# Patient Record
Sex: Male | Born: 1955 | Race: White | Hispanic: No | Marital: Married | Smoking: Never smoker
Health system: Southern US, Community
[De-identification: ages and names within clinical notes are randomized; demographics above are authoritative.]

## PROBLEM LIST (undated history)

## (undated) DIAGNOSIS — T8859XA Other complications of anesthesia, initial encounter: Secondary | ICD-10-CM

## (undated) DIAGNOSIS — G4700 Insomnia, unspecified: Secondary | ICD-10-CM

## (undated) DIAGNOSIS — N189 Chronic kidney disease, unspecified: Secondary | ICD-10-CM

## (undated) DIAGNOSIS — T7840XA Allergy, unspecified, initial encounter: Secondary | ICD-10-CM

## (undated) DIAGNOSIS — I340 Nonrheumatic mitral (valve) insufficiency: Secondary | ICD-10-CM

## (undated) DIAGNOSIS — E785 Hyperlipidemia, unspecified: Secondary | ICD-10-CM

## (undated) DIAGNOSIS — R112 Nausea with vomiting, unspecified: Secondary | ICD-10-CM

## (undated) DIAGNOSIS — H269 Unspecified cataract: Secondary | ICD-10-CM

## (undated) DIAGNOSIS — Z9889 Other specified postprocedural states: Secondary | ICD-10-CM

## (undated) HISTORY — DX: Allergy, unspecified, initial encounter: T78.40XA

## (undated) HISTORY — PX: VASECTOMY: SHX75

## (undated) HISTORY — PX: SHOULDER ARTHROSCOPY: SHX128

## (undated) HISTORY — PX: OTHER SURGICAL HISTORY: SHX169

## (undated) HISTORY — DX: Chronic kidney disease, unspecified: N18.9

## (undated) HISTORY — DX: Insomnia, unspecified: G47.00

## (undated) HISTORY — DX: Unspecified cataract: H26.9

## (undated) HISTORY — DX: Hyperlipidemia, unspecified: E78.5

## (undated) HISTORY — PX: TONSILLECTOMY: SUR1361

## (undated) HISTORY — PX: FOOT SURGERY: SHX648

---

## 2008-10-19 ENCOUNTER — Ambulatory Visit: Payer: Self-pay | Admitting: Cardiology

## 2008-10-19 ENCOUNTER — Observation Stay (HOSPITAL_COMMUNITY): Admission: EM | Admit: 2008-10-19 | Discharge: 2008-10-20 | Payer: Self-pay | Admitting: Emergency Medicine

## 2008-10-19 ENCOUNTER — Encounter: Payer: Self-pay | Admitting: Cardiology

## 2008-10-28 ENCOUNTER — Ambulatory Visit: Payer: Self-pay

## 2008-10-28 ENCOUNTER — Encounter: Payer: Self-pay | Admitting: Cardiology

## 2008-11-01 ENCOUNTER — Encounter: Payer: Self-pay | Admitting: Emergency Medicine

## 2008-11-10 DIAGNOSIS — E785 Hyperlipidemia, unspecified: Secondary | ICD-10-CM

## 2008-11-11 ENCOUNTER — Ambulatory Visit: Payer: Self-pay | Admitting: Emergency Medicine

## 2008-11-11 ENCOUNTER — Encounter: Payer: Self-pay | Admitting: Emergency Medicine

## 2008-11-11 DIAGNOSIS — R0602 Shortness of breath: Secondary | ICD-10-CM

## 2008-11-14 ENCOUNTER — Telehealth: Payer: Self-pay | Admitting: Cardiology

## 2008-11-22 DIAGNOSIS — R079 Chest pain, unspecified: Secondary | ICD-10-CM | POA: Insufficient documentation

## 2008-11-23 ENCOUNTER — Ambulatory Visit: Payer: Self-pay | Admitting: Cardiology

## 2009-01-17 ENCOUNTER — Encounter (INDEPENDENT_AMBULATORY_CARE_PROVIDER_SITE_OTHER): Payer: Self-pay | Admitting: *Deleted

## 2009-04-07 ENCOUNTER — Encounter: Payer: Self-pay | Admitting: Emergency Medicine

## 2010-08-14 LAB — CBC
MCHC: 34.3 g/dL (ref 30.0–36.0)
Platelets: 263 10*3/uL (ref 150–400)
RDW: 12.7 % (ref 11.5–15.5)

## 2010-08-14 LAB — BASIC METABOLIC PANEL
BUN: 12 mg/dL (ref 6–23)
Calcium: 8.6 mg/dL (ref 8.4–10.5)
Creatinine, Ser: 0.9 mg/dL (ref 0.4–1.5)
GFR calc non Af Amer: >60 mL/min (ref >60)
Potassium: 4.3 mEq/L (ref 3.5–5.1)

## 2010-08-14 LAB — POCT I-STAT 3, ART BLOOD GAS (G3+)
Acid-base deficit: 1 mmol/L (ref 0.0–2.0)
O2 Saturation: 88 %
TCO2: 25 mmol/L (ref 0–100)
pCO2 arterial: 39.6 mmHg (ref 35.0–45.0)
pO2, Arterial: 56 mmHg — ABNORMAL LOW (ref 80.0–100.0)

## 2010-08-14 LAB — COMPREHENSIVE METABOLIC PANEL
AST: 29 U/L (ref 0–37)
Albumin: 4.2 g/dL (ref 3.5–5.2)
Alkaline Phosphatase: 89 U/L (ref 39–117)
BUN: 11 mg/dL (ref 6–23)
Chloride: 106 mEq/L (ref 96–112)
Creatinine, Ser: 0.82 mg/dL (ref 0.4–1.5)
GFR calc Af Amer: >60 mL/min (ref >60)
Potassium: 4.2 mEq/L (ref 3.5–5.1)
Total Bilirubin: 0.4 mg/dL (ref 0.3–1.2)
Total Protein: 7 g/dL (ref 6.0–8.3)

## 2010-08-14 LAB — POCT CARDIAC MARKERS
CKMB, poc: <1 ng/mL — ABNORMAL LOW (ref 1.0–8.0)
Myoglobin, poc: 43.2 ng/mL (ref 12–200)
Troponin i, poc: <0.05 ng/mL (ref 0.00–0.09)
Troponin i, poc: <0.05 ng/mL (ref 0.00–0.09)

## 2010-08-14 LAB — LIPID PANEL
HDL: 37 mg/dL — ABNORMAL LOW (ref >39)
Total CHOL/HDL Ratio: 5.5 RATIO
Triglycerides: 132 mg/dL (ref <150)
VLDL: 26 mg/dL (ref 0–40)

## 2010-08-14 LAB — CARDIAC PANEL(CRET KIN+CKTOT+MB+TROPI)
CK, MB: 0.8 ng/mL (ref 0.3–4.0)
CK, MB: 1.1 ng/mL (ref 0.3–4.0)
Relative Index: INVALID (ref 0.0–2.5)
Total CK: 80 U/L (ref 7–232)
Troponin I: 0.02 ng/mL (ref 0.00–0.06)
Troponin I: 0.02 ng/mL (ref 0.00–0.06)

## 2010-08-14 LAB — DIFFERENTIAL
Basophils Absolute: 0 10*3/uL (ref 0.0–0.1)
Basophils Relative: 0 % (ref 0–1)
Lymphocytes Relative: 21 % (ref 12–46)
Neutro Abs: 5.1 10*3/uL (ref 1.7–7.7)

## 2010-08-14 LAB — BRAIN NATRIURETIC PEPTIDE: Pro B Natriuretic peptide (BNP): <30 pg/mL (ref 0.0–100.0)

## 2010-08-14 LAB — ETHANOL: Alcohol, Ethyl (B): <5 mg/dL (ref 0–10)

## 2010-08-14 LAB — TROPONIN I: Troponin I: 0.01 ng/mL (ref 0.00–0.06)

## 2010-08-14 LAB — CK TOTAL AND CKMB (NOT AT ARMC)
Relative Index: INVALID (ref 0.0–2.5)
Total CK: 76 U/L (ref 7–232)

## 2010-09-19 NOTE — Discharge Summary (Signed)
NAME:  Oscar [name], Oscar [name]             ACCOUNT NO.:  [id number]   MEDICAL RECORD NO.:  [id number]          PATIENT TYPE:  INP   LOCATION:  6525                         FACILITY:  [facility]   PHYSICIAN:  Arturo Morton. Riley Kill, MD, FACCDATE OF BIRTH:  Dec 24, 1955   DATE OF ADMISSION:  10/19/2008  DATE OF DISCHARGE:  10/20/2008                               DISCHARGE SUMMARY   PROCEDURES:  CT angiogram of the chest.   PRIMARY FINAL DISCHARGE DIAGNOSIS:  Shortness of breath.   SECONDARY DIAGNOSES:  1. Chest pain.  2. Hyperlipidemia with a total cholesterol of 204, triglycerides 132,      HDL 31, LDL 141.   TIME OF DISCHARGE:  32 minutes.   HOSPITAL COURSE:  Mr. Glazier is a 55 year old male with a history of  coronary artery disease.  He had some dyspnea on exertion, which  progressed to dyspnea with minimal exertion.  He also had some sharp  chest pain and came to the hospital where he was admitted for further  evaluation and treatment.  Because of his shortness of breath and a  concern for pulmonary embolism, a CT angiogram of the chest was  performed.  There was no evidence of acute pulmonary embolism or other  acute chest process.  A chest x-ray showed minimally enlarged cardiac  silhouette with no acute disease.  Lipid profile as described above.  His O2 saturation was 95% on room air.  A blood gas showed a pH of  7.386, pCO2 39.6, pO2 56, and bicarb of 23.7.   On October 20, 2008, his TSH was within normal limits.  His BNP was normal  and a D-dimer was less than 0.22.  Dr. Riley Kill was not clear on the  etiology of the dyspnea, but there were no obvious laboratory  abnormalities to explain it.  His hemoglobin and hematocrit were within  normal limits and his blood sugar was only minimally elevated of 102.  Dr. Riley Kill felt that Mr. Velaquez could be safely discharged home with  an outpatient stress echo and then follow up.   DISCHARGE INSTRUCTIONS:  1. His activity level is to be  increased gradually.  2. He is to stick to a low-sodium heart-healthy diet.  3. He is to follow up with Dr. Cleta Alberts as needed and with Dr. Riley Kill on      November 16, 2008 at 4:30.  4. He is to get a stress echo on October 28, 2008 at 11:30 a.m.   DISCHARGE MEDICATIONS:  Aspirin 81 mg daily.      Theodore Demark, PA-C      Arturo Morton. Riley Kill, MD, [facility]  Electronically Signed    RB/MEDQ  D:  10/20/2008  T:  10/20/2008  Job:  [id number]   cc:   Brett Canales A. Cleta Alberts, M.D.

## 2010-09-19 NOTE — H&P (Signed)
NAME:  Oscar [name], Oscar [name]             ACCOUNT NO.:  [id number]   MEDICAL RECORD NO.:  [id number]          PATIENT TYPE:  INP   LOCATION:  6525                         FACILITY:  [facility]   PHYSICIAN:  Arturo Morton. Riley Kill, MD, FACCDATE OF BIRTH:  1956-02-17   DATE OF ADMISSION:  10/19/2008  DATE OF DISCHARGE:                              HISTORY & PHYSICAL   PRIMARY CARDIOLOGIST:  Rocky Crafts. Riley Kill, MD, [facility]   PRIMARY CARE PHYSICIAN:  The patient does not have one.   HISTORY OF PRESENT ILLNESS:  This is a 55 year old Caucasian male with  no prior history of CAD or any other past medical history with  increasing fatigue and shortness of breath with exertion over the last 6  weeks, doing yard work; however, the patient does walk on a treadmill 4  times a week and does not have any trouble with chest discomfort or  fatigue at that time.  Upon going to bed last night, he could not catch  his breath, he took his shower, took some baby aspirin, the shortness of  breath did not ease.  He was unable to sleep secondary to this.  He  would sit up in bed or reposition himself, which did not help.  This  a.m., his breathing issue got a little worse and he came to the  emergency room after being seen at Urgent [facility] where his wife works.  EKG does not show any specific ST-T wave abnormalities.  He did have  some frequent PVCs.  Other than that the patient had some complaints of  jabbing in the center of his chest x1 week ago, was transient, and  currently he states this feels more like pressure each time he takes a  deep breath and cannot get enough air.  He is currently comfortable on 2  L, does have continued short shortness of breath and chest discomfort,  but does not appear to be acutely ill and is breathing and able to talk  without shortness of breath at this time.   REVIEW OF SYSTEMS:  Shortness of breath, dyspnea on exertion, fatigue,  lack of energy, and one episode of transient chest  pain.  All other  systems are reviewed and found to be negative.   PAST MEDICAL HISTORY:  Negative.   SOCIAL HISTORY:  He lives in [place] with his wife.  He works for  [employer] for 30 years.  He is married.  He does not smoke.  Occasional alcohol use.  No drug use.   FAMILY HISTORY:  Mother deceased with cancer.  Father alive with  prostate cancer.  He has a sibling, who smokes heavily, but has no  health issues.   CURRENT MEDICATIONS AT HOME:  None.   ALLERGIES:  None.   CURRENT LABORATORIES:  Sodium 140, potassium 4.2, chloride 106, CO2 of  26, glucose 109, BUN 11, creatinine 0.82, and calcium 9.0.  LFTs are  within normal limits.  Point-of-care troponin less than 0.05.  Chest x-  ray revealing mildly enlarged cardiac silhouette, and no flare up of  pulmonary edema, pneumonia, or pleural effusion.  EKG revealing normal  sinus rhythm with occasional PVCs and bigeminy.  No acute ST-T wave  changes are noted.   PHYSICAL EXAMINATION:  VITAL SIGNS:  Blood pressure 125/90, pulse 77,  respirations 20, he is afebrile, and O2 sat 93% on 2 L.  GENERAL:  He is awake, alert, and oriented.  No acute distress.  HEENT:  Head is normocephalic and atraumatic.  Eyes, PERRLA.  Mucous  membranes of mouth pink and moist.  Tongue is midline.  NECK:  Supple.  No JVD.  No carotid bruits are appreciated at present.  CARDIOVASCULAR:  Regular rate and rhythm without murmurs, rubs, or  gallops.  LUNGS:  Clear to auscultation without wheezes, rales, or rhonchi.  ABDOMEN:  Obese and nontender, 2+ bowel sounds.  EXTREMITIES:  Without clubbing, cyanosis, or edema.  NEUROLOGIC:  Cranial nerves II through XII are grossly intact.  PSYCH:  The patient minimizes pain, but has good affect.   IMPRESSION:  1. Dyspnea.  2. Chest pressure.   PLAN:  This is a 55 year old Caucasian male without prior history of CAD  with 6 weeks of increasing fatigue, dyspnea on exertion, and shortness  of breath  last night, prompting ER admit.  The patient does walk on a  treadmill 3-4 times a week with no associated symptoms.  However, this  afternoon, he has had some chest pressure.  Last evening, he had chest  pressure and not feeling like he can take a deep breath.  EKG shows some  inferior T-wave flattening with lateral flattening, which are  nonspecific.  The patient has been seen and examined by myself and Dr.  Shawnie Pons.  We will admit, risk stratification, and check lipids.  We will start aspirin, check ABG on room air, consider stress Myoview  versus cath versus CT scan.  The patient does not have any  cardiovascular risk factors at present.  We will do further testing and  make further recommendations throughout hospital course to include an  echocardiogram.      Bettey Mare. Lyman Bishop, NP      Arturo Morton. Riley Kill, MD, [facility]  Electronically Signed    KML/MEDQ  D:  10/19/2008  T:  10/20/2008  Job:  [id number]

## 2010-12-04 ENCOUNTER — Encounter: Payer: Self-pay | Admitting: Internal Medicine

## 2010-12-13 ENCOUNTER — Encounter: Payer: Self-pay | Admitting: Internal Medicine

## 2010-12-13 ENCOUNTER — Ambulatory Visit (AMBULATORY_SURGERY_CENTER): Payer: 59 | Admitting: *Deleted

## 2010-12-13 VITALS — Ht 74.0 in | Wt 236.2 lb

## 2010-12-13 DIAGNOSIS — Z1211 Encounter for screening for malignant neoplasm of colon: Secondary | ICD-10-CM

## 2010-12-13 MED ORDER — PEG-KCL-NACL-NASULF-NA ASC-C 100 G PO SOLR: 1.0000 | Freq: Once | ORAL | Status: DC

## 2010-12-18 ENCOUNTER — Telehealth: Payer: Self-pay | Admitting: Internal Medicine

## 2010-12-18 NOTE — Telephone Encounter (Signed)
Pt calls because no prep called yet to [facility]. I have call [facility] and ordered a Movie prep.

## 2010-12-26 ENCOUNTER — Telehealth: Payer: Self-pay | Admitting: Internal Medicine

## 2010-12-26 NOTE — Telephone Encounter (Signed)
No answer.  Message left on wife's voice mail. Oscar [name]

## 2010-12-26 NOTE — Telephone Encounter (Signed)
Pt's questions were answered about taking prep am of procedure. Oscar [name]

## 2010-12-27 ENCOUNTER — Ambulatory Visit (AMBULATORY_SURGERY_CENTER): Payer: 59 | Admitting: Internal Medicine

## 2010-12-27 ENCOUNTER — Encounter: Payer: Self-pay | Admitting: Internal Medicine

## 2010-12-27 DIAGNOSIS — D126 Benign neoplasm of colon, unspecified: Secondary | ICD-10-CM

## 2010-12-27 DIAGNOSIS — Z1211 Encounter for screening for malignant neoplasm of colon: Secondary | ICD-10-CM

## 2010-12-27 MED ORDER — SODIUM CHLORIDE 0.9 % IV SOLN: 500.0000 mL | INTRAVENOUS | Status: DC

## 2010-12-27 NOTE — Patient Instructions (Signed)
Discharge instructions reviewed with patient and care partner.  Impressions/Recommedations:  Polyps (information sheet given)  Please continue medications as you were taking them prior to your procedure.

## 2010-12-28 ENCOUNTER — Telehealth: Payer: Self-pay | Admitting: *Deleted

## 2010-12-28 NOTE — Telephone Encounter (Signed)

## 2011-01-01 ENCOUNTER — Encounter: Payer: Self-pay | Admitting: Internal Medicine

## 2011-06-18 ENCOUNTER — Ambulatory Visit (INDEPENDENT_AMBULATORY_CARE_PROVIDER_SITE_OTHER): Payer: 59 | Admitting: Physician Assistant

## 2011-06-18 VITALS — BP 124/79 | HR 86 | Temp 98.6°F | Resp 16 | Ht 73.0 in | Wt 224.0 lb

## 2011-06-18 DIAGNOSIS — G4700 Insomnia, unspecified: Secondary | ICD-10-CM

## 2011-06-18 DIAGNOSIS — M7710 Lateral epicondylitis, unspecified elbow: Secondary | ICD-10-CM

## 2011-06-18 MED ORDER — METHYLPREDNISOLONE ACETATE 80 MG/ML IJ SUSP
80.0000 mg | Freq: Once | INTRAMUSCULAR | Status: AC
  Administered 2011-06-18: 80 mg via INTRAMUSCULAR

## 2011-06-18 MED ORDER — PREDNISONE 20 MG PO TABS: ORAL_TABLET | ORAL | Status: AC

## 2011-06-18 MED ORDER — CLONAZEPAM 0.5 MG PO TABS: ORAL_TABLET | ORAL | Status: DC

## 2011-06-18 NOTE — Patient Instructions (Signed)
Lateral Epicondylitis (Tennis Elbow) with Rehab Lateral epicondylitis involves inflammation and pain around the outer portion of the elbow. The pain is caused by inflammation of the tendons in the forearm that bring back (extend) the wrist. Lateral epicondylittis is also called tennis elbow, because it is very common in tennis players. However, it may occur in any individual who extends the wrist repetitively. If lateral epicondylitis is left untreated, it may become a chronic problem. SYMPTOMS   Pain, tenderness, and inflammation on the outer (lateral) side of the elbow.   Pain or weakness with gripping activities.   Pain that increases with wrist twisting motions (playing tennis, using a screwdriver, opening a door or a jar).   Pain with lifting objects, including a coffee cup.  CAUSES  Lateral epicondylitis is caused by inflammation of the tendons that extend the wrist. Causes of injury may include:  Repetitive stress and strain on the muscles and tendons that extend the wrist.   Sudden change in activity level or intensity.   Incorrect grip in racquet sports.   Incorrect grip size of racquet (often too large).   Incorrect hitting position or technique (usually backhand, leading with the elbow).   Using a racket that is too heavy.  RISK INCREASES WITH:  Sports or occupations that require repetitive and/or strenuous forearm and wrist movements (tennis, squash, racquetball, carpentry).   Poor wrist and forearm strength and flexibility.   Failure to warm up properly before activity.   Resuming activity before healing, rehabilitation, and conditioning are complete.  PREVENTION   Warm up and stretch properly before activity.   Maintain physical fitness:   Strength, flexibility, and endurance.   Cardiovascular fitness.   Wear and use properly fitted equipment.   Learn and use proper technique and have a coach correct improper technique.   Wear a tennis elbow  (counterforce) brace.  PROGNOSIS  The course of this condition depends on the degree of the injury. If treated properly, acute cases (symptoms lasting less than 4 weeks) are often resolved in 2 to 6 weeks. Chronic (longer lasting cases) often resolve in 3 to 6 months, but may require physical therapy. RELATED COMPLICATIONS   Frequently recurring symptoms, resulting in a chronic problem. Properly treating the problem the first time decreases frequency of recurrence.   Chronic inflammation, scarring tendon degeneration, and partial tendon tear, requiring surgery.   Delayed healing or resolution of symptoms.  TREATMENT  Treatment first involves the use of ice and medicine, to reduce pain and inflammation. Strengthening and stretching exercises may help reduce discomfort, if performed regularly. These exercises may be performed at home, if the condition is an acute injury. Chronic cases may require a referral to a physical therapist for evaluation and treatment. Your caregiver may advise a corticosteroid injection, to help reduce inflammation. Rarely, surgery is needed. MEDICATION  If pain medicine is needed, nonsteroidal anti-inflammatory medicines (aspirin and ibuprofen), or other minor pain relievers (acetaminophen), are often advised.   Do not take pain medicine for 7 days before surgery.   Prescription pain relievers may be given, if your caregiver thinks they are needed. Use only as directed and only as much as you need.   Corticosteroid injections may be recommended. These injections should be reserved only for the most severe cases, because they can only be given a certain number of times.  HEAT AND COLD  Cold treatment (icing) should be applied for 10 to 15 minutes every 2 to 3 hours for inflammation and pain, and immediately   after activity that aggravates your symptoms. Use ice packs or an ice massage.   Heat treatment may be used before performing stretching and strengthening  activities prescribed by your caregiver, physical therapist, or athletic trainer. Use a heat pack or a warm water soak.  SEEK MEDICAL CARE IF: Symptoms get worse or do not improve in 2 weeks, despite treatment. EXERCISES  RANGE OF MOTION (ROM) AND STRETCHING EXERCISES - Epicondylitis, Lateral (Tennis Elbow) These exercises may help you when beginning to rehabilitate your injury. Your symptoms may go away with or without further involvement from your physician, physical therapist or athletic trainer. While completing these exercises, remember:   Restoring tissue flexibility helps normal motion to return to the joints. This allows healthier, less painful movement and activity.   An effective stretch should be held for at least 30 seconds.   A stretch should never be painful. You should only feel a gentle lengthening or release in the stretched tissue.  RANGE OF MOTION - Wrist Flexion, Active-Assisted  Extend your right / left elbow with your fingers pointing down.*   Gently pull the back of your hand towards you, until you feel a gentle stretch on the top of your forearm.   Hold this position for __________ seconds.  Repeat __________ times. Complete this exercise __________ times per day.  *If directed by your physician, physical therapist or athletic trainer, complete this stretch with your elbow bent, rather than extended. RANGE OF MOTION - Wrist Extension, Active-Assisted  Extend your right / left elbow and turn your palm upwards.*   Gently pull your palm and fingertips back, so your wrist extends and your fingers point more toward the ground.   You should feel a gentle stretch on the inside of your forearm.   Hold this position for __________ seconds.  Repeat __________ times. Complete this exercise __________ times per day. *If directed by your physician, physical therapist or athletic trainer, complete this stretch with your elbow bent, rather than extended. STRETCH - Wrist  Flexion  Place the back of your right / left hand on a tabletop, leaving your elbow slightly bent. Your fingers should point away from your body.   Gently press the back of your hand down onto the table by straightening your elbow. You should feel a stretch on the top of your forearm.   Hold this position for __________ seconds.  Repeat __________ times. Complete this stretch __________ times per day.  STRETCH - Wrist Extension   Place your right / left fingertips on a tabletop, leaving your elbow slightly bent. Your fingers should point backwards.   Gently press your fingers and palm down onto the table by straightening your elbow. You should feel a stretch on the inside of your forearm.   Hold this position for __________ seconds.  Repeat __________ times. Complete this stretch __________ times per day.  STRENGTHENING EXERCISES - Epicondylitis, Lateral (Tennis Elbow) These exercises may help you when beginning to rehabilitate your injury. They may resolve your symptoms with or without further involvement from your physician, physical therapist or athletic trainer. While completing these exercises, remember:   Muscles can gain both the endurance and the strength needed for everyday activities through controlled exercises.   Complete these exercises as instructed by your physician, physical therapist or athletic trainer. Increase the resistance and repetitions only as guided.   You may experience muscle soreness or fatigue, but the pain or discomfort you are trying to eliminate should never worsen during these exercises. If   this pain does get worse, stop and make sure you are following the directions exactly. If the pain is still present after adjustments, discontinue the exercise until you can discuss the trouble with your caregiver.  STRENGTH - Wrist Flexors  Sit with your right / left forearm palm-up and fully supported on a table or countertop. Your elbow should be resting below the  height of your shoulder. Allow your wrist to extend over the edge of the surface.   Loosely holding a __________ weight, or a piece of rubber exercise band or tubing, slowly curl your hand up toward your forearm.   Hold this position for __________ seconds. Slowly lower the wrist back to the starting position in a controlled manner.  Repeat __________ times. Complete this exercise __________ times per day.  STRENGTH - Wrist Extensors  Sit with your right / left forearm palm-down and fully supported on a table or countertop. Your elbow should be resting below the height of your shoulder. Allow your wrist to extend over the edge of the surface.   Loosely holding a __________ weight, or a piece of rubber exercise band or tubing, slowly curl your hand up toward your forearm.   Hold this position for __________ seconds. Slowly lower the wrist back to the starting position in a controlled manner.  Repeat __________ times. Complete this exercise __________ times per day.  STRENGTH - Ulnar Deviators  Stand with a ____________________ weight in your right / left hand, or sit while holding a rubber exercise band or tubing, with your healthy arm supported on a table or countertop.   Move your wrist, so that your pinkie travels toward your forearm and your thumb moves away from your forearm.   Hold this position for __________ seconds and then slowly lower the wrist back to the starting position.  Repeat __________ times. Complete this exercise __________ times per day STRENGTH - Radial Deviators  Stand with a ____________________ weight in your right / left hand, or sit while holding a rubber exercise band or tubing, with your injured arm supported on a table or countertop.   Raise your hand upward in front of you or pull up on the rubber tubing.   Hold this position for __________ seconds and then slowly lower the wrist back to the starting position.  Repeat __________ times. Complete this  exercise __________ times per day. STRENGTH - Forearm Supinators   Sit with your right / left forearm supported on a table, keeping your elbow below shoulder height. Rest your hand over the edge, palm down.   Gently grip a hammer or a soup ladle.   Without moving your elbow, slowly turn your palm and hand upward to a "thumbs-up" position.   Hold this position for __________ seconds. Slowly return to the starting position.  Repeat __________ times. Complete this exercise __________ times per day.  STRENGTH - Forearm Pronators   Sit with your right / left forearm supported on a table, keeping your elbow below shoulder height. Rest your hand over the edge, palm up.   Gently grip a hammer or a soup ladle.   Without moving your elbow, slowly turn your palm and hand upward to a "thumbs-up" position.   Hold this position for __________ seconds. Slowly return to the starting position.  Repeat __________ times. Complete this exercise __________ times per day.  STRENGTH - Grip  Grasp a tennis ball, a dense sponge, or a large, rolled sock in your hand.   Squeeze as hard   as you can, without increasing any pain.   Hold this position for __________ seconds. Release your grip slowly.  Repeat __________ times. Complete this exercise __________ times per day.  STRENGTH - Elbow Extensors, Isometric  Stand or sit upright, on a firm surface. Place your right / left arm so that your palm faces your stomach, and it is at the height of your waist.   Place your opposite hand on the underside of your forearm. Gently push up as your right / left arm resists. Push as hard as you can with both arms, without causing any pain or movement at your right / left elbow. Hold this stationary position for __________ seconds.  Gradually release the tension in both arms. Allow your muscles to relax completely before repeating. Document Released: 04/23/2005 Document Revised: 01/03/2011 Document Reviewed:  08/05/2008 [employer] Patient Information 2012 [employer]. 

## 2011-06-18 NOTE — Progress Notes (Signed)
Patient ID: Oscar [name] MRN: [id number], DOB: Aug 07, 1955, 56 y.o. Date of Encounter: 06/18/2011, 8:32 AM  Primary Physician: No primary provider on file.  Chief Complaint: Insomnia, long standing and left elbow pain for 9 months  HPI: 56 year old Caucasian male with history below presents with 2 complaints. 1. Long standing history of insomnia. Has been worsening over the past month. Getting 2-3 hours of sleep per night. Sometimes difficultly falling asleep and other times difficulty staying asleep. Has tried Ambien 5 mg, 10 mg, 12.5 mg, Lunesta, and OTC products without success. The OTC products leave him feeling groggy in the morning. His insomnia is causing his spouse to lose sleep at this point as well. Requests different treatment.  2. Patient also mentions 9 month history of off and on right lateral elbow discomfort. He attributes this discomfort to moving numerous 10-15 pound rocks with his father 9 months prior. Since that time if he utilizes the elbow a lot during the day it becomes sore. If he does not use it much there is not pain associated with the elbow. Has tried Motrin and a sock used as a tennis elbow strap with good results, but has not stuck with the program. Does not have any bone pain. Has full range of motion. No wound.    Past Medical History  Diagnosis Date  . Insomnia   . Hyperlipidemia      Home Meds: Prior to Admission medications   Medication Sig Start Date End Date Taking? Authorizing Provider  clonazePAM (KLONOPIN) 0.5 MG tablet 1/2 to 2 po qhs prn insomnia 06/18/11   [name], PA-C  Multiple Vitamin (MULTIVITAMIN) tablet Take 1 tablet by mouth daily.   Yes Historical Provider, MD  Omega-3 Fatty Acids (FISH OIL) 500 MG CAPS Take by mouth daily.   Yes Historical Provider, MD  predniSONE (DELTASONE) 20 MG tablet 3 PO FOR 3 DAYS, 2 PO FOR 3 DAYS, 1 PO FOR 3 DAYS 06/18/11 06/28/11  [name], PA-C    Allergies: No Known Allergies  History   Social History    . Marital Status: Married    Spouse Name: N/A    Number of Children: N/A  . Years of Education: N/A   Occupational History  . Not on file.   Social History Main Topics  . Smoking status: Never Smoker   . Smokeless tobacco: Not on file  . Alcohol Use: Not on file  . Drug Use: Not on file  . Sexually Active: Not on file   Other Topics Concern  . Not on file   Social History Narrative  . No narrative on file     Review of Systems: Constitutional: negative for chills, fever, night sweats, weight changes, or fatigue  HEENT: negative for vision changes, hearing loss, congestion, rhinorrhea, ST, epistaxis, or sinus pressure Cardiovascular: negative for chest pain or palpitations Respiratory: negative for hemoptysis, wheezing, shortness of breath, or cough Abdominal: negative for abdominal pain, nausea, vomiting, diarrhea, or constipation Dermatological: negative for rash Neurologic: negative for headache, dizziness, or syncope All other systems reviewed and are otherwise negative with the exception to those above and in the HPI.   Physical Exam: Blood pressure 124/79, pulse 86, temperature 98.6 F (37 C), temperature source Oral, resp. rate 16, height 6\' 1"  (1.854 m), weight 224 lb (101.606 kg). Body mass index is 29.55 kg/(m^2). General: Well developed, well nourished, in no acute distress. Head: Normocephalic, atraumatic, eyes without discharge, sclera non-icteric, nares are without discharge. Bilateral auditory  canals clear, TM's are without perforation, pearly grey and translucent with reflective cone of light bilaterally. Oral cavity moist, posterior pharynx without exudate, erythema, peritonsillar abscess, or post nasal drip.  Neck: Supple. No thyromegaly. Full ROM. No lymphadenopathy. Lungs: Breathing is unlabored. Heart: Normal rate Msk:  Strength and tone normal for age. Right elbow: No wound, FROM, 5/5 strength, no tenderness to palpation. Patient states his discomfort  is located along right anterior portion of the lateral epicondyle.  Extremities/Skin: Warm and dry. No clubbing or cyanosis. No edema. No rashes or suspicious lesions. Normal sensation. Neuro: Alert and oriented X 3. Moves all extremities spontaneously. Gait is normal. CNII-XII grossly in tact. Psych:  Responds to questions appropriately with a normal affect.   X-ray: Declined  ASSESSMENT AND PLAN:  56 y.o. Caucasian male with insomnia and lateral epicondylitis. 1. Insomnia -Klonopin 0.5 mg #60 1/2 to 1 po qhs prn insomnia no rf SED -Good sleep hygiene -Call with update  2. Lateral epicondylitis -DepoMedrol 80 mg IM -Prednisone 20 mg #18 3x3, 2x3, 1x3 RF#1 SED -Tennis elbow strap -ROM exercises -If persists recommend PT and plain films   Signed,  [name], PA-C 06/18/2011 8:32 AM

## 2011-08-01 ENCOUNTER — Other Ambulatory Visit: Payer: Self-pay | Admitting: Physician Assistant

## 2011-08-01 MED ORDER — CLONAZEPAM 0.5 MG PO TABS: ORAL_TABLET | ORAL | Status: DC

## 2011-08-01 MED ORDER — AMITRIPTYLINE HCL 25 MG PO TABS: 25.0000 mg | ORAL_TABLET | Freq: Every day | ORAL | Status: DC

## 2011-08-01 NOTE — Telephone Encounter (Signed)
See scanned in refill request. Patient requests refill of Klonopin and added medication to help him stay asleep. Will have a trial of continued Klonopin and add Elavil.   [name], PA-C 08/01/2011 2:28 PM

## 2011-08-15 ENCOUNTER — Encounter: Payer: Self-pay | Admitting: Internal Medicine

## 2011-09-04 ENCOUNTER — Other Ambulatory Visit: Payer: Self-pay | Admitting: Physician Assistant

## 2011-09-04 MED ORDER — ZOLPIDEM TARTRATE ER 12.5 MG PO TBCR: 12.5000 mg | EXTENDED_RELEASE_TABLET | Freq: Every evening | ORAL | Status: DC | PRN

## 2011-09-04 MED ORDER — CLONAZEPAM 0.5 MG PO TABS: ORAL_TABLET | ORAL | Status: DC

## 2011-09-04 NOTE — Progress Notes (Signed)
Patient's wife states he would like a refill of both his Klonopin 0.5 mg and Ambien 10 mg. He will take the Klonopin 0.5 mg 2 po qhs for about 4 days, then change over to the Ambien 10 mg for the next couple of days. He is not taking them together. This combination is working well for him. The Elavil made him to tired in the morning. He is planning to come in for a physical in July 2013.  Please call in both medications to the [facility]  [name], PA-C 09/04/2011 9:20 AM

## 2011-11-07 ENCOUNTER — Other Ambulatory Visit: Payer: Self-pay

## 2011-11-07 MED ORDER — CLONAZEPAM 0.5 MG PO TABS: ORAL_TABLET | ORAL | Status: DC

## 2011-11-19 ENCOUNTER — Ambulatory Visit: Payer: 59 | Admitting: Physician Assistant

## 2011-11-19 ENCOUNTER — Encounter: Payer: Self-pay | Admitting: Physician Assistant

## 2011-11-19 VITALS — BP 127/77 | HR 90 | Temp 97.8°F | Resp 16 | Ht 73.0 in | Wt 232.0 lb

## 2011-11-19 DIAGNOSIS — Z0000 Encounter for general adult medical examination without abnormal findings: Secondary | ICD-10-CM

## 2011-11-19 LAB — LIPID PANEL
HDL: 56 mg/dL (ref >39)
Total CHOL/HDL Ratio: 3.8 Ratio
VLDL: 19 mg/dL (ref 0–40)

## 2011-11-19 LAB — CBC
HCT: 46.7 % (ref 39.0–52.0)
MCH: 33.9 pg (ref 26.0–34.0)
MCHC: 35.8 g/dL (ref 30.0–36.0)
MCV: 94.9 fL (ref 78.0–100.0)
Platelets: 248 10*3/uL (ref 150–400)
RDW: 13 % (ref 11.5–15.5)
WBC: 5.9 10*3/uL (ref 4.0–10.5)

## 2011-11-19 LAB — COMPREHENSIVE METABOLIC PANEL
ALT: 60 U/L — ABNORMAL HIGH (ref 0–53)
AST: 34 U/L (ref 0–37)
Alkaline Phosphatase: 78 U/L (ref 39–117)
BUN: 15 mg/dL (ref 6–23)
Creat: 0.86 mg/dL (ref 0.50–1.35)
Total Bilirubin: 0.6 mg/dL (ref 0.3–1.2)

## 2011-11-19 LAB — POCT UA - MICROSCOPIC ONLY
Casts, Ur, LPF, POC: NEGATIVE
Crystals, Ur, HPF, POC: NEGATIVE
Epithelial cells, urine per micros: NEGATIVE

## 2011-11-19 LAB — POCT URINALYSIS DIPSTICK
Blood, UA: NEGATIVE
Protein, UA: NEGATIVE
Spec Grav, UA: 1.02
Urobilinogen, UA: 0.2
pH, UA: 5.5

## 2011-11-19 NOTE — Progress Notes (Signed)
Patient ID: Oscar [name] MRN: [id number], DOB: 1955-09-19 56 y.o. Date of Encounter: 11/19/2011, 2:13 PM  Primary Physician: No primary provider on file.  Chief Complaint: Physical (CPE)  HPI: 56 y.o. y/o male with history noted below here for CPE. Doing well. No issues/complaints. Insomnia is improving. Taking Klonopin almost nightly, but alternates every two or three nights with Ambien. When he takes Ambien he does not take the Klonopin. Also taking OTC vitamin B6, B12, and Zinc to help with sleeping. He states this combination of vitamins helps quite a lot with his sleep hygiene. He does not currently need a refill of medication.   Had colonoscopy in 2012, normal.   See scanned in blue sheet.  Review of Systems: Consitutional: No fever, chills, fatigue, night sweats, lymphadenopathy, or weight changes. Eyes: No visual changes, eye redness, or discharge. ENT/Mouth: Ears: No otalgia, tinnitus, hearing loss, discharge. Nose: No congestion, rhinorrhea, sinus pain, or epistaxis. Throat: No sore throat, post nasal drip, or teeth pain. Cardiovascular: No CP, palpitations, diaphoresis, DOE, edema, orthopnea, PND. Respiratory: No cough, hemoptysis, SOB, or wheezing. Gastrointestinal: No anorexia, dysphagia, reflux, pain, nausea, vomiting, hematemesis, diarrhea, constipation, BRBPR, or melena. Genitourinary: No dysuria, frequency, urgency, hematuria, incontinence, nocturia, decreased urinary stream, discharge, impotence, or testicular pain/masses. Musculoskeletal: No decreased ROM, myalgias, stiffness, joint swelling, or weakness. Skin: No rash, erythema, lesion changes, pain, warmth, jaundice, or pruritis. Neurological: No headache, dizziness, syncope, seizures, tremors, memory loss, coordination problems, or paresthesias. Psychological: No anxiety, depression, hallucinations, SI/HI. Endocrine: No fatigue, polydipsia, polyphagia, polyuria, or known diabetes. All other systems were reviewed  and are otherwise negative.  Past Medical History  Diagnosis Date  . Allergy   . Chronic kidney disease     [id number]  . Insomnia   . Hyperlipidemia      Past Surgical History  Procedure Date  . Thumb surgery     right  . Foot surgery     right  . Shoulder arthroscopy     Home Meds:  Prior to Admission medications   Medication Sig Start Date End Date Taking? Authorizing Provider  Ascorbic Acid (VITAMIN C) 1000 MG tablet Take 1,000 mg by mouth daily.     Yes Historical Provider, MD  clonazePAM (KLONOPIN) 0.5 MG tablet 1-2 po qhs prn insomnia. 11/07/11  Yes Quintavius Niebuhr M Kunio Cummiskey, PA-C  GLUCOSAMINE-CHONDROITIN PO Take 1 tablet by mouth daily.     Yes Historical Provider, MD  Misc Natural Products (GINSENG COMPLEX PO) Take 1 tablet by mouth daily.     Yes Historical Provider, MD  Multiple Vitamin (MULTIVITAMIN PO) Take 1 tablet by mouth daily.     Yes Historical Provider, MD  Multiple Vitamin (MULTIVITAMIN) tablet Take 1 tablet by mouth daily.   Yes Historical Provider, MD  Omega-3 Fatty Acids (FISH OIL PO) Take 1 tablet by mouth daily.     Yes Historical Provider, MD  Omega-3 Fatty Acids (FISH OIL) 500 MG CAPS Take by mouth daily.   Yes Historical Provider, MD  Saw Palmetto, Serenoa repens, (SAW PALMETTO PO) Take 1 tablet by mouth daily.     Yes Historical Provider, MD  VITAMIN E PO Take 1 tablet by mouth daily.     Yes Historical Provider, MD  FLUTICASONE PROPIONATE, INHAL, IN Inhale 1 spray into the lungs as needed.      Historical Provider, MD  peg 3350 powder (MOVIPREP) 100 G SOLR Take 1 kit (100 g total) by mouth once. 12/13/10   Beverley Fiedler, MD  zolpidem (AMBIEN CR) 12.5 MG CR tablet Take 1 tablet (12.5 mg total) by mouth at bedtime as needed. 09/04/11 10/04/11  Sondra Barges, PA-C    Allergies:  Allergies  Allergen Reactions  . Poison Ivy Treatments (Poison Ivy Treatments)     Allergic to poison ivy  . Pollen Extract     History   Social History  . Marital Status: Married     Spouse Name: N/A    Number of Children: N/A  . Years of Education: N/A   Occupational History  . Not on file.   Social History Main Topics  . Smoking status: Never Smoker   . Smokeless tobacco: Not on file  . Alcohol Use: 8.4 oz/week    14 Cans of beer per week  . Drug Use: No  . Sexually Active:    Other Topics Concern  . Not on file   Social History Narrative   ** Merged History Encounter **     Family History  Problem Relation Age of Onset  . Colon cancer Neg Hx   . Esophageal cancer Neg Hx   . Stomach cancer Neg Hx     Physical Exam: Blood pressure 127/77, pulse 90, temperature 97.8 F (36.6 C), temperature source Oral, resp. rate 16, height 6\' 1"  (1.854 m), weight 232 lb (105.235 kg).  General: Well developed, well nourished, in no acute distress. HEENT: Normocephalic, atraumatic. Conjunctiva pink, sclera non-icteric. Pupils 2 mm constricting to 1 mm, round, regular, and equally reactive to light and accomodation. EOMI. Internal auditory canal clear. TMs with good cone of light and without pathology. Nasal mucosa pink. Nares are without discharge. No sinus tenderness. Oral mucosa pink. Dentition normal. Pharynx without exudate.   Neck: Supple. Trachea midline. No thyromegaly. Full ROM. No lymphadenopathy. Lungs: Clear to auscultation bilaterally without wheezes, rales, or rhonchi. Breathing is of normal effort and unlabored. Cardiovascular: RRR with S1 S2. No murmurs, rubs, or gallops appreciated. Distal pulses 2+ symmetrically. No carotid or abdominal bruits. Abdomen: Soft, non-tender, non-distended with normoactive bowel sounds. No hepatosplenomegaly or masses. No rebound/guarding. No CVA tenderness. Without hernias.  Rectal: No external hemorrhoids or fissures. Rectal vault without masses. Prostate smooth, symmetrical, and without enlargement.  Genitourinary: Circumcised male. No penile lesions. Testes descended bilaterally, and smooth without tenderness or masses.    Musculoskeletal: Full range of motion and 5/5 strength throughout. Without swelling, atrophy, tenderness, crepitus, or warmth. Extremities without clubbing, cyanosis, or edema. Calves supple. Skin: Warm and moist without erythema, ecchymosis, wounds, or rash. Neuro: A+Ox3. CN II-XII grossly intact. Moves all extremities spontaneously. Full sensation throughout. Normal gait. DTR 2+ throughout upper and lower extremities. Finger to nose intact. Psych:  Responds to questions appropriately with a normal affect.   Studies:  Results for orders placed in visit on 11/19/11  IFOBT (OCCULT BLOOD)      Component Value Range   IFOBT Negative    POCT UA - MICROSCOPIC ONLY      Component Value Range   WBC, Ur, HPF, POC 1-4     RBC, urine, microscopic 0-1     Bacteria, U Microscopic trace     Mucus, UA pos     Epithelial cells, urine per micros neg     Crystals, Ur, HPF, POC neg     Casts, Ur, LPF, POC neg     Yeast, UA neg    POCT URINALYSIS DIPSTICK      Component Value Range   Color, UA yellow  Clarity, UA clear     Glucose, UA neg     Bilirubin, UA neg     Ketones, UA neg     Spec Grav, UA 1.020     Blood, UA neg     pH, UA 5.5     Protein, UA neg     Urobilinogen, UA 0.2     Nitrite, UA neg     Leukocytes, UA Negative     CBC, CMP, Lipid, PSA, and TSH all pending. Patient is fasting.  Assessment/Plan:  56 y.o. y/o Caucasian male here for CPE with improving insomnia. 1. Insomnia -Improving -Continue current treatment -Call when needs refill, ok to fill for one year -If insomnia worsens consider CBT vs other medication 2. CPE -Vaccines up to date -Colonoscopy, 2012 normal -Healthy diet and exercise -Anticipatory guidance  Signed, [name], PA-C 11/19/2011 2:13 PM

## 2011-11-20 ENCOUNTER — Encounter: Payer: Self-pay | Admitting: Physician Assistant

## 2011-11-20 ENCOUNTER — Encounter: Payer: Self-pay | Admitting: Radiology

## 2011-11-20 LAB — TSH: TSH: 1.131 u[IU]/mL (ref 0.350–4.500)

## 2011-11-20 LAB — PSA: PSA: 2.07 ng/mL (ref <=4.00)

## 2011-12-30 ENCOUNTER — Telehealth: Payer: Self-pay | Admitting: Physician Assistant

## 2011-12-30 MED ORDER — CLONAZEPAM 0.5 MG PO TABS: ORAL_TABLET | ORAL | Status: DC

## 2011-12-30 NOTE — Telephone Encounter (Signed)
Done

## 2011-12-30 NOTE — Telephone Encounter (Signed)
rx printed. Call/fax to [facility]

## 2012-02-23 ENCOUNTER — Other Ambulatory Visit: Payer: Self-pay | Admitting: *Deleted

## 2012-02-23 ENCOUNTER — Other Ambulatory Visit: Payer: Self-pay | Admitting: Physician Assistant

## 2012-02-26 ENCOUNTER — Ambulatory Visit (INDEPENDENT_AMBULATORY_CARE_PROVIDER_SITE_OTHER): Payer: 59 | Admitting: Internal Medicine

## 2012-02-26 VITALS — BP 118/82 | HR 89 | Temp 98.3°F | Resp 16 | Ht 73.0 in | Wt 235.4 lb

## 2012-02-26 DIAGNOSIS — R059 Cough, unspecified: Secondary | ICD-10-CM

## 2012-02-26 DIAGNOSIS — J019 Acute sinusitis, unspecified: Secondary | ICD-10-CM

## 2012-02-26 DIAGNOSIS — J329 Chronic sinusitis, unspecified: Secondary | ICD-10-CM

## 2012-02-26 DIAGNOSIS — R05 Cough: Secondary | ICD-10-CM

## 2012-02-26 MED ORDER — FLUTICASONE PROPIONATE 50 MCG/ACT NA SUSP: 2.0000 | Freq: Two times a day (BID) | NASAL | Status: DC

## 2012-02-26 MED ORDER — AMOXICILLIN 500 MG PO CAPS: 1000.0000 mg | ORAL_CAPSULE | Freq: Two times a day (BID) | ORAL | Status: AC

## 2012-02-26 MED ORDER — AMOXICILLIN 500 MG PO CAPS: 1000.0000 mg | ORAL_CAPSULE | Freq: Two times a day (BID) | ORAL | Status: DC

## 2012-02-26 NOTE — Progress Notes (Signed)
  Subjective:    Patient ID: Oscar [name], male    DOB: [birth year], 56 y.o.   MRN: [id number]  HPINonproductive cough, intermittent sore throat, marked nasal congestion, fatigue, intermittent low-grade fever, all progressing over 3 weeks Multiple over-the-counter medication shot with no success Trip to [place] in 4 days   Review of Systems     Objective:   Physical Exam TMs clear Conjunctiva clear Nares boggy with purulent Throat clear/no nodes Chest clear to auscultation       Assessment & Plan:  Problem #1 sinusitis Problem #2 cough  Meds ordered this encounter  Medications  . fluticasone (FLONASE) 50 MCG/ACT nasal spray    Sig: Place 2 sprays into the nose 2 (two) times daily.    Dispense:  16 g    Refill:  0  . amoxicillin (AMOXIL) 500 MG capsule    Sig: Take 2 capsules (1,000 mg total) by mouth 2 (two) times daily.    Dispense:  40 capsule    Refill:  0

## 2012-03-10 ENCOUNTER — Telehealth: Payer: Self-pay

## 2012-03-10 MED ORDER — AMOXICILLIN-POT CLAVULANATE 875-125 MG PO TABS: 1.0000 | ORAL_TABLET | Freq: Two times a day (BID) | ORAL | Status: DC

## 2012-03-10 NOTE — Telephone Encounter (Signed)
Augmentin 10 d per Dr Merla Riches, was sent in to his pharmacy, called patient to advise.

## 2012-03-10 NOTE — Telephone Encounter (Signed)
Patient was prescribed amoxycillin and symptoms improved while taking it. After last dosage symptoms returned (fever/cold sweats, congestion, lethargy). Patient is wondering whether he should take another round of antibiotics or whether something different should be prescribed.  Best [phone] or [phone] Or talk to [employer]

## 2012-03-10 NOTE — Telephone Encounter (Signed)
Patient was prescribed amoxycilin and symptoms improved while taking it. After last dosage symptoms returned: fever/cold sweats, congestion, lethargy. Patient is wondering whether he should take another round of antibiotics.  Best [phone] or [phone] Or you can talk to [employer]

## 2012-05-12 ENCOUNTER — Encounter: Payer: Self-pay | Admitting: Physician Assistant

## 2012-05-12 ENCOUNTER — Ambulatory Visit: Payer: 59 | Admitting: Physician Assistant

## 2012-05-12 ENCOUNTER — Ambulatory Visit (INDEPENDENT_AMBULATORY_CARE_PROVIDER_SITE_OTHER): Payer: 59 | Admitting: Emergency Medicine

## 2012-05-12 VITALS — BP 122/80 | HR 85 | Temp 98.7°F | Resp 18 | Ht 73.0 in | Wt 237.8 lb

## 2012-05-12 DIAGNOSIS — G4700 Insomnia, unspecified: Secondary | ICD-10-CM

## 2012-05-12 DIAGNOSIS — F419 Anxiety disorder, unspecified: Secondary | ICD-10-CM

## 2012-05-12 DIAGNOSIS — R972 Elevated prostate specific antigen [PSA]: Secondary | ICD-10-CM

## 2012-05-12 LAB — PSA: PSA: 2.22 ng/mL (ref <=4.00)

## 2012-05-12 MED ORDER — ZOLPIDEM TARTRATE ER 12.5 MG PO TBCR: 12.5000 mg | EXTENDED_RELEASE_TABLET | Freq: Every evening | ORAL | Status: DC | PRN

## 2012-05-12 MED ORDER — CLONAZEPAM 0.5 MG PO TABS: 0.5000 mg | ORAL_TABLET | Freq: Two times a day (BID) | ORAL | Status: DC | PRN

## 2012-05-13 DIAGNOSIS — R972 Elevated prostate specific antigen [PSA]: Secondary | ICD-10-CM | POA: Insufficient documentation

## 2012-05-13 NOTE — Progress Notes (Signed)
  Subjective:    Patient ID: Oscar [name], male    DOB: [birth year], 57 y.o.   MRN: [id number]  HPI patient enters for followup on [name]. He also needs refills on his Klonopin and [place]. He is under a great deal of stress related to his divorce and alimony payments and  issues related to this.    Review of Systems     Objective:   Physical Exam patient is alert and cooperative. Blood pressure cervical chest clear to auscultation and percussion cardiac regular rate and rhythm        Assessment & Plan:  PSA was checked today. I refill his Ambien and Klonopin for 6 months the

## 2012-05-15 ENCOUNTER — Other Ambulatory Visit: Payer: Self-pay | Admitting: Emergency Medicine

## 2012-05-15 DIAGNOSIS — G4700 Insomnia, unspecified: Secondary | ICD-10-CM

## 2012-05-15 MED ORDER — ZOLPIDEM TARTRATE 10 MG PO TABS: 10.0000 mg | ORAL_TABLET | Freq: Every evening | ORAL | Status: DC | PRN

## 2012-11-21 ENCOUNTER — Other Ambulatory Visit: Payer: Self-pay | Admitting: Emergency Medicine

## 2012-12-16 ENCOUNTER — Encounter: Payer: Self-pay | Admitting: Emergency Medicine

## 2012-12-16 ENCOUNTER — Ambulatory Visit (INDEPENDENT_AMBULATORY_CARE_PROVIDER_SITE_OTHER): Payer: 59 | Admitting: Emergency Medicine

## 2012-12-16 VITALS — BP 130/78 | HR 84 | Temp 97.5°F | Resp 16 | Ht 74.0 in | Wt 238.0 lb

## 2012-12-16 DIAGNOSIS — R972 Elevated prostate specific antigen [PSA]: Secondary | ICD-10-CM

## 2012-12-16 DIAGNOSIS — G4700 Insomnia, unspecified: Secondary | ICD-10-CM

## 2012-12-16 MED ORDER — CLONAZEPAM 1 MG PO TABS: 1.0000 mg | ORAL_TABLET | Freq: Every evening | ORAL | Status: DC | PRN

## 2012-12-16 MED ORDER — TRAZODONE HCL 50 MG PO TABS: 50.0000 mg | ORAL_TABLET | Freq: Every day | ORAL | Status: DC

## 2012-12-16 NOTE — Progress Notes (Signed)
  Subjective:    Patient ID: Oscar [name], male    DOB: May 22, 1955, 57 y.o.   MRN: [id number]  HPI patient enters primarily because he is unable to sleep. He has tried Klonopin 2 tablets at night of the 0.5 dose he has tried Ambien 10 mg and has taken up to 1-1/2 tablets at night. He continues to have issues and difficulty sleeping. I told him he could not take Klonopin and Ambien together. He has tried melatonin in the past. His wife is interested in him possibly trying trazodone. He also has had a slowly rising PSA and is in today primarily to have this rechecked.    Review of Systems     Objective:   Physical Exam patient is alert and cooperative he is not in any distress. His neck is supple. His chest is clear. Heart regular rate no murmurs. Abdomen is soft nontender.        Assessment & Plan:  We will start with trazodone 50 mg one at bedtime. After 3-4 nights if he is unable to sleep he can add 1 mg of Klonopin to take with the trazodone for sleep. If this is unsuccessful he can stop that current regimen and try Ambien 12.5 one a day and #30 of these can be called in .

## 2012-12-23 ENCOUNTER — Telehealth: Payer: Self-pay

## 2012-12-23 NOTE — Telephone Encounter (Signed)
PT WOULD LIKE TO HAVE SOME AMBIEN CALLED IN. PLEASE CALL [phone]    [facility]

## 2012-12-24 MED ORDER — ZOLPIDEM TARTRATE ER 12.5 MG PO TBCR: 12.5000 mg | EXTENDED_RELEASE_TABLET | Freq: Every evening | ORAL | Status: DC | PRN

## 2012-12-24 NOTE — Telephone Encounter (Signed)
Rx printed.  Meds ordered this encounter  Medications  . zolpidem (AMBIEN CR) 12.5 MG CR tablet    Sig: Take 1 tablet (12.5 mg total) by mouth at bedtime as needed for sleep.    Dispense:  30 tablet    Refill:  0    Order Specific Question:  Supervising Provider    Answer:  DOOLITTLE, ROBERT P [[id number]]

## 2012-12-24 NOTE — Telephone Encounter (Signed)
Called pt, mailbox full. Faxed Rx to pharmacy

## 2013-01-01 ENCOUNTER — Telehealth: Payer: Self-pay | Admitting: Radiology

## 2013-01-01 NOTE — Telephone Encounter (Signed)
Patients wife advised of PSA results.

## 2013-02-17 ENCOUNTER — Other Ambulatory Visit: Payer: Self-pay | Admitting: Emergency Medicine

## 2013-02-17 DIAGNOSIS — G4700 Insomnia, unspecified: Secondary | ICD-10-CM

## 2013-02-17 MED ORDER — CLONAZEPAM 1 MG PO TABS: 1.0000 mg | ORAL_TABLET | Freq: Every evening | ORAL | Status: DC | PRN

## 2013-02-17 MED ORDER — TRAZODONE HCL 50 MG PO TABS: 50.0000 mg | ORAL_TABLET | Freq: Every day | ORAL | Status: DC

## 2013-07-14 ENCOUNTER — Other Ambulatory Visit: Payer: Self-pay | Admitting: Emergency Medicine

## 2013-07-14 NOTE — Telephone Encounter (Signed)
Pt hasn't been seen since 12/2012. Does he need to RTC?

## 2013-07-15 NOTE — Telephone Encounter (Signed)
Called in.

## 2013-12-31 ENCOUNTER — Other Ambulatory Visit: Payer: Self-pay | Admitting: Emergency Medicine

## 2013-12-31 NOTE — Telephone Encounter (Signed)
Pt is overdue for f/up. Gave him 1 mos RF of trazodone w/note to RTC for more. Pended clonazepam for review.

## 2014-01-01 NOTE — Telephone Encounter (Signed)
Called in clonazepam and asked pharm to put note needs OV on Rx and let pt know if possible when he p/up.

## 2014-01-07 ENCOUNTER — Other Ambulatory Visit: Payer: Self-pay | Admitting: Emergency Medicine

## 2014-01-08 ENCOUNTER — Other Ambulatory Visit: Payer: Self-pay | Admitting: Radiology

## 2014-01-08 NOTE — Telephone Encounter (Signed)
Called in Klonopin

## 2014-01-25 ENCOUNTER — Telehealth: Payer: Self-pay | Admitting: *Deleted

## 2014-01-25 ENCOUNTER — Telehealth: Payer: Self-pay

## 2014-01-25 ENCOUNTER — Other Ambulatory Visit: Payer: Self-pay | Admitting: Emergency Medicine

## 2014-01-25 NOTE — Telephone Encounter (Signed)
Faxed Clonazepam to [facility] on [address]

## 2014-01-25 NOTE — Telephone Encounter (Signed)
Medication was called into [facility], should have been sent to [place].

## 2014-01-25 NOTE — Telephone Encounter (Signed)
SPOKE TO MR Eves ABOUT MAKING A FOLLOW UP APPT. FOR FURTHER MEDICATION REFILL

## 2014-02-21 ENCOUNTER — Other Ambulatory Visit: Payer: Self-pay | Admitting: Emergency Medicine

## 2014-02-24 ENCOUNTER — Other Ambulatory Visit: Payer: Self-pay

## 2014-02-24 MED ORDER — TRAZODONE HCL 50 MG PO TABS: ORAL_TABLET | ORAL | Status: DC

## 2014-02-24 NOTE — Telephone Encounter (Signed)
Dr Everlene Farrier, pt was given a limited supply of trazadone to hold him until appt 03/02/14. He hasn't been seen since 12/2012. Fax from pharm advises that pt's ins only covers 90 day supplies of trazadone. Do you want to OK a 90 day supply?

## 2014-03-02 ENCOUNTER — Ambulatory Visit (INDEPENDENT_AMBULATORY_CARE_PROVIDER_SITE_OTHER): Payer: 59 | Admitting: Emergency Medicine

## 2014-03-02 ENCOUNTER — Encounter: Payer: Self-pay | Admitting: Emergency Medicine

## 2014-03-02 VITALS — BP 118/70 | HR 84 | Temp 98.4°F | Resp 16 | Ht 73.5 in | Wt 241.6 lb

## 2014-03-02 DIAGNOSIS — Z23 Encounter for immunization: Secondary | ICD-10-CM

## 2014-03-02 DIAGNOSIS — R972 Elevated prostate specific antigen [PSA]: Secondary | ICD-10-CM

## 2014-03-02 DIAGNOSIS — G4700 Insomnia, unspecified: Secondary | ICD-10-CM

## 2014-03-02 LAB — COMPLETE METABOLIC PANEL WITH GFR
ALBUMIN: 4.4 g/dL (ref 3.5–5.2)
ALK PHOS: 80 U/L (ref 39–117)
ALT: 32 U/L (ref 0–53)
AST: 22 U/L (ref 0–37)
BUN: 16 mg/dL (ref 6–23)
CO2: 22 mEq/L (ref 19–32)
Calcium: 9.3 mg/dL (ref 8.4–10.5)
Chloride: 103 mEq/L (ref 96–112)
Creat: 0.95 mg/dL (ref 0.50–1.35)
GFR, Est African American: >89 mL/min
GFR, Est Non African American: 87 mL/min
Glucose, Bld: 91 mg/dL (ref 70–99)
POTASSIUM: 4.4 meq/L (ref 3.5–5.3)
SODIUM: 138 meq/L (ref 135–145)
TOTAL PROTEIN: 7.3 g/dL (ref 6.0–8.3)
Total Bilirubin: 0.4 mg/dL (ref 0.2–1.2)

## 2014-03-02 MED ORDER — TRAZODONE HCL 100 MG PO TABS: 100.0000 mg | ORAL_TABLET | Freq: Every day | ORAL | Status: DC

## 2014-03-02 MED ORDER — CLONAZEPAM 1 MG PO TABS: ORAL_TABLET | ORAL | Status: DC

## 2014-03-02 NOTE — Progress Notes (Signed)
Dear Dr. Everlene Farrier,  I will be happy to see your patient, Mr. Arcia. Sincerely    Traeson Dusza, MD

## 2014-03-02 NOTE — Patient Instructions (Signed)
Insomnia Insomnia is frequent trouble falling and/or staying asleep. Insomnia can be a long term problem or a short term problem. Both are common. Insomnia can be a short term problem when the wakefulness is related to a certain stress or worry. Long term insomnia is often related to ongoing stress during waking hours and/or poor sleeping habits. Overtime, sleep deprivation itself can make the problem worse. Every little thing feels more severe because you are overtired and your ability to cope is decreased. CAUSES   Stress, anxiety, and depression.  Poor sleeping habits.  Distractions such as TV in the bedroom.  Naps close to bedtime.  Engaging in emotionally charged conversations before bed.  Technical reading before sleep.  Alcohol and other sedatives. They may make the problem worse. They can hurt normal sleep patterns and normal dream activity.  Stimulants such as caffeine for several hours prior to bedtime.  Pain syndromes and shortness of breath can cause insomnia.  Exercise late at night.  Changing time zones may cause sleeping problems (jet lag). It is sometimes helpful to have someone observe your sleeping patterns. They should look for periods of not breathing during the night (sleep apnea). They should also look to see how long those periods last. If you live alone or observers are uncertain, you can also be observed at a sleep clinic where your sleep patterns will be professionally monitored. Sleep apnea requires a checkup and treatment. Give your caregivers your medical history. Give your caregivers observations your family has made about your sleep.  SYMPTOMS   Not feeling rested in the morning.  Anxiety and restlessness at bedtime.  Difficulty falling and staying asleep. TREATMENT   Your caregiver may prescribe treatment for an underlying medical disorders. Your caregiver can give advice or help if you are using alcohol or other drugs for self-medication. Treatment  of underlying problems will usually eliminate insomnia problems.  Medications can be prescribed for short time use. They are generally not recommended for lengthy use.  Over-the-counter sleep medicines are not recommended for lengthy use. They can be habit forming.  You can promote easier sleeping by making lifestyle changes such as:  Using relaxation techniques that help with breathing and reduce muscle tension.  Exercising earlier in the day.  Changing your diet and the time of your last meal. No night time snacks.  Establish a regular time to go to bed.  Counseling can help with stressful problems and worry.  Soothing music and white noise may be helpful if there are background noises you cannot remove.  Stop tedious detailed work at least one hour before bedtime. HOME CARE INSTRUCTIONS   Keep a diary. Inform your caregiver about your progress. This includes any medication side effects. See your caregiver regularly. Take note of:  Times when you are asleep.  Times when you are awake during the night.  The quality of your sleep.  How you feel the next day. This information will help your caregiver care for you.  Get out of bed if you are still awake after 15 minutes. Read or do some quiet activity. Keep the lights down. Wait until you feel sleepy and go back to bed.  Keep regular sleeping and waking hours. Avoid naps.  Exercise regularly.  Avoid distractions at bedtime. Distractions include watching television or engaging in any intense or detailed activity like attempting to balance the household checkbook.  Develop a bedtime ritual. Keep a familiar routine of bathing, brushing your teeth, climbing into bed at the same   time each night, listening to soothing music. Routines increase the success of falling to sleep faster.  Use relaxation techniques. This can be using breathing and muscle tension release routines. It can also include visualizing peaceful scenes. You can  also help control troubling or intruding thoughts by keeping your mind occupied with boring or repetitive thoughts like the old concept of counting sheep. You can make it more creative like imagining planting one beautiful flower after another in your backyard garden.  During your day, work to eliminate stress. When this is not possible use some of the previous suggestions to help reduce the anxiety that accompanies stressful situations. MAKE SURE YOU:   Understand these instructions.  Will watch your condition.  Will get help right away if you are not doing well or get worse. Document Released: 04/20/2000 Document Revised: 07/16/2011 Document Reviewed: 05/21/2007 Exit[employer] Patient Information 2015 [employer]. This information is not intended to replace advice given to you by your health care provider. Make sure you discuss any questions you have with your health care provider.  

## 2014-03-02 NOTE — Progress Notes (Addendum)
Subjective:  This chart was scribed for Oscar Queen, MD by Oscar [name], Medical Scribe. This patient was seen in Room 21 and the patient's care was started at 1:13 PM.   Patient ID: Oscar [name], male    DOB: Feb 05, 1956, 58 y.o.   MRN: [id number]  HPI HPI Comments: Oscar [name] is a 58 y.o. male who presents to the [facility] for a medication refill.  He was taking Ambien and Melatonin but is now taking 50mg  of Trazodone and 1mg  Klonopin.  He states that the medication is become increasingly less effective in treating his insomnia.  He is unable to sleep because he is unable to "turn his brain off" and compartmentalize.  He has never taken an SSRI and has never felt clinically depressed.  He went to counseling for 2 years in 2001 and found it to be very helpful.  He states that his counselor was trying to prescribe him antidepressants but he did not take them.  He has not been to counseling since 2002.  He states that he drinks Coca-Cola regularly and his last caffeine intake is late in the afternoon.  His wife states that he does not snore or exhibit symptoms of sleep apnea.  He states that he normally breathes with his mouth open due to his sinusitis.  He states that he has tried Flonase with no relief to his symptoms but will experience temporary relief when he uses a Vick's inhaler.  He denies chest pain and SOB as associated symptoms.  He works in Data processing manager and states that his job is very stressful but it is not his major stressor.    Patient Active Problem List   Diagnosis Date Noted  . Abnormal PSA 05/13/2012  . Insomnia   . CHEST PAIN 11/22/2008  . DYSPNEA 11/11/2008  . HYPERLIPIDEMIA 11/10/2008   Past Medical History  Diagnosis Date  . Allergy   . Chronic kidney disease     [id number]  . Insomnia   . Hyperlipidemia    Past Surgical History  Procedure Laterality Date  . Thumb surgery      right  . Foot surgery      right  . Shoulder  arthroscopy    . Vasectomy     Allergies  Allergen Reactions  . Poison Ivy Treatments [Poison Ivy Treatments]     Allergic to poison ivy  . Pollen Extract    Prior to Admission medications   Medication Sig Start Date End Date Taking? Authorizing Provider  amoxicillin-clavulanate (AUGMENTIN) 875-125 MG per tablet Take 1 tablet by mouth 2 (two) times daily. 03/10/12   Leandrew Koyanagi, MD  Ascorbic Acid (VITAMIN C) 1000 MG tablet Take 1,000 mg by mouth daily.      Historical Provider, MD  clonazePAM (KLONOPIN) 1 MG tablet TAKE 1 TABLET BY MOUTH AT BEDTIME AS NEEDED. (**DUE 3/21**) 01/25/14   Darlyne Russian, MD  fluticasone (FLONASE) 50 MCG/ACT nasal spray Place 2 sprays into the nose 2 (two) times daily. 02/26/12   Leandrew Koyanagi, MD  FLUTICASONE PROPIONATE, INHAL, IN Inhale 1 spray into the lungs as needed.      Historical Provider, MD  GLUCOSAMINE-CHONDROITIN PO Take 1 tablet by mouth daily.      Historical Provider, MD  MELATONIN PO Take by mouth. Pt taking 1-2 tablets qhs prn    Historical Provider, MD  Misc Natural Products (GINSENG COMPLEX PO) Take 1 tablet by mouth daily.  Historical Provider, MD  Multiple Vitamin (MULTIVITAMIN) tablet Take 1 tablet by mouth daily.    Historical Provider, MD  Omega-3 Fatty Acids (FISH OIL PO) Take 1 tablet by mouth as needed.     Historical Provider, MD  Saw Palmetto, Serenoa repens, (SAW PALMETTO PO) Take 1 tablet by mouth daily.      Historical Provider, MD  traZODone (DESYREL) 50 MG tablet TAKE 1 TABLET BY MOUTH AT BEDTIME. **PATIENT NEEDS OFFICE VISIT FOR ADDITIONAL REFILLS 02/24/14   Darlyne Russian, MD  VITAMIN E PO Take 1 tablet by mouth daily.      Historical Provider, MD  zolpidem (AMBIEN CR) 12.5 MG CR tablet Take 1 tablet (12.5 mg total) by mouth at bedtime as needed for sleep. 12/24/12   Fara Chute, PA-C   History   Social History  . Marital Status: Married    Spouse Name: N/A    Number of Children: N/A  . Years of  Education: N/A   Occupational History  . Not on file.   Social History Main Topics  . Smoking status: Never Smoker   . Smokeless tobacco: Not on file  . Alcohol Use: 1.8 oz/week    3 Cans of beer per week  . Drug Use: No  . Sexual Activity: Yes    Birth Control/ Protection: Other-see comments     Comment: vasectomy   Other Topics Concern  . Not on file   Social History Narrative   ** Merged History Encounter **         Review of Systems  HENT: Positive for sinus pressure.   Psychiatric/Behavioral: Positive for sleep disturbance.     Objective:  Physical Exam  Nursing note and vitals reviewed. Constitutional: He is oriented to person, place, and time. He appears well-developed and well-nourished.  HENT:  Head: Normocephalic and atraumatic.  Eyes: Conjunctivae and EOM are normal. Pupils are equal, round, and reactive to light. No scleral icterus.  Neck: Normal range of motion. Neck supple. No thyromegaly present.  Cardiovascular: Normal rate, regular rhythm and normal heart sounds.   No murmur heard. Pulmonary/Chest: Effort normal and breath sounds normal. No respiratory distress. He has no wheezes. He has no rales.  Abdominal: Soft. There is no tenderness. A hernia is present.  Small 1cm umbilical hernia.  Musculoskeletal: Normal range of motion.  Lymphadenopathy:    He has no cervical adenopathy.  Neurological: He is alert and oriented to person, place, and time.  Skin: Skin is warm and dry.  Psychiatric: He has a normal mood and affect. His behavior is normal.   Meds ordered this encounter  Medications  . clonazePAM (KLONOPIN) 1 MG tablet    Sig: Take 1-1-1/2 tablets at night as needed for sleep    Dispense:  120 tablet    Refill:  1    This request is for a new prescription for a controlled substance as required by Federal/State law.  . traZODone (DESYREL) 100 MG tablet    Sig: Take 1 tablet (100 mg total) by mouth at bedtime. TAKE 1 TABLET BY MOUTH AT  BEDTIME. **PATIENT NEEDS OFFICE VISIT FOR ADDITIONAL REFILLS    Dispense:  90 tablet    Refill:  3    BP 118/70  Pulse 84  Temp(Src) 98.4 F (36.9 C) (Oral)  Resp 16  Ht 6' 1.5" (1.867 m)  Wt 241 lb 9.6 oz (109.589 kg)  BMI 31.44 kg/m2  SpO2 96% Assessment & Plan:  Patient is agreeable to  see Dr. Brett Fairy for sleep evaluation.  Will increase Desyrel to 100mg  at night and he is given an extra 30 tablet of Klonopin to keep on hand in case he needs a smaller addition of Klonopin.    I personally performed the services described in this documentation, which was scribed in my presence. The recorded information has been reviewed and is accurate.

## 2014-03-03 LAB — CBC WITH DIFFERENTIAL/PLATELET
BASOS ABS: 0.1 10*3/uL (ref 0.0–0.1)
Basophils Relative: 1 % (ref 0–1)
Eosinophils Absolute: 0.2 10*3/uL (ref 0.0–0.7)
Eosinophils Relative: 3 % (ref 0–5)
HEMATOCRIT: 47.1 % (ref 39.0–52.0)
Hemoglobin: 17.1 g/dL — ABNORMAL HIGH (ref 13.0–17.0)
Lymphocytes Relative: 28 % (ref 12–46)
Lymphs Abs: 2.3 10*3/uL (ref 0.7–4.0)
MCH: 34.1 pg — ABNORMAL HIGH (ref 26.0–34.0)
MCHC: 36.3 g/dL — AB (ref 30.0–36.0)
MCV: 94 fL (ref 78.0–100.0)
Monocytes Absolute: 0.9 10*3/uL (ref 0.1–1.0)
Monocytes Relative: 11 % (ref 3–12)
NEUTROS ABS: 4.6 10*3/uL (ref 1.7–7.7)
NEUTROS PCT: 57 % (ref 43–77)
Platelets: 271 10*3/uL (ref 150–400)
RBC: 5.01 MIL/uL (ref 4.22–5.81)
RDW: 13.3 % (ref 11.5–15.5)
WBC: 8.1 10*3/uL (ref 4.0–10.5)

## 2014-03-03 LAB — PSA: PSA: 1.78 ng/mL (ref <=4.00)

## 2014-05-20 ENCOUNTER — Encounter: Payer: 59 | Admitting: Emergency Medicine

## 2014-10-05 ENCOUNTER — Telehealth: Payer: Self-pay

## 2014-10-05 ENCOUNTER — Other Ambulatory Visit: Payer: Self-pay | Admitting: Emergency Medicine

## 2014-10-05 DIAGNOSIS — G4700 Insomnia, unspecified: Secondary | ICD-10-CM

## 2014-10-05 MED ORDER — CLONAZEPAM 1 MG PO TABS: ORAL_TABLET | ORAL | Status: DC

## 2014-10-05 NOTE — Telephone Encounter (Signed)
Pt's wife requesting refill on Klonopin. Please advise on refill.

## 2015-01-18 ENCOUNTER — Encounter: Payer: Self-pay | Admitting: Emergency Medicine

## 2015-02-27 ENCOUNTER — Other Ambulatory Visit: Payer: Self-pay | Admitting: Emergency Medicine

## 2015-03-24 ENCOUNTER — Ambulatory Visit (INDEPENDENT_AMBULATORY_CARE_PROVIDER_SITE_OTHER): Payer: 59 | Admitting: Emergency Medicine

## 2015-03-24 ENCOUNTER — Encounter: Payer: Self-pay | Admitting: Emergency Medicine

## 2015-03-24 VITALS — BP 129/80 | HR 85 | Temp 98.1°F | Resp 20 | Ht 74.0 in | Wt 237.4 lb

## 2015-03-24 DIAGNOSIS — I781 Nevus, non-neoplastic: Secondary | ICD-10-CM | POA: Diagnosis not present

## 2015-03-24 DIAGNOSIS — F439 Reaction to severe stress, unspecified: Secondary | ICD-10-CM

## 2015-03-24 DIAGNOSIS — R972 Elevated prostate specific antigen [PSA]: Secondary | ICD-10-CM

## 2015-03-24 DIAGNOSIS — G4700 Insomnia, unspecified: Secondary | ICD-10-CM

## 2015-03-24 DIAGNOSIS — R0602 Shortness of breath: Secondary | ICD-10-CM

## 2015-03-24 DIAGNOSIS — Z23 Encounter for immunization: Secondary | ICD-10-CM

## 2015-03-24 DIAGNOSIS — Z1159 Encounter for screening for other viral diseases: Secondary | ICD-10-CM

## 2015-03-24 DIAGNOSIS — Z0000 Encounter for general adult medical examination without abnormal findings: Secondary | ICD-10-CM | POA: Diagnosis not present

## 2015-03-24 DIAGNOSIS — E785 Hyperlipidemia, unspecified: Secondary | ICD-10-CM

## 2015-03-24 DIAGNOSIS — Z658 Other specified problems related to psychosocial circumstances: Secondary | ICD-10-CM | POA: Diagnosis not present

## 2015-03-24 DIAGNOSIS — Z114 Encounter for screening for human immunodeficiency virus [HIV]: Secondary | ICD-10-CM | POA: Diagnosis not present

## 2015-03-24 DIAGNOSIS — H01006 Unspecified blepharitis left eye, unspecified eyelid: Secondary | ICD-10-CM | POA: Diagnosis not present

## 2015-03-24 DIAGNOSIS — R9431 Abnormal electrocardiogram [ECG] [EKG]: Secondary | ICD-10-CM

## 2015-03-24 LAB — LIPID PANEL
Cholesterol: 215 mg/dL — ABNORMAL HIGH (ref 125–200)
HDL: 55 mg/dL (ref >=40)
LDL CALC: 136 mg/dL — AB (ref <130)
Total CHOL/HDL Ratio: 3.9 Ratio (ref <=5.0)
Triglycerides: 122 mg/dL (ref <150)
VLDL: 24 mg/dL (ref <30)

## 2015-03-24 LAB — POCT URINALYSIS DIP (MANUAL ENTRY)
Bilirubin, UA: NEGATIVE
Glucose, UA: NEGATIVE
Ketones, POC UA: NEGATIVE
LEUKOCYTES UA: NEGATIVE
NITRITE UA: NEGATIVE
PH UA: 5.5
PROTEIN UA: NEGATIVE
RBC UA: NEGATIVE
Spec Grav, UA: 1.025
UROBILINOGEN UA: 0.2

## 2015-03-24 LAB — COMPLETE METABOLIC PANEL WITH GFR
ALK PHOS: 69 U/L (ref 40–115)
ALT: 36 U/L (ref 9–46)
AST: 21 U/L (ref 10–35)
Albumin: 4.3 g/dL (ref 3.6–5.1)
BUN: 16 mg/dL (ref 7–25)
CALCIUM: 9.4 mg/dL (ref 8.6–10.3)
CHLORIDE: 105 mmol/L (ref 98–110)
CO2: 23 mmol/L (ref 20–31)
CREATININE: 0.8 mg/dL (ref 0.70–1.25)
GFR, Est Non African American: >89 mL/min (ref >=60)
Glucose, Bld: 117 mg/dL — ABNORMAL HIGH (ref 65–99)
Potassium: 4.2 mmol/L (ref 3.5–5.3)
Sodium: 139 mmol/L (ref 135–146)
Total Bilirubin: 0.7 mg/dL (ref 0.2–1.2)
Total Protein: 7.1 g/dL (ref 6.1–8.1)

## 2015-03-24 LAB — CBC WITH DIFFERENTIAL/PLATELET
Basophils Absolute: 0.1 10*3/uL (ref 0.0–0.1)
Basophils Relative: 1 % (ref 0–1)
Eosinophils Absolute: 0.1 10*3/uL (ref 0.0–0.7)
Eosinophils Relative: 2 % (ref 0–5)
HEMATOCRIT: 47.9 % (ref 39.0–52.0)
Hemoglobin: 16.8 g/dL (ref 13.0–17.0)
Lymphocytes Relative: 28 % (ref 12–46)
Lymphs Abs: 1.7 10*3/uL (ref 0.7–4.0)
MCH: 34.2 pg — AB (ref 26.0–34.0)
MCHC: 35.1 g/dL (ref 30.0–36.0)
MCV: 97.6 fL (ref 78.0–100.0)
MPV: 11.6 fL (ref 8.6–12.4)
Monocytes Absolute: 0.7 10*3/uL (ref 0.1–1.0)
Monocytes Relative: 11 % (ref 3–12)
NEUTROS ABS: 3.6 10*3/uL (ref 1.7–7.7)
NEUTROS PCT: 58 % (ref 43–77)
PLATELETS: 249 10*3/uL (ref 150–400)
RBC: 4.91 MIL/uL (ref 4.22–5.81)
RDW: 13 % (ref 11.5–15.5)
WBC: 6.2 10*3/uL (ref 4.0–10.5)

## 2015-03-24 LAB — POC MICROSCOPIC URINALYSIS (UMFC): Mucus: ABSENT

## 2015-03-24 LAB — HEPATITIS C ANTIBODY: HCV Ab: NEGATIVE

## 2015-03-24 LAB — HIV ANTIBODY (ROUTINE TESTING W REFLEX): HIV: NONREACTIVE

## 2015-03-24 LAB — TSH: TSH: 1.63 u[IU]/mL (ref 0.350–4.500)

## 2015-03-24 MED ORDER — ERYTHROMYCIN 5 MG/GM OP OINT: 1.0000 "application " | TOPICAL_OINTMENT | Freq: Three times a day (TID) | OPHTHALMIC | Status: DC

## 2015-03-24 MED ORDER — CLONAZEPAM 1 MG PO TABS: ORAL_TABLET | ORAL | Status: DC

## 2015-03-24 MED ORDER — ZOSTER VACCINE LIVE 19400 UNT/0.65ML ~~LOC~~ SOLR: 0.6500 mL | Freq: Once | SUBCUTANEOUS | Status: DC

## 2015-03-24 MED ORDER — TRAZODONE HCL 100 MG PO TABS: ORAL_TABLET | ORAL | Status: DC

## 2015-03-24 NOTE — Progress Notes (Addendum)
Patient ID: Oscar [name], male   DOB: Jun 29, 1955, 59 y.o.   MRN: [id number]     This chart was scribed for Arlyss Queen, MD by Zola Button, Medical Scribe. This patient was seen in room 22 and the patient's care was started at 8:35 AM.   Chief Complaint:  Chief Complaint  Patient presents with  . Annual Exam    HPI: Oscar [name] is a 59 y.o. male who reports to [facility] today for an annual physical exam. Patient has been doing well overall.  Insomnia: Patient notes that his trazodone is not as effective anymore as stress from work has started to affect his sleep again. He currently takes 0.5 tablet of trazodone and 1 tablet of Klonopin for sleep. He likes to hunt occasionally and exercise to relieve stress. He has never seen a sleep specialist due to cost.  Left eye pain: Patient has been having left eye pain with redness watery discharge for the past few days, which he believes may be due to poison ivy in his eyelid or corneal abrasion. The pain is worse when taking the contacts out. He does wear contacts and did abstain from wearing them for 5 days, but he has continued to wear contacts again.  Dyspnea: Patient had been evaluated in the past by Dr. Lamonte Sakai for chest pain and SOB. He denies chest pain and SOB currently.  Vitamins/supplements: He currently takes saw palmetto, vitamin C, and occasional OTC testosterone.  Family history: includes ovarian cancer (mother), heart disease (father, grandfather), giant cell lymphoma (father), and melanoma (brother). He has never been to the dermatologist. Patient denies history of smoking.  He has been divorced since 2000.  Past Medical History  Diagnosis Date  . Allergy   . Chronic kidney disease     [id number]  . Insomnia   . Hyperlipidemia   . Cataract    Past Surgical History  Procedure Laterality Date  . Thumb surgery      right  . Foot surgery      right  . Shoulder arthroscopy    . Vasectomy     Social History   Social  History  . Marital Status: Married    Spouse Name: N/A  . Number of Children: N/A  . Years of Education: N/A   Social History Main Topics  . Smoking status: Never Smoker   . Smokeless tobacco: None  . Alcohol Use: 1.8 oz/week    3 Cans of beer per week  . Drug Use: No  . Sexual Activity: Yes    Birth Control/ Protection: Other-see comments     Comment: vasectomy   Other Topics Concern  . None   Social History Narrative   ** Merged History Encounter **       Family History  Problem Relation Age of Onset  . Colon cancer Neg Hx   . Esophageal cancer Neg Hx   . Stomach cancer Neg Hx   . Cancer Mother   . Cancer Brother   . Cancer Father   . Heart disease Father    Allergies  Allergen Reactions  . Poison Ivy Treatments [Poison Ivy Treatments]     Allergic to poison ivy  . Pollen Extract    Prior to Admission medications   Medication Sig Start Date End Date Taking? Authorizing Provider  Ascorbic Acid (VITAMIN C) 1000 MG tablet Take 1,000 mg by mouth daily.     Yes Historical Provider, MD  clonazePAM (KLONOPIN) 1 MG tablet Take 1-1-1/2  tablets at night as needed for sleep 10/05/14  Yes Darlyne Russian, MD  fluticasone (FLONASE) 50 MCG/ACT nasal spray Place 2 sprays into the nose 2 (two) times daily. 02/26/12  Yes Leandrew Koyanagi, MD  FLUTICASONE PROPIONATE, INHAL, IN Inhale 1 spray into the lungs as needed.     Yes Historical Provider, MD  GLUCOSAMINE-CHONDROITIN PO Take 1 tablet by mouth daily.     Yes Historical Provider, MD  MELATONIN PO Take by mouth. Pt taking 1-2 tablets qhs prn   Yes Historical Provider, MD  Misc Natural Products (GINSENG COMPLEX PO) Take 1 tablet by mouth daily.     Yes Historical Provider, MD  Multiple Vitamin (MULTIVITAMIN) tablet Take 1 tablet by mouth daily.   Yes Historical Provider, MD  Omega-3 Fatty Acids (FISH OIL PO) Take 1 tablet by mouth as needed.    Yes Historical Provider, MD  Saw Palmetto, Serenoa repens, (SAW PALMETTO PO) Take 1  tablet by mouth daily.     Yes Historical Provider, MD  traZODone (DESYREL) 100 MG tablet TAKE 1 TABLET AT BEDTIME (DUE 05/24/2014) 02/27/15  Yes Darlyne Russian, MD  VITAMIN E PO Take 1 tablet by mouth daily.     Yes Historical Provider, MD     ROS: The patient denies fevers, chills, night sweats, unintentional weight loss, chest pain, palpitations, wheezing, dyspnea on exertion, nausea, vomiting, abdominal pain, dysuria, hematuria, melena, numbness, weakness, or tingling.   All other systems have been reviewed and were otherwise negative with the exception of those mentioned in the HPI and as above.    PHYSICAL EXAM: Filed Vitals:   03/24/15 0816  BP: 129/80  Pulse: 85  Temp: 98.1 F (36.7 C)  Resp: 20   Body mass index is 30.46 kg/(m^2).   General: Alert, no acute distress HEENT:  Normocephalic, atraumatic, oropharynx patent. Eye: Juliette Mangle [facility] Cardiovascular:  Regular rate and rhythm, no rubs murmurs or gallops.  No Carotid bruits, radial pulse intact. No pedal edema.  Respiratory: Clear to auscultation bilaterally.  No wheezes, rales, or rhonchi.  No cyanosis, no use of accessory musculature Abdominal: No organomegaly, abdomen is soft and non-tender, positive bowel sounds.  No masses. Musculoskeletal: Gait intact. No edema, tenderness Skin: No rashes. Neurologic: Facial musculature symmetric. Psychiatric: Patient acts appropriately throughout our interaction. Lymphatic: No cervical or submandibular lymphadenopathy    LABS:    EKG/XRAY:   Primary read interpreted by Dr. [facility]lene [facility] at [facility].   ASSESSMENT/PLAN: 1. Annual physical exam Physical examination is normal - POC Hemoccult Bld/Stl (3-Cd Home Screen); Future - CBC with Differential/Platelet - COMPLETE METABOLIC PANEL WITH GFR - POCT urinalysis dipstick - POCT Microscopic Urinalysis ([facility])  2. Rising PSA level  - PSA  3. Flu vaccine need Flu shot was given.  4. Insomnia This is tied in with the stress  related to his ex-wife. 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 We'll increase his Desyrel to 75 mg at bedtime continue Klonopin. He declined a referral to sleep specialist at this time. - TSH - traZODone (DESYREL) 100 MG tablet; TAKE 1 TABLET AT BEDTIME (DUE 05/24/2014)  Dispense: 90 tablet; Refill: 3 - clonazePAM (KLONOPIN) 1 MG tablet; Take 1-1-1/2 tablets at night as needed for sleep  Dispense: 120 tablet; Refill: 1  5. SOB (shortness of breath) His EKG is unchanged. His shortness of breath is not an issue. He is very active physically by gear hunting and walking. - EKG 12-Lead  6. Stress This continues to be  an issue primarily related to his divorce.  7. Need for hepatitis C screening test  - Hepatitis C antibody  8. Screening for HIV (human immunodeficiency virus)  - HIV antibody  9. Hyperlipidemia  - Lipid panel  10. Need for immunization against influenza  - Flu Vaccine QUAD 36+ mos IM (Fluarix)  11. Blepharitis of left eye  - erythromycin ophthalmic ointment; Place 1 application into the left eye 3 (three) times daily.  Dispense: 3.5 g; Refill: 0  12. Nevus, non-neoplastic Patient referred to dermatology for a good skin check. He does have a family history of melanoma. - Ambulatory referral to Dermatology   I personally performed the services described in this documentation, which was scribed in my presence. The recorded information has been reviewed and is accurate.I personally performed the services described in this documentation, which was scribed in my presence. The recorded information has been reviewed and is accurate.  Arlyss Queen, MD  [facility]  03/24/2015 9:34 AM  By signing my name below, I, Zola Button, attest that this documentation has been prepared under the direction and in the presence of Arlyss Queen, MD.  Electronically Signed: Zola Button, Medical Scribe. 03/24/2015. 8:35 AM.    Oscar [name] sideeffects, risk and benefits, and alternatives of medications d/w patient. Patient is aware that all medications have potential sideeffects and we are unable to predict every sideeffect or drug-drug interaction that may occur.  Arlyss Queen MD 03/24/2015 8:35 AM

## 2015-03-24 NOTE — Addendum Note (Signed)
Addended by: Arlyss Queen A on: 03/24/2015 02:39 PM   Modules accepted: Orders

## 2015-03-25 LAB — PSA: PSA: 2.31 ng/mL (ref <=4.00)

## 2015-10-16 ENCOUNTER — Other Ambulatory Visit: Payer: Self-pay | Admitting: Emergency Medicine

## 2015-10-18 NOTE — Telephone Encounter (Signed)
Faxed

## 2016-03-24 ENCOUNTER — Other Ambulatory Visit: Payer: Self-pay | Admitting: Emergency Medicine

## 2016-03-26 NOTE — Telephone Encounter (Signed)
Daub pt. Last OV 03/24/15. Last RFs 6/13 for #120 + 1 RF.

## 2016-03-26 NOTE — Telephone Encounter (Signed)
Refill denied for clonazepam.  His last office visit at [facility] was 03/2015.  Please advise patient that Dr. Everlene Farrier has retired; he needs to establish with a new provider. Please offer pt an appointment with one of our new providers.

## 2016-03-27 NOTE — Telephone Encounter (Signed)
LM w/Dr Smith's message. Asked for CB to sch appt or with any ?s.

## 2016-03-28 ENCOUNTER — Other Ambulatory Visit: Payer: Self-pay | Admitting: Emergency Medicine

## 2016-04-06 ENCOUNTER — Ambulatory Visit (INDEPENDENT_AMBULATORY_CARE_PROVIDER_SITE_OTHER): Payer: 59 | Admitting: Emergency Medicine

## 2016-04-06 VITALS — BP 122/70 | HR 74 | Temp 98.0°F | Resp 18 | Ht 74.0 in | Wt 239.0 lb

## 2016-04-06 DIAGNOSIS — R0602 Shortness of breath: Secondary | ICD-10-CM | POA: Diagnosis not present

## 2016-04-06 DIAGNOSIS — F439 Reaction to severe stress, unspecified: Secondary | ICD-10-CM

## 2016-04-06 DIAGNOSIS — Z23 Encounter for immunization: Secondary | ICD-10-CM | POA: Diagnosis not present

## 2016-04-06 DIAGNOSIS — F5101 Primary insomnia: Secondary | ICD-10-CM | POA: Diagnosis not present

## 2016-04-06 DIAGNOSIS — E782 Mixed hyperlipidemia: Secondary | ICD-10-CM | POA: Diagnosis not present

## 2016-04-06 DIAGNOSIS — R739 Hyperglycemia, unspecified: Secondary | ICD-10-CM

## 2016-04-06 DIAGNOSIS — R011 Cardiac murmur, unspecified: Secondary | ICD-10-CM

## 2016-04-06 DIAGNOSIS — R972 Elevated prostate specific antigen [PSA]: Secondary | ICD-10-CM

## 2016-04-06 LAB — POCT URINALYSIS DIP (MANUAL ENTRY)
BILIRUBIN UA: NEGATIVE
Bilirubin, UA: NEGATIVE
GLUCOSE UA: NEGATIVE
Leukocytes, UA: NEGATIVE
Nitrite, UA: NEGATIVE
Protein Ur, POC: NEGATIVE
RBC UA: NEGATIVE
SPEC GRAV UA: 1.015
UROBILINOGEN UA: 1
pH, UA: 6

## 2016-04-06 LAB — LIPID PANEL
CHOL/HDL RATIO: 4 ratio (ref <5.0)
CHOLESTEROL: 174 mg/dL (ref <200)
HDL: 44 mg/dL (ref >40)
LDL Cholesterol: 113 mg/dL — ABNORMAL HIGH (ref <100)
TRIGLYCERIDES: 85 mg/dL (ref <150)
VLDL: 17 mg/dL (ref <30)

## 2016-04-06 LAB — POCT CBC
GRANULOCYTE PERCENT: 57.5 % (ref 37–80)
HEMATOCRIT: 45.5 % (ref 43.5–53.7)
Hemoglobin: 17.1 g/dL (ref 14.1–18.1)
Lymph, poc: 2.4 (ref 0.6–3.4)
MCH: 35.4 pg — AB (ref 27–31.2)
MCHC: 37.5 g/dL — AB (ref 31.8–35.4)
MCV: 94.6 fL (ref 80–97)
MID (CBC): 0.7 (ref 0–0.9)
MPV: 7.7 fL (ref 0–99.8)
PLATELET COUNT, POC: 230 10*3/uL (ref 142–424)
POC GRANULOCYTE: 4.2 (ref 2–6.9)
POC LYMPH %: 33.2 % (ref 10–50)
POC MID %: 9.3 %M (ref 0–12)
RBC: 4.81 M/uL (ref 4.69–6.13)
RDW, POC: 12.5 %
WBC: 7.3 10*3/uL (ref 4.6–10.2)

## 2016-04-06 LAB — COMPLETE METABOLIC PANEL WITH GFR
ALBUMIN: 4.3 g/dL (ref 3.6–5.1)
ALK PHOS: 75 U/L (ref 40–115)
ALT: 39 U/L (ref 9–46)
AST: 23 U/L (ref 10–35)
BILIRUBIN TOTAL: 0.5 mg/dL (ref 0.2–1.2)
BUN: 17 mg/dL (ref 7–25)
CO2: 26 mmol/L (ref 20–31)
Calcium: 9.2 mg/dL (ref 8.6–10.3)
Chloride: 105 mmol/L (ref 98–110)
Creat: 0.95 mg/dL (ref 0.70–1.25)
GFR, EST NON AFRICAN AMERICAN: 86 mL/min (ref >=60)
Glucose, Bld: 106 mg/dL — ABNORMAL HIGH (ref 65–99)
POTASSIUM: 4.5 mmol/L (ref 3.5–5.3)
Sodium: 141 mmol/L (ref 135–146)
TOTAL PROTEIN: 6.8 g/dL (ref 6.1–8.1)

## 2016-04-06 LAB — POCT GLYCOSYLATED HEMOGLOBIN (HGB A1C): Hemoglobin A1C: 5.6

## 2016-04-06 LAB — GLUCOSE, POCT (MANUAL RESULT ENTRY): POC Glucose: 102 mg/dl — AB (ref 70–99)

## 2016-04-06 MED ORDER — CLONAZEPAM 1 MG PO TABS: ORAL_TABLET | ORAL | 1 refills | Status: DC

## 2016-04-06 MED ORDER — TRAZODONE HCL 100 MG PO TABS: ORAL_TABLET | ORAL | 3 refills | Status: DC

## 2016-04-06 MED ORDER — ZOSTER VACCINE LIVE 19400 UNT/0.65ML ~~LOC~~ SUSR: 0.6500 mL | Freq: Once | SUBCUTANEOUS | 0 refills | Status: AC

## 2016-04-06 NOTE — Progress Notes (Signed)
Patient ID: Oscar [name], male   DOB: 1956-03-15, 60 y.o.   MRN: [id number]    By signing my name below, I, Essence Howell, attest that this documentation has been prepared under the direction and in the presence of Darlyne Russian, MD Electronically Signed: Ladene Artist, ED Scribe 04/06/2016 at 8:12 AM.  Chief Complaint:  Chief Complaint  Patient presents with  . Medication Refill    TRAZODONE,KLONOPIN/GLUCOSE LABS  . Flu Vaccine    HPI: Oscar [name] is a 60 y.o. male who reports to [facility] today for a medication refill of Trazodone and Klonopin. Pt states that he cut Trazodone and increased Klonopin since he was last seen. Pt states that his biggest issue has been trying to "shut things off".  Cardiology  Pt denies chest pain, sob. Pt was referred to cardiology 1 year ago but did not follow up. He was last seen by cardiologist Bing Quarry, MD in 2010.   Dermatology  Pt states that he saw the dermatologist once. He denies any concerning skin lesions at this time.   GI He recently got over a stomach virus but denies any GI symptoms at this time.   Stress  Pt reports that he is still battling in court with his ex-wife whom he has been divorced from for ~12 years. She is having a hard time moving on and finding happiness of her own.   Past Medical History:  Diagnosis Date  . Allergy   . Cataract   . Chronic kidney disease    [id number]  . Hyperlipidemia   . Insomnia    Past Surgical History:  Procedure Laterality Date  . FOOT SURGERY     right  . SHOULDER ARTHROSCOPY    . thumb surgery     right  . VASECTOMY     Social History   Social History  . Marital status: Married    Spouse name: N/A  . Number of children: N/A  . Years of education: N/A   Social History Main Topics  . Smoking status: Never Smoker  . Smokeless tobacco: Never Used  . Alcohol use 1.8 oz/week    3 Cans of beer per week  . Drug use: No  . Sexual activity: Yes    Birth control/  protection: Other-see comments     Comment: vasectomy   Other Topics Concern  . None   Social History Narrative   ** Merged History Encounter **       Family History  Problem Relation Age of Onset  . Cancer Mother   . Cancer Brother   . Cancer Father   . Heart disease Father   . Colon cancer Neg Hx   . Esophageal cancer Neg Hx   . Stomach cancer Neg Hx    Allergies  Allergen Reactions  . Poison Ivy Treatments [Poison Ivy Treatments]     Allergic to poison ivy  . Pollen Extract    Prior to Admission medications   Medication Sig Start Date End Date Taking? Authorizing Provider  Ascorbic Acid (VITAMIN C) 1000 MG tablet Take 1,000 mg by mouth daily.     Yes Historical Provider, MD  clonazePAM (KLONOPIN) 1 MG tablet TAKE 1 TO 1 1/2 TABLETS BY MOUTH AT NIGHT AS NEEDED FOR SLEEP. 10/18/15  Yes Darlyne Russian, MD  erythromycin ophthalmic ointment Place 1 application into the left eye 3 (three) times daily. 03/24/15  Yes Darlyne Russian, MD  fluticasone (FLONASE) 50 MCG/ACT nasal spray Place  2 sprays into the nose 2 (two) times daily. 02/26/12  Yes Leandrew Koyanagi, MD  FLUTICASONE PROPIONATE, INHAL, IN Inhale 1 spray into the lungs as needed.     Yes Historical Provider, MD  GLUCOSAMINE-CHONDROITIN PO Take 1 tablet by mouth daily.     Yes Historical Provider, MD  Misc Natural Products (GINSENG COMPLEX PO) Take 1 tablet by mouth daily.     Yes Historical Provider, MD  Multiple Vitamin (MULTIVITAMIN) tablet Take 1 tablet by mouth daily.   Yes Historical Provider, MD  Omega-3 Fatty Acids (FISH OIL PO) Take 1 tablet by mouth as needed.    Yes Historical Provider, MD  Saw Palmetto, Serenoa repens, (SAW PALMETTO PO) Take 1 tablet by mouth daily.     Yes Historical Provider, MD  traZODone (DESYREL) 100 MG tablet TAKE 1 TABLET AT BEDTIME (DUE 05/24/2014) 03/24/15  Yes Darlyne Russian, MD  traZODone (DESYREL) 100 MG tablet TAKE 1 TABLET AT BEDTIME (DUE 05/24/2014) 03/30/16  Yes Wardell Honour, MD    VITAMIN E PO Take 1 tablet by mouth daily.     Yes Historical Provider, MD  zoster vaccine live, PF, (ZOSTAVAX) 13086 UNT/0.65ML injection Inject 19,400 Units into the skin once. 03/24/15  Yes Darlyne Russian, MD  zoster vaccine live, PF, (ZOSTAVAX) 57846 UNT/0.65ML injection Inject 19,400 Units into the skin once. 03/24/15  Yes Darlyne Russian, MD   ROS: The patient denies fevers, chills, night sweats, unintentional weight loss, chest pain, palpitations, wheezing, dyspnea on exertion, nausea, vomiting, abdominal pain, dysuria, hematuria, melena, numbness, weakness, or tingling.   All other systems have been reviewed and were otherwise negative with the exception of those mentioned in the HPI and as above.    PHYSICAL EXAM: Vitals:   04/06/16 0800  BP: 122/70  Pulse: 74  Resp: 18  Temp: 98 F (36.7 C)   Body mass index is 30.69 kg/m.   General: Alert, no acute distress HEENT:  Normocephalic, atraumatic, oropharynx patent. Eye: Juliette Mangle [facility] Cardiovascular:  Regular rate and rhythm, no rubs or gallops. Faint 1/6 systolic murmur at the L sternal border.  No Carotid bruits, radial pulse intact. No pedal edema.  Respiratory: Clear to auscultation bilaterally.  No wheezes, rales, or rhonchi.  No cyanosis, no use of accessory musculature Abdominal: No organomegaly, abdomen is soft and non-tender, positive bowel sounds.  No masses. Musculoskeletal: Gait intact. No edema, tenderness GU: Normal prostate exam Skin: No rashes. Neurologic: Facial musculature symmetric. Psychiatric: Patient acts appropriately throughout our interaction. Lymphatic: No cervical or submandibular lymphadenopathy  LABS:  EKG/XRAY:   Primary read interpreted by Dr. Everlene Farrier at [facility].  ASSESSMENT/PLAN:  Patient looks good. His main problem is stress related over his divorce and legal proceedings related to this. His EKG is essentially unchanged. He has T-wave changes inferiorly with mild prolongation of the QRS. I  placed another referral to the cardiologist. This was placed last year. His Klonopin and trazodone were refilled. He was given a prescription for Zostavax. Will recheck when necessary.I personally performed the services described in this documentation, which was scribed in my presence. The recorded information has been reviewed and is accurate.  Gross sideeffects, risk and benefits, and alternatives of medications d/w patient. Patient is aware that all medications have potential sideeffects and we are unable to predict every sideeffect or drug-drug interaction that may occur.  Arlyss Queen MD 04/06/2016 8:11 AM

## 2016-04-06 NOTE — Patient Instructions (Addendum)
IF you received an x-ray today, you will receive an invoice from [facility]. Please contact [facility] at [phone] with questions or concerns regarding your invoice.   IF you received labwork today, you will receive an invoice from [employer]. Please contact [facility] at [phone] with questions or concerns regarding your invoice.   Our billing staff will not be able to assist you with questions regarding bills from these companies.  You will be contacted with the lab results as soon as they are available. The fastest way to get your results is to activate your My Chart account. Instructions are located on the last page of this paperwork. If you have not heard from [place] regarding the results in 2 weeks, please contact this office.    Health mainHealth Maintenance, Male A healthy lifestyle and preventative care can promote health and wellness.  Maintain regular health, dental, and eye exams.  Eat a healthy diet. Foods like vegetables, fruits, whole grains, low-fat dairy products, and lean protein foods contain the nutrients you need and are low in calories. Decrease your intake of foods high in solid fats, added sugars, and salt. Get information about a proper diet from your health care provider, if necessary.  Regular physical exercise is one of the most important things you can do for your health. Most adults should get at least 150 minutes of moderate-intensity exercise (any activity that increases your heart rate and causes you to sweat) each week. In addition, most adults need muscle-strengthening exercises on 2 or more days a week.   Maintain a healthy weight. The body mass index (BMI) is a screening tool to identify possible weight problems. It provides an estimate of body fat based on height and weight. Your health care provider can find your BMI and can help you achieve or maintain a healthy weight. For males 20 years and  older:  A BMI below 18.5 is considered underweight.  A BMI of 18.5 to 24.9 is normal.  A BMI of 25 to 29.9 is considered overweight.  A BMI of 30 and above is considered obese.  Maintain normal blood lipids and cholesterol by exercising and minimizing your intake of saturated fat. Eat a balanced diet with plenty of fruits and vegetables. Blood tests for lipids and cholesterol should begin at age 47 and be repeated every 5 years. If your lipid or cholesterol levels are high, you are over age 32, or you are at high risk for heart disease, you may need your cholesterol levels checked more frequently.Ongoing high lipid and cholesterol levels should be treated with medicines if diet and exercise are not working.  If you smoke, find out from your health care provider how to quit. If you do not use tobacco, do not start.  Lung cancer screening is recommended for adults aged 13-80 years who are at high risk for developing lung cancer because of a history of smoking. A yearly low-dose CT scan of the lungs is recommended for people who have at least a 30-pack-year history of smoking and are current smokers or have quit within the past 15 years. A pack year of smoking is smoking an average of 1 pack of cigarettes a day for 1 year (for example, a 30-pack-year history of smoking could mean smoking 1 pack a day for 30 years or 2 packs a day for 15 years). Yearly screening should continue until the smoker has stopped smoking for at least 15 years. Yearly screening should be  stopped for people who develop a health problem that would prevent them from having lung cancer treatment.  If you choose to drink alcohol, do not have more than 2 drinks per day. One drink is considered to be 12 oz (360 mL) of beer, 5 oz (150 mL) of wine, or 1.5 oz (45 mL) of liquor.  Avoid the use of street drugs. Do not share needles with anyone. Ask for help if you need support or instructions about stopping the use of drugs.  High  blood pressure causes heart disease and increases the risk of stroke. High blood pressure is more likely to develop in:  People who have blood pressure in the end of the normal range (100-139/85-89 mm Hg).  People who are overweight or obese.  People who are African American.  If you are 58-61 years of age, have your blood pressure checked every 3-5 years. If you are 69 years of age or older, have your blood pressure checked every year. You should have your blood pressure measured twice-once when you are at a hospital or clinic, and once when you are not at a hospital or clinic. Record the average of the two measurements. To check your blood pressure when you are not at a hospital or clinic, you can use:  An automated blood pressure machine at a pharmacy.  A home blood pressure monitor.  If you are 31-68 years old, ask your health care provider if you should take aspirin to prevent heart disease.  Diabetes screening involves taking a blood sample to check your fasting blood sugar level. This should be done once every 3 years after age 75 if you are at a normal weight and without risk factors for diabetes. Testing should be considered at a younger age or be carried out more frequently if you are overweight and have at least 1 risk factor for diabetes.  Colorectal cancer can be detected and often prevented. Most routine colorectal cancer screening begins at the age of 11 and continues through age 29. However, your health care provider may recommend screening at an earlier age if you have risk factors for colon cancer. On a yearly basis, your health care provider may provide home test kits to check for hidden blood in the stool. A small camera at the end of a tube may be used to directly examine the colon (sigmoidoscopy or colonoscopy) to detect the earliest forms of colorectal cancer. Talk to your health care provider about this at age 9 when routine screening begins. A direct exam of the colon  should be repeated every 5-10 years through age 26, unless early forms of precancerous polyps or small growths are found.  People who are at an increased risk for hepatitis B should be screened for this virus. You are considered at high risk for hepatitis B if:  You were born in a country where hepatitis B occurs often. Talk with your health care provider about which countries are considered high risk.  Your parents were born in a high-risk country and you have not received a shot to protect against hepatitis B (hepatitis B vaccine).  You have HIV or AIDS.  You use needles to inject street drugs.  You live with, or have sex with, someone who has hepatitis B.  You are a man who has sex with other men (MSM).  You get hemodialysis treatment.  You take certain medicines for conditions like cancer, organ transplantation, and autoimmune conditions.  Hepatitis C blood testing is recommended  for all people born from [birth year] through 1966 and any individual with known risk factors for hepatitis C.  Healthy men should no longer receive prostate-specific antigen (PSA) blood tests as part of routine cancer screening. Talk to your health care provider about prostate cancer screening.  Testicular cancer screening is not recommended for adolescents or adult males who have no symptoms. Screening includes self-exam, a health care provider exam, and other screening tests. Consult with your health care provider about any symptoms you have or any concerns you have about testicular cancer.  Practice safe sex. Use condoms and avoid high-risk sexual practices to reduce the spread of sexually transmitted infections (STIs).  You should be screened for STIs, including gonorrhea and chlamydia if:  You are sexually active and are younger than 24 years.  You are older than 24 years, and your health care provider tells you that you are at risk for this type of infection.  Your sexual activity has changed since you  were last screened, and you are at an increased risk for chlamydia or gonorrhea. Ask your health care provider if you are at risk.  If you are at risk of being infected with HIV, it is recommended that you take a prescription medicine daily to prevent HIV infection. This is called pre-exposure prophylaxis (PrEP). You are considered at risk if:  You are a man who has sex with other men (MSM).  You are a heterosexual man who is sexually active with multiple partners.  You take drugs by injection.  You are sexually active with a partner who has HIV.  Talk with your health care provider about whether you are at high risk of being infected with HIV. If you choose to begin PrEP, you should first be tested for HIV. You should then be tested every 3 months for as long as you are taking PrEP.  Use sunscreen. Apply sunscreen liberally and repeatedly throughout the day. You should seek shade when your shadow is shorter than you. Protect yourself by wearing long sleeves, pants, a wide-brimmed hat, and sunglasses year round whenever you are outdoors.  Tell your health care provider of new moles or changes in moles, especially if there is a change in shape or color. Also, tell your health care provider if a mole is larger than the size of a pencil eraser.  A one-time screening for abdominal aortic aneurysm (AAA) and surgical repair of large AAAs by ultrasound is recommended for men aged 31-75 years who are current or former smokers.  Stay current with your vaccines (immunizations). This information is not intended to replace advice given to you by your health care provider. Make sure you discuss any questions you have with your health care provider. Document Released: 10/20/2007 Document Revised: 05/14/2014 Document Reviewed: 01/25/2015 Elsevier Interactive Patient Education  2017 [employer].

## 2016-04-07 LAB — PSA: PSA: 1.9 ng/mL (ref <=4.0)

## 2016-10-08 ENCOUNTER — Other Ambulatory Visit: Payer: Self-pay | Admitting: Emergency Medicine

## 2016-10-09 NOTE — Telephone Encounter (Signed)
04/2016 last ov and refill daub

## 2016-10-22 NOTE — Telephone Encounter (Signed)
Oscar [name] IS WANTING A REFILL ON HIS [place] CALL AND MAKE AND APPOINTMENT FOR ESTABLISH CARE WITH YOU [name] ONLT HAVE A COUPLE MORE DAYS OF MEDICINE HE USES IT FOR SLEEP Nilda Simmer [place] AT [phone]

## 2016-10-23 ENCOUNTER — Telehealth: Payer: Self-pay

## 2016-10-23 NOTE — Telephone Encounter (Signed)
Meds ordered this encounter  Medications  . clonazePAM (KLONOPIN) 1 MG tablet    Sig: TAKE 1 TO 1&1/2 TABLETS BY MOUTH AT NIGHT AS NEEDED FOR SLEEP    Dispense:  100 tablet    Refill:  0    This request is for a new prescription for a controlled substance as required by Federal/State law.

## 2017-01-12 ENCOUNTER — Other Ambulatory Visit: Payer: Self-pay | Admitting: Physician Assistant

## 2017-01-15 NOTE — Telephone Encounter (Signed)
Meds ordered this encounter  Medications  . clonazePAM (KLONOPIN) 1 MG tablet    Sig: TAKE 1 TO 1 AND 1/2 TABLETS AT NIGHT AS NEEDED FOR SLEEP    Dispense:  100 tablet    Refill:  0    Not to exceed 5 additional fills before 04/21/2017

## 2017-05-03 NOTE — Telephone Encounter (Signed)
Error-Close Encounter 

## 2017-05-07 ENCOUNTER — Other Ambulatory Visit: Payer: Self-pay | Admitting: Physician Assistant

## 2017-05-08 NOTE — Telephone Encounter (Signed)
Controlled substance 

## 2017-05-09 NOTE — Telephone Encounter (Signed)
Please advise 

## 2017-05-12 NOTE — Telephone Encounter (Signed)
Rx sent electronically.  Meds ordered this encounter  Medications  . clonazePAM (KLONOPIN) 1 MG tablet    Sig: TAKE 1 TO 1 AND 1/2 TABLET BY MOUTH AT BEDTIME AS NEEDED FOR SLEEP    Dispense:  45 tablet    Refill:  0    Not to exceed 5 additional fills before 07/14/2017    Please call patient to advise of Rx authorization (30-days), and need for OV before additional fills can be authorized.   Previously saw Dr. Everlene Farrier. Last visit 04/2016.

## 2017-10-16 ENCOUNTER — Other Ambulatory Visit: Payer: Self-pay | Admitting: Physician Assistant

## 2017-10-17 NOTE — Telephone Encounter (Signed)
LOV  04/06/16 Chelle Dellis Filbert Last refill 05/12/17  # 45 with 0 refill

## 2018-01-10 ENCOUNTER — Encounter: Payer: Self-pay | Admitting: Family Medicine

## 2018-01-10 ENCOUNTER — Encounter

## 2018-01-10 ENCOUNTER — Ambulatory Visit (INDEPENDENT_AMBULATORY_CARE_PROVIDER_SITE_OTHER): Payer: BLUE CROSS/BLUE SHIELD | Admitting: Family Medicine

## 2018-01-10 VITALS — BP 138/84 | HR 69 | Temp 98.5°F | Ht 74.0 in | Wt 248.0 lb

## 2018-01-10 DIAGNOSIS — N401 Enlarged prostate with lower urinary tract symptoms: Secondary | ICD-10-CM | POA: Diagnosis not present

## 2018-01-10 DIAGNOSIS — Z87442 Personal history of urinary calculi: Secondary | ICD-10-CM | POA: Insufficient documentation

## 2018-01-10 DIAGNOSIS — Z0000 Encounter for general adult medical examination without abnormal findings: Secondary | ICD-10-CM

## 2018-01-10 DIAGNOSIS — F5101 Primary insomnia: Secondary | ICD-10-CM

## 2018-01-10 LAB — COMPREHENSIVE METABOLIC PANEL
ALT: 34 U/L (ref 0–53)
AST: 18 U/L (ref 0–37)
Albumin: 4.4 g/dL (ref 3.5–5.2)
Alkaline Phosphatase: 74 U/L (ref 39–117)
BILIRUBIN TOTAL: 0.5 mg/dL (ref 0.2–1.2)
BUN: 15 mg/dL (ref 6–23)
CALCIUM: 9.3 mg/dL (ref 8.4–10.5)
CO2: 25 meq/L (ref 19–32)
CREATININE: 0.85 mg/dL (ref 0.40–1.50)
Chloride: 107 mEq/L (ref 96–112)
GFR: 96.63 mL/min (ref >60.00)
GLUCOSE: 105 mg/dL — AB (ref 70–99)
Potassium: 4.6 mEq/L (ref 3.5–5.1)
SODIUM: 140 meq/L (ref 135–145)
Total Protein: 7 g/dL (ref 6.0–8.3)

## 2018-01-10 LAB — LIPID PANEL
CHOL/HDL RATIO: 4
Cholesterol: 198 mg/dL (ref 0–200)
HDL: 53.6 mg/dL (ref >39.00)
LDL Cholesterol: 117 mg/dL — ABNORMAL HIGH (ref 0–99)
NonHDL: 144.22
Triglycerides: 134 mg/dL (ref 0.0–149.0)
VLDL: 26.8 mg/dL (ref 0.0–40.0)

## 2018-01-10 LAB — PSA: PSA: 3.19 ng/mL (ref 0.10–4.00)

## 2018-01-10 LAB — TSH: TSH: 1.32 u[IU]/mL (ref 0.35–4.50)

## 2018-01-10 LAB — CBC WITH DIFFERENTIAL/PLATELET
BASOS ABS: 0 10*3/uL (ref 0.0–0.1)
BASOS PCT: 0.7 % (ref 0.0–3.0)
EOS ABS: 0.2 10*3/uL (ref 0.0–0.7)
EOS PCT: 2.2 % (ref 0.0–5.0)
HEMATOCRIT: 46.5 % (ref 39.0–52.0)
Hemoglobin: 16.3 g/dL (ref 13.0–17.0)
Lymphocytes Relative: 31.8 % (ref 12.0–46.0)
Lymphs Abs: 2.2 10*3/uL (ref 0.7–4.0)
MCHC: 35 g/dL (ref 30.0–36.0)
MCV: 99.1 fl (ref 78.0–100.0)
MONOS PCT: 12 % (ref 3.0–12.0)
Monocytes Absolute: 0.8 10*3/uL (ref 0.1–1.0)
NEUTROS ABS: 3.7 10*3/uL (ref 1.4–7.7)
NEUTROS PCT: 53.3 % (ref 43.0–77.0)
PLATELETS: 224 10*3/uL (ref 150.0–400.0)
RBC: 4.69 Mil/uL (ref 4.22–5.81)
RDW: 13.1 % (ref 11.5–15.5)
WBC: 6.9 10*3/uL (ref 4.0–10.5)

## 2018-01-10 MED ORDER — ONDANSETRON HCL 4 MG PO TABS: 4.0000 mg | ORAL_TABLET | Freq: Three times a day (TID) | ORAL | 0 refills | Status: DC | PRN

## 2018-01-10 MED ORDER — SCOPOLAMINE 1 MG/3DAYS TD PT72: 1.0000 | MEDICATED_PATCH | TRANSDERMAL | 0 refills | Status: DC

## 2018-01-10 MED ORDER — CLONAZEPAM 1 MG PO TABS: 1.0000 mg | ORAL_TABLET | Freq: Every day | ORAL | 0 refills | Status: DC

## 2018-01-10 MED ORDER — TRAZODONE HCL 100 MG PO TABS: ORAL_TABLET | ORAL | 3 refills | Status: DC

## 2018-01-10 NOTE — Progress Notes (Signed)
Patient: Oscar [name] MRN: [id number] DOB: 01/27/1956 PCP: Orma Flaming, MD     Subjective:  Chief Complaint  Patient presents with  . Establish Care    HPI: The patient is a 62 y.o. male who presents today for annual exam. He denies any changes to past medical history. There have been no recent hospitalizations. They are following a well balanced diet and exercise plan. Weight has been stable. No complaints today. He [place] [address] so works hard, trying to get back into an actual exercise plan.   He is going on a cruise to [place] and does get sea sick. Needing some medication.   Insomnia: long history of insomnia. Has been on trazodone and klonopin for over 10 years. Gets about 5 hours of sleep/night. He tries to practice good sleep hygiene. He does no screen time 30 minutes before bed, sleep for bed and sex only and gets out and moves during the day. He does drink caffeine late in the day. He is needing refill on his medication. Main complaint is he has a hard time "shutting off his mind."   Immunization History  Administered Date(s) Administered  . Influenza,inj,Quad PF,6+ Mos 03/02/2014, 03/24/2015, 04/06/2016  . Tdap 11/13/2010   Colonoscopy: 12/27/2010: f/u in 10 years PSA: today  Tdap: 11/2010  Review of Systems  Constitutional: Negative for chills, fatigue and fever.  HENT: Negative for dental problem, ear pain, hearing loss and trouble swallowing.   Eyes: Negative for visual disturbance.  Respiratory: Negative for cough, chest tightness and shortness of breath.   Cardiovascular: Negative for chest pain, palpitations and leg swelling.  Gastrointestinal: Negative for abdominal pain, blood in stool, diarrhea and nausea.  Endocrine: Negative for cold intolerance, polydipsia, polyphagia and polyuria.  Genitourinary: Negative for dysuria and hematuria.  Musculoskeletal: Negative for arthralgias.  Skin: Negative for rash.  Neurological: Negative for dizziness and  headaches.  Psychiatric/Behavioral: Positive for sleep disturbance. Negative for dysphoric mood. The patient is not nervous/anxious.     Allergies Patient is allergic to poison ivy treatments [poison ivy treatments] and pollen extract.  Past Medical History Patient  has a past medical history of Allergy, Cataract, Chronic kidney disease, Hyperlipidemia, and Insomnia.  Surgical History Patient  has a past surgical history that includes thumb surgery; Foot surgery; Shoulder arthroscopy; and Vasectomy.  Family History Pateint's family history includes Cancer in his brother, father, and mother; Heart disease in his father.  Social History Patient  reports that he has never smoked. He has never used smokeless tobacco. He reports that he drinks about 3.0 standard drinks of alcohol per week. He reports that he does not use drugs.    Objective: Vitals:   01/10/18 1115  BP: 138/84  Pulse: 69  Temp: 98.5 F (36.9 C)  SpO2: 96%  Weight: 248 lb (112.5 kg)  Height: 6\' 2"  (1.88 m)    Body mass index is 31.84 kg/m.  Physical Exam  Constitutional: He is oriented to person, place, and time. He appears well-developed and well-nourished.  HENT:  Right Ear: External ear normal.  Left Ear: External ear normal.  Mouth/Throat: Oropharynx is clear and moist.  Eyes: Pupils are equal, round, and reactive to light. Conjunctivae and EOM are normal.  Neck: Normal range of motion. Neck supple. No thyromegaly present.  Cardiovascular: Normal rate, regular rhythm, normal heart sounds and intact distal pulses.  No murmur heard. Pulmonary/Chest: Effort normal and breath sounds normal.  Abdominal: Soft. Bowel sounds are normal. He exhibits no distension.  There is no tenderness.  Lymphadenopathy:    He has no cervical adenopathy.  Neurological: He is alert and oriented to person, place, and time. He displays normal reflexes. No cranial nerve deficit. Coordination normal.  Skin: Skin is warm and dry. No  rash noted.  Psychiatric: He has a normal mood and affect. His behavior is normal.  Vitals reviewed.      Assessment/plan: 1. Annual physical exam Doing great. utd on health maintenance. Routine lab work today. Advised that he try to incorporate actual planned cardio exercise into his week. Discussed sun safety as well. F/u in one year or as needed.  Patient counseling [x]    Nutrition: Stressed importance of moderation in sodium/caffeine intake, saturated fat and cholesterol, caloric balance, sufficient intake of fresh fruits, vegetables, fiber, calcium, iron, and 1 mg of folate supplement per day (for females capable of pregnancy).  [x]    Stressed the importance of regular exercise.   []    Substance Abuse: Discussed cessation/primary prevention of tobacco, alcohol, or other drug use; driving or other dangerous activities under the influence; availability of treatment for abuse.   [x]    Injury prevention: Discussed safety belts, safety helmets, smoke detector, smoking near bedding or upholstery.   [x]    Sexuality: Discussed sexually transmitted diseases, partner selection, use of condoms, avoidance of unintended pregnancy  and contraceptive alternatives.  [x]    Dental health: Discussed importance of regular tooth brushing, flossing, and dental visits.  [x]    Health maintenance and immunizations reviewed. Please refer to Health maintenance section.    - CBC with Differential/Platelet - Comprehensive metabolic panel - Lipid panel - TSH  2. Primary insomnia Discussed sleep hygiene and will try to stop drinking caffeine after 2pm. He has been on medication for over 10 years and they do okay for him. Wouldn't suggest changing as he plans to retire in December. Would continue this plan until retired and then maybe discuss weaning klonopin. Will have him try ashwagandha to see if helps to turn his mind off. Stopping caffiene earlier may help and he is also going to try to incorporate some actual  cardio exercise back into his weekly schedule.    3. Benign prostatic hyperplasia with lower urinary tract symptoms, symptom details unspecified Did not tolerate flomax. Does not desire any medication. Routine screening today.  - PSA  4. Sea sickness -scolpolamine patches prn. Also zofran prn as he states he may need this.    Return in about 1 year (around 01/11/2019).     Orma Flaming, MD [place]  01/10/2018

## 2018-01-10 NOTE — Patient Instructions (Addendum)
Health Maintenance Due  Topic Date Due  . INFLUENZA VACCINE -due in Feb 2020 12/05/2017   ashwagandha 300mg  at night to help with sleep.

## 2018-01-13 ENCOUNTER — Other Ambulatory Visit: Payer: Self-pay | Admitting: Family Medicine

## 2018-01-13 ENCOUNTER — Encounter: Payer: Self-pay | Admitting: Family Medicine

## 2018-01-13 ENCOUNTER — Other Ambulatory Visit (INDEPENDENT_AMBULATORY_CARE_PROVIDER_SITE_OTHER): Payer: BLUE CROSS/BLUE SHIELD

## 2018-01-13 DIAGNOSIS — R739 Hyperglycemia, unspecified: Secondary | ICD-10-CM

## 2018-01-13 LAB — HEMOGLOBIN A1C: Hgb A1c MFr Bld: 5.6 % (ref 4.6–6.5)

## 2018-01-13 NOTE — Progress Notes (Signed)
a1c added

## 2018-01-15 ENCOUNTER — Other Ambulatory Visit: Payer: Self-pay | Admitting: Family Medicine

## 2018-01-15 DIAGNOSIS — E782 Mixed hyperlipidemia: Secondary | ICD-10-CM

## 2018-02-03 ENCOUNTER — Ambulatory Visit: Payer: Self-pay | Admitting: Family Medicine

## 2018-04-01 ENCOUNTER — Other Ambulatory Visit: Payer: Self-pay | Admitting: Family Medicine

## 2018-04-01 NOTE — Telephone Encounter (Signed)
Please see message. °

## 2018-09-29 ENCOUNTER — Other Ambulatory Visit: Payer: Self-pay | Admitting: Family Medicine

## 2018-10-01 NOTE — Telephone Encounter (Signed)
Last OV: 01/10/18 Next OV: 01/14/19 PMP website checked: Appears that previous script written in 01/2018 was never filled? No h/o of rx being filled from 09/2016 to present according to website

## 2018-10-01 NOTE — Telephone Encounter (Signed)
pmp reviewed. Filled correctly.  Orma Flaming, MD [place]

## 2019-01-14 ENCOUNTER — Encounter: Payer: BLUE CROSS/BLUE SHIELD | Admitting: Family Medicine

## 2019-03-31 ENCOUNTER — Other Ambulatory Visit: Payer: Self-pay | Admitting: Family Medicine

## 2019-04-06 ENCOUNTER — Other Ambulatory Visit: Payer: Self-pay | Admitting: Family Medicine

## 2019-04-06 NOTE — Telephone Encounter (Signed)
Patient is scheduled for Wednesday at 10am for medication refill. He requested a few pills to last him until then as he is completely out.   clonazePAM (KLONOPIN) 1 MG tablet  traZODone (DESYREL) 100 MG tablet  Please contact patient on mobile phone at [phone] to advise if this is an option.

## 2019-04-07 MED ORDER — TRAZODONE HCL 100 MG PO TABS: ORAL_TABLET | ORAL | 3 refills | Status: DC

## 2019-04-07 MED ORDER — CLONAZEPAM 1 MG PO TABS: ORAL_TABLET | ORAL | 0 refills | Status: DC

## 2019-04-07 NOTE — Telephone Encounter (Signed)
Let him know I sent in medication. Will see him Wednesday.  Orma Flaming, MD [place]

## 2019-04-07 NOTE — Addendum Note (Signed)
Addended by: Orma Flaming on: 04/07/2019 08:47 AM   Modules accepted: Orders

## 2019-04-08 ENCOUNTER — Telehealth: Payer: Self-pay | Admitting: Family Medicine

## 2019-04-08 ENCOUNTER — Encounter: Payer: Self-pay | Admitting: Family Medicine

## 2019-04-08 ENCOUNTER — Ambulatory Visit (INDEPENDENT_AMBULATORY_CARE_PROVIDER_SITE_OTHER): Payer: BC Managed Care – PPO | Admitting: Family Medicine

## 2019-04-08 ENCOUNTER — Other Ambulatory Visit: Payer: Self-pay

## 2019-04-08 VITALS — BP 140/78 | HR 84 | Temp 98.1°F | Ht 74.0 in | Wt 252.0 lb

## 2019-04-08 DIAGNOSIS — Z0000 Encounter for general adult medical examination without abnormal findings: Secondary | ICD-10-CM | POA: Diagnosis not present

## 2019-04-08 DIAGNOSIS — E782 Mixed hyperlipidemia: Secondary | ICD-10-CM | POA: Diagnosis not present

## 2019-04-08 DIAGNOSIS — F5101 Primary insomnia: Secondary | ICD-10-CM

## 2019-04-08 DIAGNOSIS — R011 Cardiac murmur, unspecified: Secondary | ICD-10-CM | POA: Diagnosis not present

## 2019-04-08 DIAGNOSIS — Z23 Encounter for immunization: Secondary | ICD-10-CM

## 2019-04-08 DIAGNOSIS — R030 Elevated blood-pressure reading, without diagnosis of hypertension: Secondary | ICD-10-CM

## 2019-04-08 DIAGNOSIS — N401 Enlarged prostate with lower urinary tract symptoms: Secondary | ICD-10-CM | POA: Diagnosis not present

## 2019-04-08 LAB — CBC WITH DIFFERENTIAL/PLATELET
Basophils Absolute: 0 10*3/uL (ref 0.0–0.1)
Basophils Relative: 0.8 % (ref 0.0–3.0)
Eosinophils Absolute: 0.1 10*3/uL (ref 0.0–0.7)
Eosinophils Relative: 2.1 % (ref 0.0–5.0)
HCT: 47.1 % (ref 39.0–52.0)
Hemoglobin: 16.1 g/dL (ref 13.0–17.0)
Lymphocytes Relative: 27.6 % (ref 12.0–46.0)
Lymphs Abs: 1.8 10*3/uL (ref 0.7–4.0)
MCHC: 34.2 g/dL (ref 30.0–36.0)
MCV: 100.3 fl — ABNORMAL HIGH (ref 78.0–100.0)
Monocytes Absolute: 0.8 10*3/uL (ref 0.1–1.0)
Monocytes Relative: 12.6 % — ABNORMAL HIGH (ref 3.0–12.0)
Neutro Abs: 3.7 10*3/uL (ref 1.4–7.7)
Neutrophils Relative %: 56.9 % (ref 43.0–77.0)
Platelets: 207 10*3/uL (ref 150.0–400.0)
RBC: 4.7 Mil/uL (ref 4.22–5.81)
RDW: 12.9 % (ref 11.5–15.5)
WBC: 6.5 10*3/uL (ref 4.0–10.5)

## 2019-04-08 LAB — LIPID PANEL
Cholesterol: 240 mg/dL — ABNORMAL HIGH (ref 0–200)
HDL: 58.5 mg/dL (ref >39.00)
LDL Cholesterol: 152 mg/dL — ABNORMAL HIGH (ref 0–99)
NonHDL: 181.49
Total CHOL/HDL Ratio: 4
Triglycerides: 146 mg/dL (ref 0.0–149.0)
VLDL: 29.2 mg/dL (ref 0.0–40.0)

## 2019-04-08 LAB — COMPREHENSIVE METABOLIC PANEL
ALT: 29 U/L (ref 0–53)
AST: 20 U/L (ref 0–37)
Albumin: 4.5 g/dL (ref 3.5–5.2)
Alkaline Phosphatase: 70 U/L (ref 39–117)
BUN: 15 mg/dL (ref 6–23)
CO2: 27 mEq/L (ref 19–32)
Calcium: 9.4 mg/dL (ref 8.4–10.5)
Chloride: 102 mEq/L (ref 96–112)
Creatinine, Ser: 0.89 mg/dL (ref 0.40–1.50)
GFR: 85.88 mL/min (ref >60.00)
Glucose, Bld: 104 mg/dL — ABNORMAL HIGH (ref 70–99)
Potassium: 4.1 mEq/L (ref 3.5–5.1)
Sodium: 139 mEq/L (ref 135–145)
Total Bilirubin: 0.7 mg/dL (ref 0.2–1.2)
Total Protein: 7.1 g/dL (ref 6.0–8.3)

## 2019-04-08 LAB — TSH: TSH: 1.84 u[IU]/mL (ref 0.35–4.50)

## 2019-04-08 LAB — PSA: PSA: 3.39 ng/mL (ref 0.10–4.00)

## 2019-04-08 MED ORDER — CLONAZEPAM 1 MG PO TABS: ORAL_TABLET | ORAL | 1 refills | Status: DC

## 2019-04-08 MED ORDER — TRAZODONE HCL 100 MG PO TABS: ORAL_TABLET | ORAL | 3 refills | Status: DC

## 2019-04-08 NOTE — Telephone Encounter (Signed)
Patient states that zofran was not called in to pharmacy. Patient uses [id number] - [place], [place] - [address]

## 2019-04-08 NOTE — Telephone Encounter (Signed)
I did not know he needed zofran???? Can we clarify what he is needing pleaes?  Dr. Rogers Blocker

## 2019-04-08 NOTE — Progress Notes (Signed)
Patient: Oscar [name] MRN: [id number] DOB: Nov 03, 1955 PCP: Orma Flaming, MD     Subjective:  Chief Complaint  Patient presents with  . Annual Exam  . Hyperlipidemia  . Insomnia    HPI: The patient is a 63 y.o. male who presents today for annual exam. He denies any changes to past medical history. There have been no recent hospitalizations. They are following a well balanced diet and exercise plan. Weight has been stable.   Hyperlipidemia: ascvd risk of 11.8% last year. He did not want to start a statin. No family history of heart disease in his parents. He does not smoke, no diabetes.   BPH: does not tolerate flomax. No medication at this time. He takes salpametto and over the counter medications helps a lot.   Insomnia: long history of insomnia. Has been on trazodone and klonopin for over 10 years. Practices good sleepy hygiene. Did suggest sleep ashwagandha at his last physicial and stopping caffeine after 2pm. He states the ashwagandha didn't help him. He has been off work for a year and this doesn't seem to affect his sleep. He is not exercising like he should since of covid. He is doing walking, but not like he was doing before covid.    Immunization History  Administered Date(s) Administered  . Influenza,inj,Quad PF,6+ Mos 03/02/2014, 03/24/2015, 04/06/2016  . Influenza-Unspecified 02/12/2019  . Tdap 11/13/2010   Colonoscopy: 12/27/2010. Repeat in 10 years.  PSA: today Flu shot done this year.   Review of Systems  Constitutional: Negative for fatigue.  HENT: Negative for congestion, postnasal drip, rhinorrhea and sore throat.   Eyes: Negative for visual disturbance.  Respiratory: Negative for shortness of breath.   Cardiovascular: Negative for chest pain, palpitations and leg swelling.  Gastrointestinal: Negative for abdominal pain, constipation, diarrhea, nausea and vomiting.  Endocrine: Negative for cold intolerance, heat intolerance, polydipsia and polyuria.   Genitourinary: Negative for dysuria, frequency and urgency.  Skin: Negative for rash.  Neurological: Negative for dizziness and headaches.  Psychiatric/Behavioral: Negative for sleep disturbance.    Allergies Patient is allergic to poison ivy treatments [poison ivy treatments] and pollen extract.  Past Medical History Patient  has a past medical history of Allergy, Cataract, Chronic kidney disease, Hyperlipidemia, and Insomnia.  Surgical History Patient  has a past surgical history that includes thumb surgery; Foot surgery; Shoulder arthroscopy; Vasectomy; and Tonsillectomy.  Family History Pateint's family history includes Arthritis in his maternal grandfather and maternal grandmother; Cancer in his brother, father, and mother; Heart attack in his maternal grandfather; Heart disease in his father and maternal grandfather.  Social History Patient  reports that he has never smoked. He has never used smokeless tobacco. He reports current alcohol use of about 3.0 standard drinks of alcohol per week. He reports that he does not use drugs.    Objective: Vitals:   04/08/19 0957 04/08/19 1032  BP: (!) 156/76 140/78  Pulse: 84   Temp: 98.1 F (36.7 C)   TempSrc: Skin   SpO2: 94%   Weight: 252 lb (114.3 kg)   Height: 6\' 2"  (1.88 m)     Body mass index is 32.35 kg/m.  Physical Exam Vitals signs reviewed.  Constitutional:      Appearance: Normal appearance. He is well-developed. He is obese.  HENT:     Head: Normocephalic and atraumatic.     Right Ear: Tympanic membrane, ear canal and external ear normal.     Left Ear: Tympanic membrane, ear canal and external ear normal.  Nose: Nose normal.     Mouth/Throat:     Mouth: Mucous membranes are moist.  Eyes:     Extraocular Movements: Extraocular movements intact.     Conjunctiva/sclera: Conjunctivae normal.     Pupils: Pupils are equal, round, and reactive to light.  Neck:     Musculoskeletal: Normal range of motion and  neck supple.     Thyroid: No thyromegaly.  Cardiovascular:     Rate and Rhythm: Normal rate and regular rhythm.     Pulses: Normal pulses.     Heart sounds: Murmur (harsh systolic murmur) present.  Pulmonary:     Effort: Pulmonary effort is normal.     Breath sounds: Normal breath sounds.  Abdominal:     General: Bowel sounds are normal. There is no distension.     Palpations: Abdomen is soft.     Tenderness: There is no abdominal tenderness.  Lymphadenopathy:     Cervical: No cervical adenopathy.  Skin:    General: Skin is warm and dry.     Findings: No rash.  Neurological:     General: No focal deficit present.     Mental Status: He is alert and oriented to person, place, and time.     Cranial Nerves: No cranial nerve deficit.     Coordination: Coordination normal.     Deep Tendon Reflexes: Reflexes normal.  Psychiatric:        Mood and Affect: Mood normal.        Behavior: Behavior normal.         Office Visit from 04/08/2019 in [facility]  PHQ-2 Total Score  0      Assessment/plan: 1. Annual physical exam Routine fasting labs today. utd on HM. shingrix today. Encouraged exercise/diet. Monitor BP. F/u in one year or as needed.  Patient counseling [x]    Nutrition: Stressed importance of moderation in sodium/caffeine intake, saturated fat and cholesterol, caloric balance, sufficient intake of fresh fruits, vegetables, fiber, calcium, iron, and 1 mg of folate supplement per day (for females capable of pregnancy).  [x]    Stressed the importance of regular exercise.   []    Substance Abuse: Discussed cessation/primary prevention of tobacco, alcohol, or other drug use; driving or other dangerous activities under the influence; availability of treatment for abuse.   [x]    Injury prevention: Discussed safety belts, safety helmets, smoke detector, smoking near bedding or upholstery.   [x]    Sexuality: Discussed sexually transmitted diseases, partner  selection, use of condoms, avoidance of unintended pregnancy  and contraceptive alternatives.  [x]    Dental health: Discussed importance of regular tooth brushing, flossing, and dental visits.  [x]    Health maintenance and immunizations reviewed. Please refer to Health maintenance section.    - CBC with Differential/Platelet - Comprehensive metabolic panel - TSH  2. Benign prostatic hyperplasia with lower urinary tract symptoms, symptom details unspecified  - PSA  3. Mixed hyperlipidemia Last year ASCVD risk was over 11%. Discussed risk and declines statin therapy at this time. Encouraged lifestyle changes.  - Lipid panel  4. Primary insomnia Drug contract signed for his klonopin. Controlled on his trazodone and klonopin medication. ashwagandha did not help and retiring did not help. pmp reviewed. Refills given.   5. Need for shingles vaccine  - Varicella-zoster vaccine IM (Shingrix)  6. Murmur Quite harsh, getting echo. R/o aortic stenosis.  - ECHOCARDIOGRAM COMPLETE; Future  7. Elevated blood pressure reading Borderline. Keep log for me. Cut out salt and start exercising  again. F/u with me if consistently >140/90.    This visit occurred during the SARS-CoV-2 public health emergency.  Safety protocols were in place, including screening questions prior to the visit, additional usage of staff PPE, and extensive cleaning of exam room while observing appropriate contact time as indicated for disinfecting solutions.    Return in about 1 year (around 04/07/2020).   Orma Flaming, MD [facility]  04/08/2019

## 2019-04-08 NOTE — Telephone Encounter (Signed)
Spoke to patient's wife who states that he "likes to have it on hand for when they travel" and I advised that Dr. Rogers Blocker does not prescribe this medication for this reason and patient's wife verbalized understanding.

## 2019-04-08 NOTE — Patient Instructions (Signed)
1) ordered an echo of your heart to see what is causing murmur. They will call you with this.  2) refilled medication 3) blood pressure is right at borderline of high. Get cuff and keep log. Want it less than 140/90. If is above this consistently will need to see you back for appointment.  3) shingles vaccine today. Side effects: fever, achyness, soreness at site. Come back for 2nd injection.   Hypertension, Adult Hypertension is another name for high blood pressure. High blood pressure forces your heart to work harder to pump blood. This can cause problems over time. There are two numbers in a blood pressure reading. There is a top number (systolic) over a bottom number (diastolic). It is best to have a blood pressure that is below 120/80. Healthy choices can help lower your blood pressure, or you may need medicine to help lower it. What are the causes? The cause of this condition is not known. Some conditions may be related to high blood pressure. What increases the risk?  Smoking.  Having type 2 diabetes mellitus, high cholesterol, or both.  Not getting enough exercise or physical activity.  Being overweight.  Having too much fat, sugar, calories, or salt (sodium) in your diet.  Drinking too much alcohol.  Having long-term (chronic) kidney disease.  Having a family history of high blood pressure.  Age. Risk increases with age.  Race. You may be at higher risk if you are African American.  Gender. Men are at higher risk than women before age 59. After age 31, women are at higher risk than men.  Having obstructive sleep apnea.  Stress. What are the signs or symptoms?  High blood pressure may not cause symptoms. Very high blood pressure (hypertensive crisis) may cause: ? Headache. ? Feelings of worry or nervousness (anxiety). ? Shortness of breath. ? Nosebleed. ? A feeling of being sick to your stomach (nausea). ? Throwing up (vomiting). ? Changes in how you see. ? Very  bad chest pain. ? Seizures. How is this treated?  This condition is treated by making healthy lifestyle changes, such as: ? Eating healthy foods. ? Exercising more. ? Drinking less alcohol.  Your health care provider may prescribe medicine if lifestyle changes are not enough to get your blood pressure under control, and if: ? Your top number is above 130. ? Your bottom number is above 80.  Your personal target blood pressure may vary. Follow these instructions at home: Eating and drinking   If told, follow the DASH eating plan. To follow this plan: ? Fill one half of your plate at each meal with fruits and vegetables. ? Fill one fourth of your plate at each meal with whole grains. Whole grains include whole-wheat pasta, brown rice, and whole-grain bread. ? Eat or drink low-fat dairy products, such as skim milk or low-fat yogurt. ? Fill one fourth of your plate at each meal with low-fat (lean) proteins. Low-fat proteins include fish, chicken without skin, eggs, beans, and tofu. ? Avoid fatty meat, cured and processed meat, or chicken with skin. ? Avoid pre-made or processed food.  Eat less than 1,500 mg of salt each day.  Do not drink alcohol if: ? Your doctor tells you not to drink. ? You are pregnant, may be pregnant, or are planning to become pregnant.  If you drink alcohol: ? Limit how much you use to:  0-1 drink a day for women.  0-2 drinks a day for men. ? Be aware of how  much alcohol is in your drink. In the [place] one drink equals one 12 oz bottle of beer (355 mL), one 5 oz glass of wine (148 mL), or one 1 oz glass of hard liquor (44 mL). Lifestyle   Work with your doctor to stay at a healthy weight or to lose weight. Ask your doctor what the best weight is for you.  Get at least 30 minutes of exercise most days of the week. This may include walking, swimming, or biking.  Get at least 30 minutes of exercise that strengthens your muscles (resistance exercise) at  least 3 days a week. This may include lifting weights or doing Pilates.  Do not use any products that contain nicotine or tobacco, such as cigarettes, e-cigarettes, and chewing tobacco. If you need help quitting, ask your doctor.  Check your blood pressure at home as told by your doctor.  Keep all follow-up visits as told by your doctor. This is important. Medicines  Take over-the-counter and prescription medicines only as told by your doctor. Follow directions carefully.  Do not skip doses of blood pressure medicine. The medicine does not work as well if you skip doses. Skipping doses also puts you at risk for problems.  Ask your doctor about side effects or reactions to medicines that you should watch for. Contact a doctor if you:  Think you are having a reaction to the medicine you are taking.  Have headaches that keep coming back (recurring).  Feel dizzy.  Have swelling in your ankles.  Have trouble with your vision. Get help right away if you:  Get a very bad headache.  Start to feel mixed up (confused).  Feel weak or numb.  Feel faint.  Have very bad pain in your: ? Chest. ? Belly (abdomen).  Throw up more than once.  Have trouble breathing. Summary  Hypertension is another name for high blood pressure.  High blood pressure forces your heart to work harder to pump blood.  For most people, a normal blood pressure is less than 120/80.  Making healthy choices can help lower blood pressure. If your blood pressure does not get lower with healthy choices, you may need to take medicine. This information is not intended to replace advice given to you by your health care provider. Make sure you discuss any questions you have with your health care provider. Document Released: 10/10/2007 Document Revised: 01/01/2018 Document Reviewed: 01/01/2018 Elsevier Patient Education  2020 Else[employer] Inc.   Preventive Care 6-39 Years Old, Male Preventive care refers to  lifestyle choices and visits with your health care provider that can promote health and wellness. This includes:  A yearly physical exam. This is also called an annual well check.  Regular dental and eye exams.  Immunizations.  Screening for certain conditions.  Healthy lifestyle choices, such as eating a healthy diet, getting regular exercise, not using drugs or products that contain nicotine and tobacco, and limiting alcohol use. What can I expect for my preventive care visit? Physical exam Your health care provider will check:  Height and weight. These may be used to calculate body mass index (BMI), which is a measurement that tells if you are at a healthy weight.  Heart rate and blood pressure.  Your skin for abnormal spots. Counseling Your health care provider may ask you questions about:  Alcohol, tobacco, and drug use.  Emotional well-being.  Home and relationship well-being.  Sexual activity.  Eating habits.  Work and work Statistician. What immunizations do I  need?  Influenza (flu) vaccine  This is recommended every year. Tetanus, diphtheria, and pertussis (Tdap) vaccine  You may need a Td booster every 10 years. Varicella (chickenpox) vaccine  You may need this vaccine if you have not already been vaccinated. Zoster (shingles) vaccine  You may need this after age 51. Measles, mumps, and rubella (MMR) vaccine  You may need at least one dose of MMR if you were born in 1957 or later. You may also need a second dose. Pneumococcal conjugate (PCV13) vaccine  You may need this if you have certain conditions and were not previously vaccinated. Pneumococcal polysaccharide (PPSV23) vaccine  You may need one or two doses if you smoke cigarettes or if you have certain conditions. Meningococcal conjugate (MenACWY) vaccine  You may need this if you have certain conditions. Hepatitis A vaccine  You may need this if you have certain conditions or if you travel  or work in places where you may be exposed to hepatitis A. Hepatitis B vaccine  You may need this if you have certain conditions or if you travel or work in places where you may be exposed to hepatitis B. Haemophilus influenzae type b (Hib) vaccine  You may need this if you have certain risk factors. Human papillomavirus (HPV) vaccine  If recommended by your health care provider, you may need three doses over 6 months. You may receive vaccines as individual doses or as more than one vaccine together in one shot (combination vaccines). Talk with your health care provider about the risks and benefits of combination vaccines. What tests do I need? Blood tests  Lipid and cholesterol levels. These may be checked every 5 years, or more frequently if you are over 68 years old.  Hepatitis C test.  Hepatitis B test. Screening  Lung cancer screening. You may have this screening every year starting at age 47 if you have a 30-pack-year history of smoking and currently smoke or have quit within the past 15 years.  Prostate cancer screening. Recommendations will vary depending on your family history and other risks.  Colorectal cancer screening. All adults should have this screening starting at age 30 and continuing until age 66. Your health care provider may recommend screening at age 61 if you are at increased risk. You will have tests every 1-10 years, depending on your results and the type of screening test.  Diabetes screening. This is done by checking your blood sugar (glucose) after you have not eaten for a while (fasting). You may have this done every 1-3 years.  Sexually transmitted disease (STD) testing. Follow these instructions at home: Eating and drinking  Eat a diet that includes fresh fruits and vegetables, whole grains, lean protein, and low-fat dairy products.  Take vitamin and mineral supplements as recommended by your health care provider.  Do not drink alcohol if your  health care provider tells you not to drink.  If you drink alcohol: ? Limit how much you have to 0-2 drinks a day. ? Be aware of how much alcohol is in your drink. In the [place] one drink equals one 12 oz bottle of beer (355 mL), one 5 oz glass of wine (148 mL), or one 1 oz glass of hard liquor (44 mL). Lifestyle  Take daily care of your teeth and gums.  Stay active. Exercise for at least 30 minutes on 5 or more days each week.  Do not use any products that contain nicotine or tobacco, such as cigarettes, e-cigarettes, and chewing  tobacco. If you need help quitting, ask your health care provider.  If you are sexually active, practice safe sex. Use a condom or other form of protection to prevent STIs (sexually transmitted infections).  Talk with your health care provider about taking a low-dose aspirin every day starting at age 76. What's next?  Go to your health care provider once a year for a well check visit.  Ask your health care provider how often you should have your eyes and teeth checked.  Stay up to date on all vaccines. This information is not intended to replace advice given to you by your health care provider. Make sure you discuss any questions you have with your health care provider. Document Released: 05/20/2015 Document Revised: 04/17/2018 Document Reviewed: 04/17/2018 Elsevier Patient Education  2020 [employer].

## 2019-04-09 ENCOUNTER — Other Ambulatory Visit: Payer: Self-pay | Admitting: Family Medicine

## 2019-04-09 MED ORDER — CLONAZEPAM 1 MG PO TABS: ORAL_TABLET | ORAL | 0 refills | Status: DC

## 2019-04-09 NOTE — Telephone Encounter (Signed)
Patient needed in a 90 day supply with 4 refills of clonazePAM (KLONOPIN) 1 MG tablet and only received a 30 day supply. Patient wants the medicine sent to :  [id number] - [place], [place] [phone] (Phone) [phone] (Fax)   He wants to speak directly to Dr. Shelby Mattocks team regarding this matter. Please call patient to advise.

## 2019-04-09 NOTE — Telephone Encounter (Signed)
This is a controlled dug and will not give that many refills. Will do 90 day supply with zero refills and he can call when needs refill. We have to monitor this.  Orma Flaming, MD [place]   -please Make sure he did not pick up the 30 day supply.

## 2019-04-09 NOTE — Telephone Encounter (Signed)
Please see message and advise 

## 2019-04-09 NOTE — Telephone Encounter (Signed)
Spoke to patient's wife and advised that we cannot do refills for a controlled substance.  When patient is out of medication, he can call or have his pharmacy call us and we will send in a refill at that time.

## 2019-04-10 ENCOUNTER — Ambulatory Visit (HOSPITAL_COMMUNITY): Payer: BC Managed Care – PPO | Attending: Cardiology

## 2019-04-10 ENCOUNTER — Other Ambulatory Visit: Payer: Self-pay

## 2019-04-10 DIAGNOSIS — R011 Cardiac murmur, unspecified: Secondary | ICD-10-CM | POA: Insufficient documentation

## 2019-04-11 ENCOUNTER — Other Ambulatory Visit: Payer: Self-pay | Admitting: Family Medicine

## 2019-04-11 ENCOUNTER — Encounter: Payer: Self-pay | Admitting: Family Medicine

## 2019-04-11 DIAGNOSIS — I340 Nonrheumatic mitral (valve) insufficiency: Secondary | ICD-10-CM

## 2019-04-15 ENCOUNTER — Telehealth: Payer: Self-pay | Admitting: Cardiovascular Disease

## 2019-04-15 ENCOUNTER — Telehealth: Payer: BC Managed Care – PPO | Admitting: Cardiovascular Disease

## 2019-04-15 NOTE — Telephone Encounter (Signed)
Spoke with patients spouse and appointment has been rescheduled. She wanted to know if she could come with the patient and I told her about the visitor policy. I told her we are more than glad to call her when Dr Audie Box is in the room so she can hear the entire visit and she said she is ok with that.

## 2019-04-15 NOTE — Telephone Encounter (Signed)
Mr. Tuchman was referred to [place] with concerns for severe mitral regurgitation. 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 I suspect he has at least moderate MR, maybe more but due to eccentric jet appropriate quantitation not performed. I discussed with him that a virtual visit is not appropriate as I need to evaluate him in person. He reports he is extremely active and without symptoms. He would like to wait until January to see me. This is ok for now. He will need a TEE which I explained but we can hold for now. Reassuring findings on his echocardiogram were normal LA size, E wave velocity <1.2 cm/s, and normal pulmonary pressures. He has no symptoms to suggest atrial fibrillation or heart failure. We will get him rescheduled.  [place] T. Audie Box, [place]  [address], [place], [place] [address] [phone]  8:04 AM

## 2019-05-08 NOTE — Progress Notes (Signed)
Cardiology Office Note:   Date:  05/11/2019  NAME:  Oscar [name]    MRN: [id number] DOB:  06/04/1955   PCP:  Orma Flaming, MD  Cardiologist:  Evalina Field, MD   Referring MD: Orma Flaming, MD   Chief Complaint  Patient presents with  . Mitral Regurgitation   History of Present Illness:   Oscar [name] is a 64 y.o. male with a hx of hyperlipidemia who is being seen today for the evaluation of mitral regurgitation at the request of Orma Flaming, MD. Evaluated by PCP and found to have a murmur. TTE shows moderate to severe mitral regurgitation with PMVL prolapse. Normal LA size.  He presents without symptoms.  He reports he has been healthy his entire life.  Review of his laboratory data from primary care physician shows hyperlipidemia, LDL 152.  His 10-year ASCVD risk is 13% which is intermediate.  He likely will need to be on a statin.  Most recent A1c is 5.6.  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  He reports he can carry heavy objects without any issues.  He does not exercise routinely.  He is going to work on losing weight.  Regarding his mitral valve, no history of prior heart disease.  He reports this is all new to him when he was found to have a murmur at his primary care physician office.  I went over his mitral valve regurgitation with him and the need for a transesophageal echocardiogram.  He understands this.  But would like to wait until April when his Medicare kicks in.  Given his lack of symptoms I think this is okay.  He denies any chest pain, shortness of breath, lower extremity edema.  No palpitations suggest atrial fibrillation.  No limitations.  He is a never smoker, rarely consumes alcohol and uses no drugs.  No strong family history of heart disease.  Problem List 1. Moderate to severe mitral regurgitation, PMVL prolapse  04/2019  Past Medical History: Past Medical History:  Diagnosis Date  . Allergy   .  Cataract   . Chronic kidney disease    [id number]  . Hyperlipidemia   . Insomnia     Past Surgical History: Past Surgical History:  Procedure Laterality Date  . FOOT SURGERY     right  . SHOULDER ARTHROSCOPY    . thumb surgery     right  . TONSILLECTOMY    . VASECTOMY      Current Medications: No outpatient medications have been marked as taking for the 05/11/19 encounter (Office Visit) with O'Neal, Cassie Freer, MD.     Allergies:    Poison ivy treatments [poison ivy treatments] and Pollen extract   Social History: Social History   Socioeconomic History  . Marital status: Married    Spouse name: Not on file  . Number of children: 4  . Years of education: Not on file  . Highest education level: Not on file  Occupational History  . Occupation: retired  Tobacco Use  . Smoking status: Never Smoker  . Smokeless tobacco: Never Used  Substance and Sexual Activity  . Alcohol use: Yes    Alcohol/week: 3.0 standard drinks    Types: 3 Cans of beer per week  . Drug use: No  . Sexual activity: Yes    Birth control/protection: Other-see comments    Comment: vasectomy  Other Topics Concern  . Not on file  Social History  Narrative   ** Merged History Encounter **       Social Determinants of Health   Financial Resource Strain:   . Difficulty of Paying Living Expenses: Not on file  Food Insecurity:   . Worried About Charity fundraiser in the Last Year: Not on file  . Ran Out of Food in the Last Year: Not on file  Transportation Needs:   . Lack of Transportation (Medical): Not on file  . Lack of Transportation (Non-Medical): Not on file  Physical Activity:   . Days of Exercise per Week: Not on file  . Minutes of Exercise per Session: Not on file  Stress:   . Feeling of Stress : Not on file  Social Connections:   . Frequency of Communication with Friends and Family: Not on file  . Frequency of Social Gatherings with Friends and Family: Not on file  . Attends  Religious Services: Not on file  . Active Member of Clubs or Organizations: Not on file  . Attends Archivist Meetings: Not on file  . Marital Status: Not on file     Family History: The patient's family history includes Arthritis in his maternal grandfather and maternal grandmother; Cancer in his brother, father, and mother; Heart attack in his maternal grandfather; Heart disease in his father and maternal grandfather. There is no history of Colon cancer, Esophageal cancer, or Stomach cancer.  ROS:   All other ROS reviewed and negative. Pertinent positives noted in the HPI.     EKGs/Labs/Other Studies Reviewed:   The following studies were personally reviewed by me today:  T chol 240 LDL 152 space HDL 58/triglycerides 146 base A1c 5.6 space creatinine 0.9 space TSH 1.84 (labs obtained by PCP on 04/08/2019)  EKG:  EKG is ordered today.  The ekg ordered today demonstrates normal sinus rhythm, heart rate 76, nonspecific interventricular conduction delay versus LVH with repolarization abnormality, no evidence of prior infarction, and was personally reviewed by me.   TTE 04/10/2019  1. The mitral valve is abnormal. Prolapse of the posterior leaflet. There is eccentric anterior directed jet. Appears to be moderate mitral regurgitation, though could be underestimating given eccentric jet. Normal left atrial size and E wave <1.2  argues against severe MR, though does have mild LV dilatation of unclear cause. Comparing MV annular inflow to LVOT SV, the regurgitant volume is 50cc (RF 34%), suggestive of moderate MR. If clinical suspicion for severe MR, consider cardiac MRI or TEE  for further evaluation.  2. Left ventricular ejection fraction, by visual estimation, is 60 to 65%. The left ventricle has normal function. There is no left ventricular hypertrophy.  3. Mildly dilated left ventricular internal cavity size.  4. Global right ventricle has normal systolic function.The right ventricular  size is normal. No increase in right ventricular wall thickness.  5. Left atrial size was normal.  6. Right atrial size was normal.  7. The tricuspid valve is normal in structure. Tricuspid valve regurgitation is not demonstrated.  8. The aortic valve is tricuspid. Aortic valve regurgitation is not visualized.  9. The pulmonic valve was not well visualized. Pulmonic valve regurgitation is not visualized. 10. There is mild dilatation of the ascending aorta measuring 36 mm. 11. TR signal is inadequate for assessing pulmonary artery systolic pressure.  Recent Labs: 04/08/2019: ALT 29; BUN 15; Creatinine, Ser 0.89; Hemoglobin 16.1; Platelets 207.0; Potassium 4.1; Sodium 139; TSH 1.84   Recent Lipid Panel    Component Value Date/Time  CHOL 240 (H) 04/08/2019 1055   TRIG 146.0 04/08/2019 1055   HDL 58.50 04/08/2019 1055   CHOLHDL 4 04/08/2019 1055   VLDL 29.2 04/08/2019 1055   LDLCALC 152 (H) 04/08/2019 1055    Physical Exam:   VS:  BP 132/85 (BP Location: Left Arm, Patient Position: Sitting, Cuff Size: Normal)   Pulse 76   Ht 6\' 2"  (1.88 m)   Wt 248 lb (112.5 kg)   SpO2 95%   BMI 31.84 kg/m    Wt Readings from Last 3 Encounters:  05/11/19 248 lb (112.5 kg)  04/08/19 252 lb (114.3 kg)  01/10/18 248 lb (112.5 kg)    General: Well nourished, well developed, in no acute distress Heart: Atraumatic, normal size  Eyes: PEERLA, EOMI  Neck: Supple, no JVD Endocrine: No thryomegaly Cardiac: 3 out of 6 holosystolic murmur best heard at the apex, radiates into the left axilla Lungs: Clear to auscultation bilaterally, no wheezing, rhonchi or rales  Abd: Soft, nontender, no hepatomegaly  Ext: No edema, pulses 2+ Musculoskeletal: No deformities, BUE and BLE strength normal and equal Skin: Warm and dry, no rashes   Neuro: Alert and oriented to person, place, time, and situation, CNII-XII grossly intact, no focal deficits  Psych: Normal mood and affect   ASSESSMENT:   Oscar [name]  is a 64 y.o. male who presents for the following: 1. Nonrheumatic mitral valve regurgitation   2. Mixed hyperlipidemia     PLAN:   1. Nonrheumatic mitral valve regurgitation -Echocardiogram shows posterior mitral valve leaflet prolapse with moderate to severe regurgitation.  Normal left atrial size and no evidence of elevated pulmonary artery pressure.  The regurgitation was poorly quantitated due to the eccentric jet that was anteriorly directed.  He may have severe MR.  No symptoms.  No reduced LV function. -I discussed with him the need for a transesophageal echocardiogram.  He wishes to wait until April for this and this is okay.  I told him that it would be necessary to determine the severity of his regurgitation as well as to get a good look at the valve for pathology and ultimately to determine if repair ability as needed.  I did discuss with him that he likely may end up having just moderate mitral vegetation and is followed.  He had several questions about if surgery will be needed, and I informed him that we need more information with a transesophageal echo prior to this.  He appears to be in good health and I think if he ever did need a mitral valve repair he would be a good candidate for this.  We will see him back in April and then get him scheduled for transesophageal study  2. Mixed hyperlipidemia -Most recent LDL 152 -10-year ASCVD risk score puts him at 13% which is intermediate.  We will discuss intermediate high intensity statin her next visit.   Disposition: Return in about 3 months (around 08/09/2019).  Medication Adjustments/Labs and Tests Ordered: Current medicines are reviewed at length with the patient today.  Concerns regarding medicines are outlined above.  Orders Placed This Encounter  Procedures  . EKG 12-Lead   No orders of the defined types were placed in this encounter.   Patient Instructions  Medication Instructions:  Your physician recommends that you  continue on your current medications as directed. Please refer to the Current Medication list given to you today.  If you need a refill on your cardiac medications before your next appointment, please call  your pharmacy.   Lab work: NONE  Testing/Procedures: NONE  Follow-Up: At [facility], you and your health needs are our priority.  As part of our continuing mission to provide you with exceptional heart care, we have created designated Provider Care Teams.  These Care Teams include your primary Cardiologist (physician) and Advanced Practice Providers (APPs -  Physician Assistants and Nurse Practitioners) who all work together to provide you with the care you need, when you need it. You may see Evalina Field, MD or one of the following Advanced Practice Providers on your designated Care Team:    Kerin Ransom, PA-C  [place], [place]  Coletta Memos, [place] Your physician wants you to follow-up in: 3 months with Dr Jenetta Downer' Nori Riis         Signed, Addison Naegeli. Audie Box, [place]  [address] [place], [place] [address] [phone]  05/11/2019 11:49 AM

## 2019-05-11 ENCOUNTER — Other Ambulatory Visit: Payer: Self-pay

## 2019-05-11 ENCOUNTER — Ambulatory Visit (INDEPENDENT_AMBULATORY_CARE_PROVIDER_SITE_OTHER): Payer: BC Managed Care – PPO | Admitting: Cardiovascular Disease

## 2019-05-11 ENCOUNTER — Encounter: Payer: Self-pay | Admitting: Cardiovascular Disease

## 2019-05-11 VITALS — BP 132/85 | HR 76 | Ht 74.0 in | Wt 248.0 lb

## 2019-05-11 DIAGNOSIS — I340 Nonrheumatic mitral (valve) insufficiency: Secondary | ICD-10-CM

## 2019-05-11 DIAGNOSIS — E782 Mixed hyperlipidemia: Secondary | ICD-10-CM

## 2019-05-11 NOTE — Patient Instructions (Signed)
Medication Instructions:  Your physician recommends that you continue on your current medications as directed. Please refer to the Current Medication list given to you today.  If you need a refill on your cardiac medications before your next appointment, please call your pharmacy.   Lab work: NONE  Testing/Procedures: NONE  Follow-Up: At [facility], you and your health needs are our priority.  As part of our continuing mission to provide you with exceptional heart care, we have created designated Provider Care Teams.  These Care Teams include your primary Cardiologist (physician) and Advanced Practice Providers (APPs -  Physician Assistants and Nurse Practitioners) who all work together to provide you with the care you need, when you need it. You may see Evalina Field, MD or one of the following Advanced Practice Providers on your designated Care Team:    Kerin Ransom, PA-C  [place], [place]  Coletta Memos, [place] Your physician wants you to follow-up in: 3 months with Dr Jenetta DownerNori Riis

## 2019-07-09 ENCOUNTER — Other Ambulatory Visit: Payer: Self-pay

## 2019-07-09 NOTE — Progress Notes (Signed)
Pt pharmacy requesting Trazadone 100mg  tab, and Clonazepam 1mg  tab  LOV: 04/08/2019 No upcoming appointment  Needs Approval, Please advise.

## 2019-07-10 MED ORDER — CLONAZEPAM 1 MG PO TABS: ORAL_TABLET | ORAL | 0 refills | Status: DC

## 2019-07-10 MED ORDER — TRAZODONE HCL 100 MG PO TABS: ORAL_TABLET | ORAL | 3 refills | Status: DC

## 2019-07-10 NOTE — Addendum Note (Signed)
Addended by: Orma Flaming on: 07/10/2019 10:44 AM   Modules accepted: Orders

## 2019-07-21 NOTE — Progress Notes (Signed)
Cardiology Office Note:   Date:  07/23/2019  NAME:  Oscar [name]    MRN: [id number] DOB:  24-Feb-1956   PCP:  Orma Flaming, MD  Cardiologist:  Evalina Field, MD   Referring MD: Orma Flaming, MD   Chief Complaint  Patient presents with  . Mitral Regurgitation   History of Present Illness:   Oscar [name] is a 64 y.o. male with a hx of mitral regurgitation, HLD who presents for follow-up of mitral regurgitation.  He reports that he is recently undergone his insurance evaluation and now has had in place.  He is interested in proceeding with a transesophageal echocardiogram.  He reports that his wife also would like to get this done.  He does report that he will go to the [place] in April and if possible would like to do it afterwards.  I did offer him the ability to do before but we have settled on May 15.  He reports no chest pain or shortness of breath.  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  He reports that he is had no palpitations either.  Review of labs shows recent LDL of 152.  He has been on no medications.  I did report that it would probably be best to start Crestor.  He is okay to do that today.  He reports no major changes in medications or functional status since last seen.  Problem List 1. Moderate to severe mitral regurgitation, PMVL prolapse  04/2019 2. HLD  Past Medical History: Past Medical History:  Diagnosis Date  . Allergy   . Cataract   . Chronic kidney disease    [id number]  . Hyperlipidemia   . Insomnia     Past Surgical History: Past Surgical History:  Procedure Laterality Date  . FOOT SURGERY     right  . SHOULDER ARTHROSCOPY    . thumb surgery     right  . TONSILLECTOMY    . VASECTOMY      Current Medications: Current Meds  Medication Sig  . Ascorbic Acid (VITAMIN C) 1000 MG tablet Take 1,000 mg by mouth daily.    . clonazePAM (KLONOPIN) 1  MG tablet TAKE 1 TABLET BY MOUTH EVERYDAY AT BEDTIME  . Multiple Vitamin (MULTIVITAMIN) tablet Take 1 tablet by mouth daily.  . Saw Palmetto, Serenoa repens, (SAW PALMETTO PO) Take 1 tablet by mouth daily.    . traZODone (DESYREL) 100 MG tablet Take one pill at night for sleep     Allergies:    Poison ivy treatments [poison ivy treatments] and Pollen extract   Social History: Social History   Socioeconomic History  . Marital status: Married    Spouse name: Not on file  . Number of children: 4  . Years of education: Not on file  . Highest education level: Not on file  Occupational History  . Occupation: retired  Tobacco Use  . Smoking status: Never Smoker  . Smokeless tobacco: Never Used  Substance and Sexual Activity  . Alcohol use: Yes    Alcohol/week: 3.0 standard drinks    Types: 3 Cans of beer per week  . Drug use: No  . Sexual activity: Yes    Birth control/protection: Other-see comments    Comment: vasectomy  Other Topics Concern  . Not on file  Social History Narrative   ** Merged History Encounter **  Social Determinants of Health   Financial Resource Strain:   . Difficulty of Paying Living Expenses:   Food Insecurity:   . Worried About Charity fundraiser in the Last Year:   . Arboriculturist in the Last Year:   Transportation Needs:   . Film/video editor (Medical):   Marland Kitchen Lack of Transportation (Non-Medical):   Physical Activity:   . Days of Exercise per Week:   . Minutes of Exercise per Session:   Stress:   . Feeling of Stress :   Social Connections:   . Frequency of Communication with Friends and Family:   . Frequency of Social Gatherings with Friends and Family:   . Attends Religious Services:   . Active Member of Clubs or Organizations:   . Attends Archivist Meetings:   Marland Kitchen Marital Status:      Family History: The patient's family history includes Arthritis in his maternal grandfather and maternal grandmother; Cancer in his  brother, father, and mother; Heart attack in his maternal grandfather; Heart disease in his father and maternal grandfather. There is no history of Colon cancer, Esophageal cancer, or Stomach cancer.  ROS:   All other ROS reviewed and negative. Pertinent positives noted in the HPI.     EKGs/Labs/Other Studies Reviewed:   The following studies were personally reviewed by me today:  Recent Labs: 04/08/2019: ALT 29; BUN 15; Creatinine, Ser 0.89; Hemoglobin 16.1; Platelets 207.0; Potassium 4.1; Sodium 139; TSH 1.84   Recent Lipid Panel    Component Value Date/Time   CHOL 240 (H) 04/08/2019 1055   TRIG 146.0 04/08/2019 1055   HDL 58.50 04/08/2019 1055   CHOLHDL 4 04/08/2019 1055   VLDL 29.2 04/08/2019 1055   LDLCALC 152 (H) 04/08/2019 1055    Physical Exam:   VS:  BP 122/68   Pulse 87   Temp (!) 96.9 F (36.1 C) (Temporal)   Resp 15   Ht 6' (1.829 m)   Wt 252 lb 12.8 oz (114.7 kg)   SpO2 96%   BMI 34.29 kg/m    Wt Readings from Last 3 Encounters:  07/23/19 252 lb 12.8 oz (114.7 kg)  05/11/19 248 lb (112.5 kg)  04/08/19 252 lb (114.3 kg)    General: Well nourished, well developed, in no acute distress Heart: Atraumatic, normal size  Eyes: PEERLA, EOMI  Neck: Supple, no JVD Endocrine: No thryomegaly Cardiac: 3 out of 6 holosystolic murmur that radiates into the right upper chest, consistent with posterior mitral valve leaflet pathology Lungs: Clear to auscultation bilaterally, no wheezing, rhonchi or rales  Abd: Soft, nontender, no hepatomegaly  Ext: No edema, pulses 2+ Musculoskeletal: No deformities, BUE and BLE strength normal and equal Skin: Warm and dry, no rashes   Neuro: Alert and oriented to person, place, time, and situation, CNII-XII grossly intact, no focal deficits  Psych: Normal mood and affect   ASSESSMENT:   Oscar [name] is a 64 y.o. male who presents for the following: 1. Nonrheumatic mitral valve regurgitation   2. Mixed hyperlipidemia   3.  Pre-procedure lab exam     PLAN:   1. Nonrheumatic mitral valve regurgitation -Harsh 3-6 holosystolic murmur consistent with mitral vegetation.  Most recent echocardiogram consistent with moderate to severe mitral vegetation.  No significant left atrial dilation or left ventricular dilation.  His murmur is suggestive of severe MR.  There is clear radiation into the right chest which is consistent with flow into the right upper pulmonary veins.  I do have concerns this is severe.  He is not symptomatic from this.  We will proceed with a TEE to define the pathology.  He is a low risk surgical candidate if we do find severe pathology he may be a good candidate for early repair.  We will have to see what the TEE shows.  I did offer him the chance to do a TEE earlier than that but he reports he will go to the [place].  He will let us know if he would like to do earlier.  He will discuss this with his wife. -He was given strict return precautions including shortness of breath chest pain or lower extremity edema.  He is asymptomatic from his MR we just need to better characterize it with TEE.  2. Mixed hyperlipidemia -Most recent LDL cholesterol 152.  We will start Crestor 20 mg daily.  Recheck lipid profile when I see him back in 3 months.  3. Pre-procedure lab exam -We will go ahead obtain a BMP for his transesophageal echocardiogram.  Disposition: No follow-ups on file.  Medication Adjustments/Labs and Tests Ordered: Current medicines are reviewed at length with the patient today.  Concerns regarding medicines are outlined above.  Orders Placed This Encounter  Procedures  . Basic metabolic panel  . CBC   Meds ordered this encounter  Medications  . rosuvastatin (CRESTOR) 20 MG tablet    Sig: Take 1 tablet (20 mg total) by mouth daily.    Dispense:  90 tablet    Refill:  3    Patient Instructions  Medication Instructions:  START rosuvastatin (Crestor) 20 mg daily  *If you need a refill  on your cardiac medications before your next appointment, please call your pharmacy*   Lab Work: Today (BMET, CBC)  FASTING Labs 1 week before next office visit with Dr. Audie Box (Lipid)  If you have labs (blood work) drawn today and your tests are completely normal, you will receive your results only by: Marland Kitchen MyChart Message (if you have MyChart) OR . A paper copy in the mail If you have any lab test that is abnormal or we need to change your treatment, we will call you to review the results.   Testing/Procedures: Your physician has requested that you have a TEE May 12th. During a TEE, sound waves are used to create images of your heart. It provides your doctor with information about the size and shape of your heart and how well your heart's chambers and valves are working. In this test, a transducer is attached to the end of a flexible tube that's guided down your throat and into your esophagus (the tube leading from you mouth to your stomach) to get a more detailed image of your heart. You are not awake for the procedure. Please see the instruction sheet given to you today. For further information please visit [email or web address].  --COVID Test 3 days prior at [facility] Saturday May 8th at 10:30 AM   Follow-Up: At [facility], you and your health needs are our priority.  As part of our continuing mission to provide you with exceptional heart care, we have created designated Provider Care Teams.  These Care Teams include your primary Cardiologist (physician) and Advanced Practice Providers (APPs -  Physician Assistants and Nurse Practitioners) who all work together to provide you with the care you need, when you need it.  We recommend signing up for the patient portal called "MyChart".  Sign up information is provided on this After  Visit Summary.  MyChart is used to connect with patients for Virtual Visits (Telemedicine).  Patients are able to view lab/test results, encounter notes,  upcoming appointments, etc.  Non-urgent messages can be sent to your provider as well.   To learn more about what you can do with MyChart, go to [email or web address].    Your next appointment:   3 month(s)  The format for your next appointment:   In Person  Provider:   Eleonore Chiquito, MD       Time Spent with Patient: I have spent a total of 35 minutes with patient reviewing hospital notes, telemetry, EKGs, labs and examining the patient as well as establishing an assessment and plan that was discussed with the patient.  > 50% of time was spent in direct patient care.  Signed, Addison Naegeli. Audie Box, [place]  [address] [place] [address] [place], [place] [address] [phone]  07/23/2019 9:51 AM

## 2019-07-22 ENCOUNTER — Telehealth: Payer: Self-pay | Admitting: Family Medicine

## 2019-07-22 NOTE — Telephone Encounter (Signed)
Patient called in and stated he wanted to send his Prescriptions sent [facility] since it will be easier and cheaper for him. [phone]. Patient said if there were any questions just to give him a call.

## 2019-07-22 NOTE — Telephone Encounter (Signed)
Pharmacy changed to [facility].

## 2019-07-23 ENCOUNTER — Encounter: Payer: Self-pay | Admitting: Cardiovascular Disease

## 2019-07-23 ENCOUNTER — Other Ambulatory Visit: Payer: Self-pay

## 2019-07-23 ENCOUNTER — Ambulatory Visit (INDEPENDENT_AMBULATORY_CARE_PROVIDER_SITE_OTHER): Payer: Medicare Other | Admitting: Cardiovascular Disease

## 2019-07-23 VITALS — BP 122/68 | HR 87 | Temp 96.9°F | Resp 15 | Ht 72.0 in | Wt 252.8 lb

## 2019-07-23 DIAGNOSIS — Z01812 Encounter for preprocedural laboratory examination: Secondary | ICD-10-CM | POA: Diagnosis not present

## 2019-07-23 DIAGNOSIS — E782 Mixed hyperlipidemia: Secondary | ICD-10-CM | POA: Diagnosis not present

## 2019-07-23 DIAGNOSIS — I340 Nonrheumatic mitral (valve) insufficiency: Secondary | ICD-10-CM | POA: Diagnosis not present

## 2019-07-23 LAB — BASIC METABOLIC PANEL
BUN/Creatinine Ratio: 15 (ref 10–24)
BUN: 14 mg/dL (ref 8–27)
CO2: 24 mmol/L (ref 20–29)
Calcium: 9.3 mg/dL (ref 8.6–10.2)
Chloride: 101 mmol/L (ref 96–106)
Creatinine, Ser: 0.91 mg/dL (ref 0.76–1.27)
GFR calc Af Amer: 103 mL/min/{1.73_m2} (ref >59)
GFR calc non Af Amer: 89 mL/min/{1.73_m2} (ref >59)
Glucose: 103 mg/dL — ABNORMAL HIGH (ref 65–99)
Potassium: 4.5 mmol/L (ref 3.5–5.2)
Sodium: 139 mmol/L (ref 134–144)

## 2019-07-23 LAB — CBC
Hematocrit: 47.7 % (ref 37.5–51.0)
Hemoglobin: 16.8 g/dL (ref 13.0–17.7)
MCH: 34.6 pg — ABNORMAL HIGH (ref 26.6–33.0)
MCHC: 35.2 g/dL (ref 31.5–35.7)
MCV: 98 fL — ABNORMAL HIGH (ref 79–97)
Platelets: 199 10*3/uL (ref 150–450)
RBC: 4.85 x10E6/uL (ref 4.14–5.80)
RDW: 12 % (ref 11.6–15.4)
WBC: 6.2 10*3/uL (ref 3.4–10.8)

## 2019-07-23 MED ORDER — ROSUVASTATIN CALCIUM 20 MG PO TABS: 20.0000 mg | ORAL_TABLET | Freq: Every day | ORAL | 3 refills | Status: DC

## 2019-07-23 NOTE — Patient Instructions (Signed)
Medication Instructions:  START rosuvastatin (Crestor) 20 mg daily  *If you need a refill on your cardiac medications before your next appointment, please call your pharmacy*   Lab Work: Today (BMET, CBC)  FASTING Labs 1 week before next office visit with Dr. Audie Box (Lipid)  If you have labs (blood work) drawn today and your tests are completely normal, you will receive your results only by: Marland Kitchen MyChart Message (if you have MyChart) OR . A paper copy in the mail If you have any lab test that is abnormal or we need to change your treatment, we will call you to review the results.   Testing/Procedures: Your physician has requested that you have a TEE May 12th. During a TEE, sound waves are used to create images of your heart. It provides your doctor with information about the size and shape of your heart and how well your heart's chambers and valves are working. In this test, a transducer is attached to the end of a flexible tube that's guided down your throat and into your esophagus (the tube leading from you mouth to your stomach) to get a more detailed image of your heart. You are not awake for the procedure. Please see the instruction sheet given to you today. For further information please visit [email or web address].  --COVID Test 3 days prior at [facility] Saturday May 8th at 10:30 AM   Follow-Up: At [facility], you and your health needs are our priority.  As part of our continuing mission to provide you with exceptional heart care, we have created designated Provider Care Teams.  These Care Teams include your primary Cardiologist (physician) and Advanced Practice Providers (APPs -  Physician Assistants and Nurse Practitioners) who all work together to provide you with the care you need, when you need it.  We recommend signing up for the patient portal called "MyChart".  Sign up information is provided on this After Visit Summary.  MyChart is used to connect with patients for  Virtual Visits (Telemedicine).  Patients are able to view lab/test results, encounter notes, upcoming appointments, etc.  Non-urgent messages can be sent to your provider as well.   To learn more about what you can do with MyChart, go to [email or web address].    Your next appointment:   3 month(s)  The format for your next appointment:   In Person  Provider:   Eleonore Chiquito, MD

## 2019-07-25 ENCOUNTER — Encounter: Payer: Self-pay | Admitting: Family Medicine

## 2019-07-27 ENCOUNTER — Other Ambulatory Visit: Payer: Self-pay

## 2019-07-27 MED ORDER — CLONAZEPAM 1 MG PO TABS: ORAL_TABLET | ORAL | 0 refills | Status: DC

## 2019-08-13 ENCOUNTER — Ambulatory Visit: Payer: BC Managed Care – PPO | Admitting: Cardiovascular Disease

## 2019-09-05 DIAGNOSIS — I509 Heart failure, unspecified: Secondary | ICD-10-CM

## 2019-09-05 HISTORY — DX: Heart failure, unspecified: I50.9

## 2019-09-12 ENCOUNTER — Other Ambulatory Visit (HOSPITAL_COMMUNITY)
Admission: RE | Admit: 2019-09-12 | Discharge: 2019-09-12 | Disposition: A | Discharge status: Home / Self Care | Payer: Medicare Other | Source: Ambulatory Visit | Attending: Cardiovascular Disease | Admitting: Cardiovascular Disease

## 2019-09-12 DIAGNOSIS — Z20822 Contact with and (suspected) exposure to covid-19: Secondary | ICD-10-CM | POA: Insufficient documentation

## 2019-09-12 DIAGNOSIS — Z01812 Encounter for preprocedural laboratory examination: Secondary | ICD-10-CM | POA: Insufficient documentation

## 2019-09-12 LAB — SARS CORONAVIRUS 2 (TAT 6-24 HRS): SARS Coronavirus 2: NEGATIVE

## 2019-09-15 NOTE — Anesthesia Preprocedure Evaluation (Addendum)
Anesthesia Evaluation  Patient identified by MRN, date of birth, ID band Patient awake    Reviewed: Allergy & Precautions, NPO status , Patient's Chart, lab work & pertinent test results  Airway Mallampati: II  TM Distance: >3 FB Neck ROM: Full    Dental no notable dental hx. (+) Teeth Intact, Dental Advisory Given   Pulmonary neg pulmonary ROS,    Pulmonary exam normal breath sounds clear to auscultation       Cardiovascular Normal cardiovascular exam+ Valvular Problems/Murmurs MR  Rhythm:Regular Rate:Normal  04/10/19 Echo The mitral valve is abnormal. Prolapse of the posterior leaflet. There is eccentric anterior directed jet. Appears to be moderate mitral regurgitation, though could be underestimating given eccentric jet. Normal left atrial size and E wave <1.2 argues against severe MR, though does have mild LV dilatation of unclear cause. Comparing MV annular inflow to LVOT SV, the regurgitant volume is 50cc (RF 34%), suggestive of moderate MR. If clinical suspicion for severe MR, consider cardiac MRI or TEE for further evaluation. 2. Left ventricular ejection fraction, by visual estimation, is 60 to 65%. The left ventricle has normal function. There is no left ventricular hypertrophy   Neuro/Psych negative neurological ROS  negative psych ROS   GI/Hepatic negative GI ROS, Neg liver ROS,   Endo/Other  negative endocrine ROS  Renal/GU CRFRenal disease  negative genitourinary   Musculoskeletal negative musculoskeletal ROS (+)   Abdominal   Peds  Hematology negative hematology ROS (+)   Anesthesia Other Findings   Reproductive/Obstetrics                            Anesthesia Physical Anesthesia Plan  ASA: II  Anesthesia Plan: MAC   Post-op Pain Management:    Induction: Intravenous  PONV Risk Score and Plan: Treatment may vary due to age or medical condition  Airway Management  Planned: Nasal Cannula and Natural Airway  Additional Equipment: None  Intra-op Plan:   Post-operative Plan:   Informed Consent: I have reviewed the patients History and Physical, chart, labs and discussed the procedure including the risks, benefits and alternatives for the proposed anesthesia with the patient or authorized representative who has indicated his/her understanding and acceptance.     Dental advisory given  Plan Discussed with: CRNA  Anesthesia Plan Comments:        Anesthesia Quick Evaluation

## 2019-09-16 ENCOUNTER — Encounter (HOSPITAL_COMMUNITY)
Admission: RE | Disposition: A | Discharge status: Home / Self Care | Payer: Self-pay | Source: Home / Self Care | Attending: Cardiovascular Disease

## 2019-09-16 ENCOUNTER — Ambulatory Visit (HOSPITAL_COMMUNITY): Payer: Medicare Other | Admitting: Anesthesiology

## 2019-09-16 ENCOUNTER — Other Ambulatory Visit: Payer: Self-pay

## 2019-09-16 ENCOUNTER — Ambulatory Visit (HOSPITAL_BASED_OUTPATIENT_CLINIC_OR_DEPARTMENT_OTHER): Payer: Medicare Other

## 2019-09-16 ENCOUNTER — Ambulatory Visit (HOSPITAL_COMMUNITY)
Admission: RE | Admit: 2019-09-16 | Discharge: 2019-09-16 | Disposition: A | Discharge status: Home / Self Care | Payer: Medicare Other | Attending: Cardiovascular Disease | Admitting: Cardiovascular Disease

## 2019-09-16 ENCOUNTER — Encounter (HOSPITAL_COMMUNITY): Payer: Self-pay | Admitting: Cardiovascular Disease

## 2019-09-16 DIAGNOSIS — H269 Unspecified cataract: Secondary | ICD-10-CM | POA: Diagnosis not present

## 2019-09-16 DIAGNOSIS — N189 Chronic kidney disease, unspecified: Secondary | ICD-10-CM | POA: Insufficient documentation

## 2019-09-16 DIAGNOSIS — I341 Nonrheumatic mitral (valve) prolapse: Secondary | ICD-10-CM | POA: Insufficient documentation

## 2019-09-16 DIAGNOSIS — I420 Dilated cardiomyopathy: Secondary | ICD-10-CM | POA: Insufficient documentation

## 2019-09-16 DIAGNOSIS — Z9852 Vasectomy status: Secondary | ICD-10-CM | POA: Diagnosis not present

## 2019-09-16 DIAGNOSIS — I700 Atherosclerosis of aorta: Secondary | ICD-10-CM | POA: Diagnosis not present

## 2019-09-16 DIAGNOSIS — Z79899 Other long term (current) drug therapy: Secondary | ICD-10-CM | POA: Diagnosis not present

## 2019-09-16 DIAGNOSIS — Z8249 Family history of ischemic heart disease and other diseases of the circulatory system: Secondary | ICD-10-CM | POA: Diagnosis not present

## 2019-09-16 DIAGNOSIS — I340 Nonrheumatic mitral (valve) insufficiency: Secondary | ICD-10-CM | POA: Diagnosis not present

## 2019-09-16 DIAGNOSIS — E785 Hyperlipidemia, unspecified: Secondary | ICD-10-CM | POA: Diagnosis not present

## 2019-09-16 DIAGNOSIS — G4700 Insomnia, unspecified: Secondary | ICD-10-CM | POA: Diagnosis not present

## 2019-09-16 HISTORY — DX: Nonrheumatic mitral (valve) insufficiency: I34.0

## 2019-09-16 HISTORY — PX: TEE WITHOUT CARDIOVERSION: SHX5443

## 2019-09-16 HISTORY — PX: BUBBLE STUDY: SHX6837

## 2019-09-16 LAB — POCT I-STAT, CHEM 8
BUN: 14 mg/dL (ref 8–23)
Calcium, Ion: 1.16 mmol/L (ref 1.15–1.40)
Chloride: 104 mmol/L (ref 98–111)
Creatinine, Ser: 0.9 mg/dL (ref 0.61–1.24)
Glucose, Bld: 105 mg/dL — ABNORMAL HIGH (ref 70–99)
HCT: 46 % (ref 39.0–52.0)
Hemoglobin: 15.6 g/dL (ref 13.0–17.0)
Potassium: 3.9 mmol/L (ref 3.5–5.1)
Sodium: 141 mmol/L (ref 135–145)
TCO2: 31 mmol/L (ref 22–32)

## 2019-09-16 SURGERY — ECHOCARDIOGRAM, TRANSESOPHAGEAL
Anesthesia: Monitor Anesthesia Care

## 2019-09-16 MED ORDER — PROPOFOL 500 MG/50ML IV EMUL
INTRAVENOUS | Status: DC | PRN
  Administered 2019-09-16: 175 ug/kg/min via INTRAVENOUS
  Administered 2019-09-16: 75 ug/kg/min via INTRAVENOUS

## 2019-09-16 MED ORDER — LACTATED RINGERS IV SOLN: INTRAVENOUS | Status: DC

## 2019-09-16 MED ORDER — ONDANSETRON HCL 4 MG/2ML IJ SOLN
INTRAMUSCULAR | Status: DC | PRN
  Administered 2019-09-16: 4 mg via INTRAVENOUS

## 2019-09-16 MED ORDER — PROPOFOL 10 MG/ML IV BOLUS
INTRAVENOUS | Status: DC | PRN
  Administered 2019-09-16: 15 mg via INTRAVENOUS
  Administered 2019-09-16 (×2): 25 mg via INTRAVENOUS

## 2019-09-16 MED ORDER — BUTAMBEN-TETRACAINE-BENZOCAINE 2-2-14 % EX AERO
INHALATION_SPRAY | CUTANEOUS | Status: DC | PRN
  Administered 2019-09-16: 2 via TOPICAL

## 2019-09-16 MED ORDER — LIDOCAINE 2% (20 MG/ML) 5 ML SYRINGE
INTRAMUSCULAR | Status: DC | PRN
  Administered 2019-09-16: 40 mg via INTRAVENOUS

## 2019-09-16 MED ORDER — SODIUM CHLORIDE 0.9 % IV SOLN: INTRAVENOUS | Status: DC

## 2019-09-16 NOTE — Progress Notes (Signed)
  Echocardiogram Echocardiogram Transesophageal has been performed.  Johny Chess 09/16/2019, 9:56 AM

## 2019-09-16 NOTE — Discharge Instructions (Signed)

## 2019-09-16 NOTE — Anesthesia Postprocedure Evaluation (Signed)
Anesthesia Post Note  Patient: Oscar [name]  Procedure(s) Performed: TRANSESOPHAGEAL ECHOCARDIOGRAM (TEE) (N/A ) BUBBLE STUDY     Patient location during evaluation: Endoscopy Anesthesia Type: MAC Level of consciousness: awake and alert Pain management: pain level controlled Vital Signs Assessment: post-procedure vital signs reviewed and stable Respiratory status: spontaneous breathing, nonlabored ventilation, respiratory function stable and patient connected to nasal cannula oxygen Cardiovascular status: blood pressure returned to baseline and stable Postop Assessment: no apparent nausea or vomiting Anesthetic complications: no    Last Vitals:  Vitals:   09/16/19 0955 09/16/19 1000  BP:  140/83  Pulse: 76 69  Resp: 18 10  Temp:    SpO2: 97% 96%    Last Pain:  Vitals:   09/16/19 1000  TempSrc:   PainSc: 0-No pain                 Barnet Glasgow

## 2019-09-16 NOTE — Addendum Note (Signed)
Addendum  created 09/16/19 1318 by Barnet Glasgow, MD   Clinical Note Signed

## 2019-09-16 NOTE — H&P (Signed)
Cardiology Pre-Procedure History and Physical:  Patient ID: Oscar [name] MRN: [id number] DOB: 1955/05/14  Admit date: 09/16/2019  Primary Care Provider: Orma Flaming, MD Primary Cardiologist: Oscar Field, MD   Chief Complaint:  Mitral regurgitation  Patient Profile:  Oscar [name] is a 64 y.o. male with Oscar [name] is a 64 y.o. male with a hx of mitral regurgitation, HLD who presents for TEE to evaluation MR.   History of Present Illness:  Oscar [name] was noted to have posterior mitral valve leaflet prolapse with at least moderate mitral regurgitation on her transthoracic echocardiogram in January.  He is without symptoms but likely has severe mitral regurgitation due to significant splay artifact seen.  He is here today for transesophageal echocardiogram to further evaluate and characterize the degree of mitral regurgitation.  He denies symptoms of chest pain, shortness of breath or palpitations today.  He remains quite active and has had no major issues.  He recently went on a trip to the [place] and had no issues snorkeling.  Heart Pathway Score:       Past Medical History: Past Medical History:  Diagnosis Date  . Allergy   . Cataract   . Chronic kidney disease    [id number]  . Hyperlipidemia   . Insomnia   . Mitral regurgitation     Past Surgical History: Past Surgical History:  Procedure Laterality Date  . FOOT SURGERY     right  . SHOULDER ARTHROSCOPY    . thumb surgery     right  . TONSILLECTOMY    . VASECTOMY       Medications Prior to Admission: Prior to Admission medications   Medication Sig Start Date End Date Taking? Authorizing Provider  Ascorbic Acid (VITAMIN C) 1000 MG tablet Take 1,000 mg by mouth daily.     Yes [provider]  clonazePAM (KLONOPIN) 1 MG tablet TAKE 1 TABLET BY MOUTH EVERYDAY AT BEDTIME Patient taking differently: Take 1 mg by mouth at bedtime.  07/27/19  Yes Oscar Flaming, MD  Multiple  Vitamin (MULTIVITAMIN) tablet Take 1 tablet by mouth daily.   Yes [provider]  rosuvastatin (CRESTOR) 20 MG tablet Take 1 tablet (20 mg total) by mouth daily. 07/23/19 10/21/19 Yes [name], Oscar Freer, MD  Saw Palmetto, Serenoa repens, (SAW PALMETTO PO) Take 1 tablet by mouth daily.     Yes [provider]  traZODone (DESYREL) 100 MG tablet Take one pill at night for sleep Patient taking differently: Take 100 mg by mouth at bedtime.  07/10/19  Yes Oscar Flaming, MD  fluticasone [facility]) 50 MCG/ACT nasal spray Place 2 sprays into the nose 2 (two) times daily. Patient not taking: Reported on 09/04/2019 02/26/12   Oscar Koyanagi, MD     Allergies:    Allergies  Allergen Reactions  . Poison Ivy Treatments [Poison Ivy Treatments]     Allergic to poison ivy  . Pollen Extract     Social History:   Social History   Socioeconomic History  . Marital status: Married    Spouse name: Not on file  . Number of children: 4  . Years of education: Not on file  . Highest education level: Not on file  Occupational History  . Occupation: retired  Tobacco Use  . Smoking status: Never Smoker  . Smokeless tobacco: Never Used  Substance and Sexual Activity  . Alcohol use: Yes    Alcohol/week: 3.0 standard drinks    Types: 3 Cans of  beer per week  . Drug use: No  . Sexual activity: Yes    Birth control/protection: Other-see comments    Comment: vasectomy  Other Topics Concern  . Not on file  Social History Narrative   ** Merged History Encounter **       Social Determinants of Health   Financial Resource Strain:   . Difficulty of Paying Living Expenses:   Food Insecurity:   . Worried About Charity fundraiser in the Last Year:   . Arboriculturist in the Last Year:   Transportation Needs:   . Film/video editor (Medical):   Marland Kitchen Lack of Transportation (Non-Medical):   Physical Activity:   . Days of Exercise per Week:   . Minutes of Exercise per Session:     Stress:   . Feeling of Stress :   Social Connections:   . Frequency of Communication with Friends and Family:   . Frequency of Social Gatherings with Friends and Family:   . Attends Religious Services:   . Active Member of Clubs or Organizations:   . Attends Archivist Meetings:   Marland Kitchen Marital Status:   Intimate Partner Violence:   . Fear of Current or Ex-Partner:   . Emotionally Abused:   Marland Kitchen Physically Abused:   . Sexually Abused:      Family History:   The patient's family history includes Arthritis in his maternal grandfather and maternal grandmother; Cancer in his brother, father, and mother; Heart attack in his maternal grandfather; Heart disease in his father and maternal grandfather. There is no history of Colon cancer, Esophageal cancer, or Stomach cancer.    ROS:  All other ROS reviewed and negative. Pertinent positives noted in the HPI.     Physical Exam/Data:   Vitals:   09/16/19 0800  BP: (!) 153/79  Pulse: 77  Resp: 12  Temp: 97.9 F (36.6 C)  TempSrc: Oral  SpO2: 94%  Weight: 112 kg  Height: 6\' 2"  (1.88 m)   No intake or output data in the 24 hours ending 09/16/19 0836  Last 3 Weights 09/16/2019 07/23/2019 05/11/2019  Weight (lbs) 247 lb 252 lb 12.8 oz 248 lb  Weight (kg) 112.038 kg 114.669 kg 112.492 kg    Body mass index is 31.71 kg/m.   General: Well nourished, well developed, in no acute distress Heart: Atraumatic, normal size  Eyes: PEERLA, EOMI  Neck: Supple, no JVD Endocrine: No thryomegaly Cardiac: Normal S1, S2; 3/6 HSM Lungs: Clear to auscultation bilaterally, no wheezing, rhonchi or rales  Abd: Soft, nontender, no hepatomegaly  Ext: No edema, pulses 2+ Musculoskeletal: No deformities, BUE and BLE strength normal and equal Skin: Warm and dry, no rashes   Neuro: Alert and oriented to person, place, time, and situation, CNII-XII grossly intact, no focal deficits  Psych: Normal mood and affect   Laboratory Data: High Sensitivity  Troponin:  No results for input(s): TROPONINIHS in the last 720 hours.    Cardiac EnzymesNo results for input(s): TROPONINI in the last 168 hours. No results for input(s): TROPIPOC in the last 168 hours.  ChemistryNo results for input(s): NA, K, CL, CO2, GLUCOSE, BUN, CREATININE, CALCIUM, GFRNONAA, GFRAA, ANIONGAP in the last 168 hours.  No results for input(s): PROT, ALBUMIN, AST, ALT, ALKPHOS, BILITOT in the last 168 hours. HematologyNo results for input(s): WBC, RBC, HGB, HCT, MCV, MCH, MCHC, RDW, PLT in the last 168 hours. BNPNo results for input(s): BNP, PROBNP in the last 168 hours.  DDimer  No results for input(s): DDIMER in the last 168 hours.  Radiology/Studies:  No results found.  Assessment and Plan:  1. Mitral regurgitation: We will proceed with transesophageal echocardiogram to further characterize his mitral regurgitation.  We will use deep sedation with anesthesia.  There are no significant allergies reported.  No significant comorbidities.  He has no difficulty swallowing and no issues with bleeding in the gastrointestinal system.  We will also use lidocaine to numb the oropharynx.  We will plan for discharge home later this morning after his procedure.  For questions or updates, please contact [place] Please consult [email or web address] for contact info under    Signed, [place] Bells T. Audie Box, [place]  09/16/2019 8:36 AM

## 2019-09-16 NOTE — Anesthesia Procedure Notes (Signed)
Procedure Name: MAC Date/Time: 09/16/2019 9:00 AM Performed by: Janace Litten, CRNA Pre-anesthesia Checklist: Patient identified, Emergency Drugs available, Suction available and Patient being monitored Patient Re-evaluated:Patient Re-evaluated prior to induction Oxygen Delivery Method: Nasal cannula

## 2019-09-16 NOTE — CV Procedure (Signed)
    TRANSESOPHAGEAL ECHOCARDIOGRAM   NAME:  Oscar [name]    MRN: [id number] DOB:  02/02/1956    ADMIT DATE: 09/16/2019  INDICATIONS: Severe mitral regurgitation   PROCEDURE:   Informed consent was obtained prior to the procedure. The risks, benefits and alternatives for the procedure were discussed and the patient comprehended these risks.  Risks include, but are not limited to, cough, sore throat, vomiting, nausea, somnolence, esophageal and stomach trauma or perforation, bleeding, low blood pressure, aspiration, pneumonia, infection, trauma to the teeth and death.    Procedural time out performed. The oropharynx was anesthetized with topical 1% benzocaine.    Anesthesia was administered by Dr. Esperanza Richters.  The patient's heart rate, blood pressure, and oxygen saturation are monitored continuously during the procedure. The period of conscious sedation is 35 minutes, of which I was present face-to-face 100% of this time.   The transesophageal probe was inserted in the esophagus and stomach without difficulty and multiple views were obtained.   COMPLICATIONS:    There were no immediate complications.  KEY FINDINGS: 1. Severe MR.  2. Flail P2 segment.  3. EF 70%.   Full report to follow.   [place] Bells T. Audie Box, [place]  [address], [place], [place] [address] [phone]  9:47 AM

## 2019-09-16 NOTE — Transfer of Care (Signed)
Immediate Anesthesia Transfer of Care Note  Patient: Oscar [name]  Procedure(s) Performed: TRANSESOPHAGEAL ECHOCARDIOGRAM (TEE) (N/A ) BUBBLE STUDY  Patient Location: PACU and Endoscopy Unit  Anesthesia Type:MAC  Level of Consciousness: drowsy and responds to stimulation  Airway & Oxygen Therapy: Patient Spontanous Breathing and Patient connected to nasal cannula oxygen  Post-op Assessment: Report given to RN and Post -op Vital signs reviewed and stable  Post vital signs: Reviewed and stable  Last Vitals:  Vitals Value Taken Time  BP    Temp    Pulse 81 09/16/19 0938  Resp 18 09/16/19 0938  SpO2 88 % 09/16/19 0938  Vitals shown include unvalidated device data.  Last Pain:  Vitals:   09/16/19 0800  TempSrc: Oral  PainSc: 0-No pain         Complications: No apparent anesthesia complications

## 2019-09-16 NOTE — Anesthesia Postprocedure Evaluation (Signed)
Anesthesia Post Note  Patient: Oscar [name]  Procedure(s) Performed: TRANSESOPHAGEAL ECHOCARDIOGRAM (TEE) (N/A ) BUBBLE STUDY     Patient location during evaluation: Endoscopy Anesthesia Type: MAC Level of consciousness: awake and alert Pain management: pain level controlled Vital Signs Assessment: post-procedure vital signs reviewed and stable Respiratory status: spontaneous breathing, nonlabored ventilation, respiratory function stable and patient connected to nasal cannula oxygen Cardiovascular status: blood pressure returned to baseline and stable Postop Assessment: no apparent nausea or vomiting Anesthetic complications: no    Last Vitals:  Vitals:   09/16/19 0955 09/16/19 1000  BP:  140/83  Pulse: 76 69  Resp: 18 10  Temp:    SpO2: 97% 96%    Last Pain:  Vitals:   09/16/19 1000  TempSrc:   PainSc: 0-No pain                 Maggie Senseney A Kelsie Kramp     

## 2019-09-17 ENCOUNTER — Encounter: Payer: Self-pay | Admitting: Cardiovascular Disease

## 2019-09-17 ENCOUNTER — Ambulatory Visit: Payer: Medicare Other | Admitting: Cardiovascular Disease

## 2019-09-17 VITALS — BP 128/76 | HR 92 | Ht 74.0 in | Wt 251.0 lb

## 2019-09-17 DIAGNOSIS — E782 Mixed hyperlipidemia: Secondary | ICD-10-CM

## 2019-09-17 DIAGNOSIS — I340 Nonrheumatic mitral (valve) insufficiency: Secondary | ICD-10-CM

## 2019-09-17 LAB — BASIC METABOLIC PANEL
BUN/Creatinine Ratio: 17 (ref 10–24)
BUN: 16 mg/dL (ref 8–27)
CO2: 23 mmol/L (ref 20–29)
Calcium: 9.7 mg/dL (ref 8.6–10.2)
Chloride: 101 mmol/L (ref 96–106)
Creatinine, Ser: 0.95 mg/dL (ref 0.76–1.27)
GFR calc Af Amer: 97 mL/min/{1.73_m2} (ref >59)
GFR calc non Af Amer: 84 mL/min/{1.73_m2} (ref >59)
Glucose: 94 mg/dL (ref 65–99)
Potassium: 4.4 mmol/L (ref 3.5–5.2)
Sodium: 140 mmol/L (ref 134–144)

## 2019-09-17 LAB — CBC
Hematocrit: 45.7 % (ref 37.5–51.0)
Hemoglobin: 15.9 g/dL (ref 13.0–17.7)
MCH: 34.2 pg — ABNORMAL HIGH (ref 26.6–33.0)
MCHC: 34.8 g/dL (ref 31.5–35.7)
MCV: 98 fL — ABNORMAL HIGH (ref 79–97)
Platelets: 216 10*3/uL (ref 150–450)
RBC: 4.65 x10E6/uL (ref 4.14–5.80)
RDW: 12.1 % (ref 11.6–15.4)
WBC: 7.4 10*3/uL (ref 3.4–10.8)

## 2019-09-17 NOTE — H&P (View-Only) (Signed)
Cardiology Office Note:   Date:  09/17/2019  NAME:  Oscar [name]    MRN: [id number] DOB:  12/24/1955   PCP:  Orma Flaming, MD  Cardiologist:  Evalina Field, MD   Referring MD: Orma Flaming, MD   Chief Complaint  Patient presents with  . Mitral Regurgitation   History of Present Illness:   Oscar [name] is a 64 y.o. male with a hx of mitral regurgitation who presents for follow-up.  He underwent a transesophageal echocardiogram yesterday.  This did reveal a flail P2 segment with severe mitral vegetation.  He does report that he possibly is a little more short of breath with some activity.  His wife is present by phone and she does report that he has become a little more short of breath as well.  Either way, given his flail segment he does need mitral valve surgery.  We did not know that he would end up having severe MR or we would have had his left heart catheterization done yesterday.  We will plan to set him up for this early next week.  I did discuss with him that he will need to have this repaired.  I do think he is a good candidate for cardiac surgery and an even better candidate for mitral valve repair given the pathology seen.  We will have him evaluated by Dr. Halford Chessman with [facility] for mitral valve repair.  He denies chest pain, shortness of breath, palpitations today.  1. Severe Mitral Regurgitation  -Flail P2 segment  -EF 65-70%  Past Medical History: Past Medical History:  Diagnosis Date  . Allergy   . Cataract   . Chronic kidney disease    [id number]  . Hyperlipidemia   . Insomnia   . Mitral regurgitation     Past Surgical History: Past Surgical History:  Procedure Laterality Date  . FOOT SURGERY     right  . SHOULDER ARTHROSCOPY    . thumb surgery     right  . TONSILLECTOMY    . VASECTOMY      Current Medications: Current Meds  Medication Sig  . Ascorbic Acid (VITAMIN C) 1000 MG tablet Take 1,000 mg by mouth daily.    .  clonazePAM (KLONOPIN) 1 MG tablet TAKE 1 TABLET BY MOUTH EVERYDAY AT BEDTIME (Patient taking differently: Take 1 mg by mouth at bedtime. )  . fluticasone (FLONASE) 50 MCG/ACT nasal spray Place 2 sprays into the nose 2 (two) times daily.  . Multiple Vitamin (MULTIVITAMIN) tablet Take 1 tablet by mouth daily.  . rosuvastatin (CRESTOR) 20 MG tablet Take 1 tablet (20 mg total) by mouth daily.  . Saw Palmetto, Serenoa repens, (SAW PALMETTO PO) Take 1 tablet by mouth daily.    . traZODone (DESYREL) 100 MG tablet Take one pill at night for sleep (Patient taking differently: Take 100 mg by mouth at bedtime. )     Allergies:    Poison ivy treatments [poison ivy treatments] and Pollen extract   Social History: Social History   Socioeconomic History  . Marital status: Married    Spouse name: Not on file  . Number of children: 4  . Years of education: Not on file  . Highest education level: Not on file  Occupational History  . Occupation: retired  Tobacco Use  . Smoking status: Never Smoker  . Smokeless tobacco: Never Used  Substance and Sexual Activity  . Alcohol use: Yes    Alcohol/week: 3.0 standard drinks  Types: 3 Cans of beer per week  . Drug use: No  . Sexual activity: Yes    Birth control/protection: Other-see comments    Comment: vasectomy  Other Topics Concern  . Not on file  Social History Narrative   ** Merged History Encounter **       Social Determinants of Health   Financial Resource Strain:   . Difficulty of Paying Living Expenses:   Food Insecurity:   . Worried About Charity fundraiser in the Last Year:   . Arboriculturist in the Last Year:   Transportation Needs:   . Film/video editor (Medical):   Marland Kitchen Lack of Transportation (Non-Medical):   Physical Activity:   . Days of Exercise per Week:   . Minutes of Exercise per Session:   Stress:   . Feeling of Stress :   Social Connections:   . Frequency of Communication with Friends and Family:   . Frequency  of Social Gatherings with Friends and Family:   . Attends Religious Services:   . Active Member of Clubs or Organizations:   . Attends Archivist Meetings:   Marland Kitchen Marital Status:      Family History: The patient's family history includes Arthritis in his maternal grandfather and maternal grandmother; Cancer in his brother, father, and mother; Heart attack in his maternal grandfather; Heart disease in his father and maternal grandfather. There is no history of Colon cancer, Esophageal cancer, or Stomach cancer.  ROS:   All other ROS reviewed and negative. Pertinent positives noted in the HPI.     EKGs/Labs/Other Studies Reviewed:   The following studies were personally reviewed by me today:  TEE 09/16/2019 1. There is severe prolapse of the P2 segment with a fail chord. There is  moderate P3 prolapse. There is severe MR with an eccentric jet directed  anteriorly with coanda effect. There is systolic pulmonary vein flow  reversal in the left upper pulmonary  vein. 2D PISA radius is 1.3 cm, 2D ERO 0.74 cm2, R vol 121 cc. 3D ERO 0.75  cm2. Flail width 14 mm. Flail gap 5.2 mm. PMVL length 19 mm. Overall,  findings consistent with severe mitral regurgitation. The mitral valve is  myxomatous. Severe mitral valve  regurgitation. No evidence of mitral stenosis.  2. Left ventricular ejection fraction, by estimation, is 65 to 70%. Left  ventricular ejection fraction by 3D volume is 71 %. The left ventricle has  normal function. The left ventricle has no regional wall motion  abnormalities.  3. Right ventricular systolic function is normal. The right ventricular  size is normal.  4. Left atrial size was mildly dilated. No left atrial/left atrial  appendage thrombus was detected. The LAA emptying velocity was 65 cm/s.  5. There appears to be a flail cord of the septal leflet. There is mild  TR associated with this. The tricuspid valve is myxomatous.  6. The aortic valve is  tricuspid. Aortic valve regurgitation is not  visualized. No aortic stenosis is present.  7. There is mild (Grade II) layered plaque involving the ascending aorta,  transverse aorta and descending aorta.  8. Agitated saline contrast bubble study was negative, with no evidence  of any interatrial shunt.   Recent Labs: 04/08/2019: ALT 29; TSH 1.84 07/23/2019: Platelets 199 09/16/2019: BUN 14; Creatinine, Ser 0.90; Hemoglobin 15.6; Potassium 3.9; Sodium 141   Recent Lipid Panel    Component Value Date/Time   CHOL 240 (H) 04/08/2019 1055  TRIG 146.0 04/08/2019 1055   HDL 58.50 04/08/2019 1055   CHOLHDL 4 04/08/2019 1055   VLDL 29.2 04/08/2019 1055   LDLCALC 152 (H) 04/08/2019 1055    Physical Exam:   VS:  BP 128/76   Pulse 92   Ht 6\' 2"  (1.88 m)   Wt 251 lb (113.9 kg)   SpO2 95%   BMI 32.23 kg/m    Wt Readings from Last 3 Encounters:  09/17/19 251 lb (113.9 kg)  09/16/19 247 lb (112 kg)  07/23/19 252 lb 12.8 oz (114.7 kg)    General: Well nourished, well developed, in no acute distress Heart: Atraumatic, normal size  Eyes: PEERLA, EOMI  Neck: Supple, no JVD Endocrine: No thryomegaly Cardiac: Normal S1, S2; RRR; 3 out of 6 holosystolic murmur Lungs: Clear to auscultation bilaterally, no wheezing, rhonchi or rales  Abd: Soft, nontender, no hepatomegaly  Ext: No edema, pulses 2+ Musculoskeletal: No deformities, BUE and BLE strength normal and equal Skin: Warm and dry, no rashes   Neuro: Alert and oriented to person, place, time, and situation, CNII-XII grossly intact, no focal deficits  Psych: Normal mood and affect   ASSESSMENT:   Oscar [name] is a 64 y.o. male who presents for the following: 1. Nonrheumatic mitral valve regurgitation   2. Mixed hyperlipidemia     PLAN:   1. Nonrheumatic mitral valve regurgitation -Severe mitral vegetation.  He has a flail P2 segment with prolapse of P3.  He does have a flail segment or cord of the tricuspid septal leaflet but  no significant regurgitation.  No other significant valvular heart disease.  He does report some increase shortness of breath.  He therefore does meet criteria for intervention.  We will go ahead and get him set up for left heart catheterization on Monday.  I have also placed a referral to try thoracic surgery for him to be evaluated for repair by Dr. Halford Chessman.  I think you will be a good candidate for mitral valve repair we will await his evaluation by Dr. Ricard Dillon.  I did spend much of our visit explaining the need for mitral valve surgery to Mr. Shiner and his wife.  They are both in agreement and ready to move forward.  2. Mixed hyperlipidemia -Continue Crestor.  We will need to get his cholesterol under better control.  We will further titrate this based on the results of his left heart catheterization.  He reports no angina or symptoms concerning for underlying CAD.   Disposition: Return in about 3 months (around 12/18/2019).  Medication Adjustments/Labs and Tests Ordered: Current medicines are reviewed at length with the patient today.  Concerns regarding medicines are outlined above.  Orders Placed This Encounter  Procedures  . Basic metabolic panel  . CBC  . Ambulatory referral to Cardiothoracic Surgery   No orders of the defined types were placed in this encounter.   Patient Instructions  Medication Instructions:  The current medical regimen is effective;  continue present plan and medications.  *If you need a refill on your cardiac medications before your next appointment, please call your pharmacy*   Lab Work: BMET, CBC today  COVID TESTING- Friday 05/14 at 11:55- [address], [place] [address]  If you have labs (blood work) drawn today and your tests are completely normal, you will receive your results only by: Marland Kitchen MyChart Message (if you have MyChart) OR . A paper copy in the mail If you have any lab test that is  abnormal or we need to change your treatment,  we will call you to review the results.   Testing/Procedures: Your physician has requested that you have a cardiac catheterization. Cardiac catheterization is used to diagnose and/or treat various heart conditions. Doctors may recommend this procedure for a number of different reasons. The most common reason is to evaluate chest pain. Chest pain can be a symptom of coronary artery disease (CAD), and cardiac catheterization can show whether plaque is narrowing or blocking your heart's arteries. This procedure is also used to evaluate the valves, as well as measure the blood flow and oxygen levels in different parts of your heart. For further information please visit [email or web address].[email or web address]. Please follow instruction sheet, as given.   Follow-Up: At [facility], you and your health needs are our priority.  As part of our continuing mission to provide you with exceptional heart care, we have created designated Provider Care Teams.  These Care Teams include your primary Cardiologist (physician) and Advanced Practice Providers (APPs -  Physician Assistants and Nurse Practitioners) who all work together to provide you with the care you need, when you need it.  We recommend signing up for the patient portal called "MyChart".  Sign up information is provided on this After Visit Summary.  MyChart is used to connect with patients for Virtual Visits (Telemedicine).  Patients are able to view lab/test results, encounter notes, upcoming appointments, etc.  Non-urgent messages can be sent to your provider as well.   To learn more about what you can do with MyChart, go to [email or web address].    Your next appointment:   3 month(s)  The format for your next appointment:   In Person  Provider:   Eleonore Chiquito, MD   Other Instructions  Referral to Dr.Owens- they will contact you to schedule appointment.     [place] [address] [place] [address] Dept: [phone] Loc: [place]  09/17/2019  You are scheduled for a Cardiac Catheterization on Monday, May 17 with Dr. Harrell Gave End.  1. Please arrive at the [facility] (Main Entrance A) at [facility]: [place], [place] [address] at 8:30 AM (This time is two hours before your procedure to ensure your preparation). Free valet parking service is available.   Special note: Every effort is made to have your procedure done on time. Please understand that emergencies sometimes delay scheduled procedures.  2. Diet: Do not eat solid foods after midnight.  The patient may have clear liquids until 5am upon the day of the procedure.  3. Labs: You will need to have blood drawn today- CBC, BMET. You do not need to be fasting.  4. Medication instructions in preparation for your procedure:   Contrast Allergy: No  On the morning of your procedure, take your Aspirin and any morning medicines NOT listed above.  You may use sips of water.  5. Plan for one night stay--bring personal belongings. 6. Bring a current list of your medications and current insurance cards. 7. You MUST have a responsible person to drive you home. 8. Someone MUST be with you the first 24 hours after you arrive home or your discharge will be delayed. 9. Please wear clothes that are easy to get on and off and wear slip-on shoes.  Thank you for allowing [place] to care for you!   -- [place] [facility]  Time Spent with Patient: I have spent a total of 35 minutes with patient reviewing hospital notes, telemetry, EKGs, labs and examining the patient as well as establishing an assessment and plan that was discussed with the patient.  > 50% of time was spent in direct patient care.  Signed, Addison Naegeli. Audie Box, [place]  [address] [place], [place] [address] [phone]    09/17/2019 10:43 AM

## 2019-09-17 NOTE — Patient Instructions (Addendum)
Medication Instructions:  The current medical regimen is effective;  continue present plan and medications.  *If you need a refill on your cardiac medications before your next appointment, please call your pharmacy*   Lab Work: BMET, CBC today  COVID TESTING- Friday 05/14 at 11:55- [address] [place] [address]  If you have labs (blood work) drawn today and your tests are completely normal, you will receive your results only by: Marland Kitchen MyChart Message (if you have MyChart) OR . A paper copy in the mail If you have any lab test that is abnormal or we need to change your treatment, we will call you to review the results.   Testing/Procedures: Your physician has requested that you have a cardiac catheterization. Cardiac catheterization is used to diagnose and/or treat various heart conditions. Doctors may recommend this procedure for a number of different reasons. The most common reason is to evaluate chest pain. Chest pain can be a symptom of coronary artery disease (CAD), and cardiac catheterization can show whether plaque is narrowing or blocking your heart's arteries. This procedure is also used to evaluate the valves, as well as measure the blood flow and oxygen levels in different parts of your heart. For further information please visit HugeFiesta.[email or web address]. Please follow instruction sheet, as given.   Follow-Up: At [facility], you and your health needs are our priority.  As part of our continuing mission to provide you with exceptional heart care, we have created designated Provider Care Teams.  These Care Teams include your primary Cardiologist (physician) and Advanced Practice Providers (APPs -  Physician Assistants and Nurse Practitioners) who all work together to provide you with the care you need, when you need it.  We recommend signing up for the patient portal called "MyChart".  Sign up information is provided on this After Visit Summary.  MyChart is used to connect with  patients for Virtual Visits (Telemedicine).  Patients are able to view lab/test results, encounter notes, upcoming appointments, etc.  Non-urgent messages can be sent to your provider as well.   To learn more about what you can do with MyChart, go to [email or web address].    Your next appointment:   3 month(s)  The format for your next appointment:   In Person  Provider:   Eleonore Chiquito, MD   Other Instructions  Referral to Dr.Owens- they will contact you to schedule appointment.     [place] [address] Dept: [phone] Loc: [place]  09/17/2019  You are scheduled for a Cardiac Catheterization on Monday, May 17 with Dr. Harrell Gave End.  1. Please arrive at the [facility] (Main Entrance A) at [facility]: [place], [place] [address] at 8:30 AM (This time is two hours before your procedure to ensure your preparation). Free valet parking service is available.   Special note: Every effort is made to have your procedure done on time. Please understand that emergencies sometimes delay scheduled procedures.  2. Diet: Do not eat solid foods after midnight.  The patient may have clear liquids until 5am upon the day of the procedure.  3. Labs: You will need to have blood drawn today- CBC, BMET. You do not need to be fasting.  4. Medication instructions in preparation for your procedure:   Contrast Allergy: No  On the morning of your procedure, take your Aspirin and any morning medicines NOT listed above.  You may use sips of water.  5. Plan for one night stay--bring personal belongings. 6. Bring a current list of your medications and current insurance cards. 7. You MUST have a responsible person to drive you home. 8. Someone MUST be with you the first 24 hours after you arrive home or your discharge will be  delayed. 9. Please wear clothes that are easy to get on and off and wear slip-on shoes.  Thank you for allowing [place] to care for you!   -- [place] [facility]

## 2019-09-17 NOTE — Progress Notes (Signed)
Cardiology Office Note:   Date:  09/17/2019  NAME:  Oscar [name]    MRN: [id number] DOB:  11/24/1955   PCP:  Orma Flaming, MD  Cardiologist:  Evalina Field, MD   Referring MD: Orma Flaming, MD   Chief Complaint  Patient presents with  . Mitral Regurgitation   History of Present Illness:   Oscar [name] is a 64 y.o. male with a hx of mitral regurgitation who presents for follow-up.  He underwent a transesophageal echocardiogram yesterday.  This did reveal a flail P2 segment with severe mitral vegetation.  He does report that he possibly is a little more short of breath with some activity.  His wife is present by phone and she does report that he has become a little more short of breath as well.  Either way, given his flail segment he does need mitral valve surgery.  We did not know that he would end up having severe MR or we would have had his left heart catheterization done yesterday.  We will plan to set him up for this early next week.  I did discuss with him that he will need to have this repaired.  I do think he is a good candidate for cardiac surgery and an even better candidate for mitral valve repair given the pathology seen.  We will have him evaluated by Dr. Halford Chessman with [facility] for mitral valve repair.  He denies chest pain, shortness of breath, palpitations today.  1. Severe Mitral Regurgitation  -Flail P2 segment  -EF 65-70%  Past Medical History: Past Medical History:  Diagnosis Date  . Allergy   . Cataract   . Chronic kidney disease    [id number]  . Hyperlipidemia   . Insomnia   . Mitral regurgitation     Past Surgical History: Past Surgical History:  Procedure Laterality Date  . FOOT SURGERY     right  . SHOULDER ARTHROSCOPY    . thumb surgery     right  . TONSILLECTOMY    . VASECTOMY      Current Medications: Current Meds  Medication Sig  . Ascorbic Acid (VITAMIN C) 1000 MG tablet Take 1,000 mg by mouth daily.    .  clonazePAM (KLONOPIN) 1 MG tablet TAKE 1 TABLET BY MOUTH EVERYDAY AT BEDTIME (Patient taking differently: Take 1 mg by mouth at bedtime. )  . fluticasone (FLONASE) 50 MCG/ACT nasal spray Place 2 sprays into the nose 2 (two) times daily.  . Multiple Vitamin (MULTIVITAMIN) tablet Take 1 tablet by mouth daily.  . rosuvastatin (CRESTOR) 20 MG tablet Take 1 tablet (20 mg total) by mouth daily.  . Saw Palmetto, Serenoa repens, (SAW PALMETTO PO) Take 1 tablet by mouth daily.    . traZODone (DESYREL) 100 MG tablet Take one pill at night for sleep (Patient taking differently: Take 100 mg by mouth at bedtime. )     Allergies:    Poison ivy treatments [poison ivy treatments] and Pollen extract   Social History: Social History   Socioeconomic History  . Marital status: Married    Spouse name: Not on file  . Number of children: 4  . Years of education: Not on file  . Highest education level: Not on file  Occupational History  . Occupation: retired  Tobacco Use  . Smoking status: Never Smoker  . Smokeless tobacco: Never Used  Substance and Sexual Activity  . Alcohol use: Yes    Alcohol/week: 3.0 standard drinks  Types: 3 Cans of beer per week  . Drug use: No  . Sexual activity: Yes    Birth control/protection: Other-see comments    Comment: vasectomy  Other Topics Concern  . Not on file  Social History Narrative   ** Merged History Encounter **       Social Determinants of Health   Financial Resource Strain:   . Difficulty of Paying Living Expenses:   Food Insecurity:   . Worried About Charity fundraiser in the Last Year:   . Arboriculturist in the Last Year:   Transportation Needs:   . Film/video editor (Medical):   Marland Kitchen Lack of Transportation (Non-Medical):   Physical Activity:   . Days of Exercise per Week:   . Minutes of Exercise per Session:   Stress:   . Feeling of Stress :   Social Connections:   . Frequency of Communication with Friends and Family:   . Frequency  of Social Gatherings with Friends and Family:   . Attends Religious Services:   . Active Member of Clubs or Organizations:   . Attends Archivist Meetings:   Marland Kitchen Marital Status:      Family History: The patient's family history includes Arthritis in his maternal grandfather and maternal grandmother; Cancer in his brother, father, and mother; Heart attack in his maternal grandfather; Heart disease in his father and maternal grandfather. There is no history of Colon cancer, Esophageal cancer, or Stomach cancer.  ROS:   All other ROS reviewed and negative. Pertinent positives noted in the HPI.     EKGs/Labs/Other Studies Reviewed:   The following studies were personally reviewed by me today:  TEE 09/16/2019 1. There is severe prolapse of the P2 segment with a fail chord. There is  moderate P3 prolapse. There is severe MR with an eccentric jet directed  anteriorly with coanda effect. There is systolic pulmonary vein flow  reversal in the left upper pulmonary  vein. 2D PISA radius is 1.3 cm, 2D ERO 0.74 cm2, R vol 121 cc. 3D ERO 0.75  cm2. Flail width 14 mm. Flail gap 5.2 mm. PMVL length 19 mm. Overall,  findings consistent with severe mitral regurgitation. The mitral valve is  myxomatous. Severe mitral valve  regurgitation. No evidence of mitral stenosis.  2. Left ventricular ejection fraction, by estimation, is 65 to 70%. Left  ventricular ejection fraction by 3D volume is 71 %. The left ventricle has  normal function. The left ventricle has no regional wall motion  abnormalities.  3. Right ventricular systolic function is normal. The right ventricular  size is normal.  4. Left atrial size was mildly dilated. No left atrial/left atrial  appendage thrombus was detected. The LAA emptying velocity was 65 cm/s.  5. There appears to be a flail cord of the septal leflet. There is mild  TR associated with this. The tricuspid valve is myxomatous.  6. The aortic valve is  tricuspid. Aortic valve regurgitation is not  visualized. No aortic stenosis is present.  7. There is mild (Grade II) layered plaque involving the ascending aorta,  transverse aorta and descending aorta.  8. Agitated saline contrast bubble study was negative, with no evidence  of any interatrial shunt.   Recent Labs: 04/08/2019: ALT 29; TSH 1.84 07/23/2019: Platelets 199 09/16/2019: BUN 14; Creatinine, Ser 0.90; Hemoglobin 15.6; Potassium 3.9; Sodium 141   Recent Lipid Panel    Component Value Date/Time   CHOL 240 (H) 04/08/2019 1055  TRIG 146.0 04/08/2019 1055   HDL 58.50 04/08/2019 1055   CHOLHDL 4 04/08/2019 1055   VLDL 29.2 04/08/2019 1055   LDLCALC 152 (H) 04/08/2019 1055    Physical Exam:   VS:  BP 128/76   Pulse 92   Ht 6\' 2"  (1.88 m)   Wt 251 lb (113.9 kg)   SpO2 95%   BMI 32.23 kg/m    Wt Readings from Last 3 Encounters:  09/17/19 251 lb (113.9 kg)  09/16/19 247 lb (112 kg)  07/23/19 252 lb 12.8 oz (114.7 kg)    General: Well nourished, well developed, in no acute distress Heart: Atraumatic, normal size  Eyes: PEERLA, EOMI  Neck: Supple, no JVD Endocrine: No thryomegaly Cardiac: Normal S1, S2; RRR; 3 out of 6 holosystolic murmur Lungs: Clear to auscultation bilaterally, no wheezing, rhonchi or rales  Abd: Soft, nontender, no hepatomegaly  Ext: No edema, pulses 2+ Musculoskeletal: No deformities, BUE and BLE strength normal and equal Skin: Warm and dry, no rashes   Neuro: Alert and oriented to person, place, time, and situation, CNII-XII grossly intact, no focal deficits  Psych: Normal mood and affect   ASSESSMENT:   TIKI BRECHBIEL is a 64 y.o. male who presents for the following: 1. Nonrheumatic mitral valve regurgitation   2. Mixed hyperlipidemia     PLAN:   1. Nonrheumatic mitral valve regurgitation -Severe mitral vegetation.  He has a flail P2 segment with prolapse of P3.  He does have a flail segment or cord of the tricuspid septal leaflet but  no significant regurgitation.  No other significant valvular heart disease.  He does report some increase shortness of breath.  He therefore does meet criteria for intervention.  We will go ahead and get him set up for left heart catheterization on Monday.  I have also placed a referral to try thoracic surgery for him to be evaluated for repair by Dr. Halford Chessman.  I think you will be a good candidate for mitral valve repair we will await his evaluation by Dr. Ricard Dillon.  I did spend much of our visit explaining the need for mitral valve surgery to Mr. Shiner and his wife.  They are both in agreement and ready to move forward.  2. Mixed hyperlipidemia -Continue Crestor.  We will need to get his cholesterol under better control.  We will further titrate this based on the results of his left heart catheterization.  He reports no angina or symptoms concerning for underlying CAD.   Disposition: Return in about 3 months (around 12/18/2019).  Medication Adjustments/Labs and Tests Ordered: Current medicines are reviewed at length with the patient today.  Concerns regarding medicines are outlined above.  Orders Placed This Encounter  Procedures  . Basic metabolic panel  . CBC  . Ambulatory referral to Cardiothoracic Surgery   No orders of the defined types were placed in this encounter.   Patient Instructions  Medication Instructions:  The current medical regimen is effective;  continue present plan and medications.  *If you need a refill on your cardiac medications before your next appointment, please call your pharmacy*   Lab Work: BMET, CBC today  COVID TESTING- Friday 05/14 at 11:55- [address] [place] [address]  If you have labs (blood work) drawn today and your tests are completely normal, you will receive your results only by: Marland Kitchen MyChart Message (if you have MyChart) OR . A paper copy in the mail If you have any lab test that is  abnormal or we need to change your treatment,  we will call you to review the results.   Testing/Procedures: Your physician has requested that you have a cardiac catheterization. Cardiac catheterization is used to diagnose and/or treat various heart conditions. Doctors may recommend this procedure for a number of different reasons. The most common reason is to evaluate chest pain. Chest pain can be a symptom of coronary artery disease (CAD), and cardiac catheterization can show whether plaque is narrowing or blocking your heart's arteries. This procedure is also used to evaluate the valves, as well as measure the blood flow and oxygen levels in different parts of your heart. For further information please visit [email or web address]. Please follow instruction sheet, as given.   Follow-Up: At [facility], you and your health needs are our priority.  As part of our continuing mission to provide you with exceptional heart care, we have created designated Provider Care Teams.  These Care Teams include your primary Cardiologist (physician) and Advanced Practice Providers (APPs -  Physician Assistants and Nurse Practitioners) who all work together to provide you with the care you need, when you need it.  We recommend signing up for the patient portal called "MyChart".  Sign up information is provided on this After Visit Summary.  MyChart is used to connect with patients for Virtual Visits (Telemedicine).  Patients are able to view lab/test results, encounter notes, upcoming appointments, etc.  Non-urgent messages can be sent to your provider as well.   To learn more about what you can do with MyChart, go to [email or web address].    Your next appointment:   3 month(s)  The format for your next appointment:   In Person  Provider:   Eleonore Chiquito, MD   Other Instructions  Referral to Dr.Owens- they will contact you to schedule appointment.     [facility] [address] [place] [address] Dept: [phone] Loc: [place]  09/17/2019  You are scheduled for a Cardiac Catheterization on Monday, May 17 with Dr. Harrell Gave End.  1. Please arrive at the [facility] (Main Entrance A) at [facility]: [place], [place] [address] at 8:30 AM (This time is two hours before your procedure to ensure your preparation). Free valet parking service is available.   Special note: Every effort is made to have your procedure done on time. Please understand that emergencies sometimes delay scheduled procedures.  2. Diet: Do not eat solid foods after midnight.  The patient may have clear liquids until 5am upon the day of the procedure.  3. Labs: You will need to have blood drawn today- CBC, BMET. You do not need to be fasting.  4. Medication instructions in preparation for your procedure:   Contrast Allergy: No  On the morning of your procedure, take your Aspirin and any morning medicines NOT listed above.  You may use sips of water.  5. Plan for one night stay--bring personal belongings. 6. Bring a current list of your medications and current insurance cards. 7. You MUST have a responsible person to drive you home. 8. Someone MUST be with you the first 24 hours after you arrive home or your discharge will be delayed. 9. Please wear clothes that are easy to get on and off and wear slip-on shoes.  Thank you for allowing [place] to care for you!   -- [facility]  Time Spent with Patient: I have spent a total of 35 minutes with patient reviewing hospital notes, telemetry, EKGs, labs and examining the patient as well as establishing an assessment and plan that was discussed with the patient.  > 50% of time was spent in direct patient care.  Signed, Addison Naegeli. Audie Box, [place]  [address], [place], [place] [address] [phone]    09/17/2019 10:43 AM

## 2019-09-18 ENCOUNTER — Other Ambulatory Visit (HOSPITAL_COMMUNITY)
Admission: RE | Admit: 2019-09-18 | Discharge: 2019-09-18 | Disposition: A | Discharge status: Home / Self Care | Payer: Medicare Other | Source: Ambulatory Visit | Attending: Internal Medicine | Admitting: Internal Medicine

## 2019-09-18 DIAGNOSIS — Z20822 Contact with and (suspected) exposure to covid-19: Secondary | ICD-10-CM | POA: Diagnosis not present

## 2019-09-18 DIAGNOSIS — Z01812 Encounter for preprocedural laboratory examination: Secondary | ICD-10-CM | POA: Insufficient documentation

## 2019-09-18 LAB — SARS CORONAVIRUS 2 (TAT 6-24 HRS): SARS Coronavirus 2: NEGATIVE

## 2019-09-21 ENCOUNTER — Ambulatory Visit (HOSPITAL_COMMUNITY)
Admission: RE | Admit: 2019-09-21 | Discharge: 2019-09-21 | Disposition: A | Discharge status: Home / Self Care | Payer: Medicare Other | Attending: Internal Medicine | Admitting: Internal Medicine

## 2019-09-21 ENCOUNTER — Other Ambulatory Visit: Payer: Self-pay

## 2019-09-21 ENCOUNTER — Encounter (HOSPITAL_COMMUNITY)
Admission: RE | Disposition: A | Discharge status: Home / Self Care | Payer: Self-pay | Source: Home / Self Care | Attending: Internal Medicine

## 2019-09-21 DIAGNOSIS — I340 Nonrheumatic mitral (valve) insufficiency: Secondary | ICD-10-CM | POA: Diagnosis present

## 2019-09-21 DIAGNOSIS — E782 Mixed hyperlipidemia: Secondary | ICD-10-CM | POA: Insufficient documentation

## 2019-09-21 DIAGNOSIS — Z79899 Other long term (current) drug therapy: Secondary | ICD-10-CM | POA: Diagnosis not present

## 2019-09-21 DIAGNOSIS — R0602 Shortness of breath: Secondary | ICD-10-CM | POA: Diagnosis not present

## 2019-09-21 DIAGNOSIS — N189 Chronic kidney disease, unspecified: Secondary | ICD-10-CM | POA: Diagnosis not present

## 2019-09-21 DIAGNOSIS — G4700 Insomnia, unspecified: Secondary | ICD-10-CM | POA: Diagnosis not present

## 2019-09-21 HISTORY — PX: RIGHT/LEFT HEART CATH AND CORONARY ANGIOGRAPHY: CATH118266

## 2019-09-21 HISTORY — PX: CARDIAC CATHETERIZATION: SHX172

## 2019-09-21 LAB — POCT I-STAT EG7
Acid-Base Excess: 1 mmol/L (ref 0.0–2.0)
Bicarbonate: 27.3 mmol/L (ref 20.0–28.0)
Calcium, Ion: 1.24 mmol/L (ref 1.15–1.40)
HCT: 46 % (ref 39.0–52.0)
Hemoglobin: 15.6 g/dL (ref 13.0–17.0)
O2 Saturation: 72 %
Potassium: 4.1 mmol/L (ref 3.5–5.1)
Sodium: 141 mmol/L (ref 135–145)
TCO2: 29 mmol/L (ref 22–32)
pCO2, Ven: 49.2 mmHg (ref 44.0–60.0)
pH, Ven: 7.352 (ref 7.250–7.430)
pO2, Ven: 40 mmHg (ref 32.0–45.0)

## 2019-09-21 LAB — POCT I-STAT 7, (LYTES, BLD GAS, ICA,H+H)
Acid-Base Excess: 0 mmol/L (ref 0.0–2.0)
Bicarbonate: 26.4 mmol/L (ref 20.0–28.0)
Calcium, Ion: 1.25 mmol/L (ref 1.15–1.40)
HCT: 45 % (ref 39.0–52.0)
Hemoglobin: 15.3 g/dL (ref 13.0–17.0)
O2 Saturation: 98 %
Potassium: 4.1 mmol/L (ref 3.5–5.1)
Sodium: 140 mmol/L (ref 135–145)
TCO2: 28 mmol/L (ref 22–32)
pCO2 arterial: 49 mmHg — ABNORMAL HIGH (ref 32.0–48.0)
pH, Arterial: 7.341 — ABNORMAL LOW (ref 7.350–7.450)
pO2, Arterial: 117 mmHg — ABNORMAL HIGH (ref 83.0–108.0)

## 2019-09-21 SURGERY — RIGHT/LEFT HEART CATH AND CORONARY ANGIOGRAPHY
Anesthesia: LOCAL

## 2019-09-21 MED ORDER — LIDOCAINE HCL (PF) 1 % IJ SOLN
INTRAMUSCULAR | Status: DC | PRN
  Administered 2019-09-21 (×2): 2 mL

## 2019-09-21 MED ORDER — SODIUM CHLORIDE 0.9 % IV SOLN: INTRAVENOUS | Status: DC

## 2019-09-21 MED ORDER — SODIUM CHLORIDE 0.9 % WEIGHT BASED INFUSION
3.0000 mL/kg/h | INTRAVENOUS | Status: AC
  Administered 2019-09-21: 3 mL/kg/h via INTRAVENOUS

## 2019-09-21 MED ORDER — HEPARIN (PORCINE) IN NACL 1000-0.9 UT/500ML-% IV SOLN
INTRAVENOUS | Status: DC | PRN
  Administered 2019-09-21 (×2): 500 mL

## 2019-09-21 MED ORDER — HEPARIN SODIUM (PORCINE) 1000 UNIT/ML IJ SOLN
INTRAMUSCULAR | Status: DC | PRN
  Administered 2019-09-21: 5000 [IU] via INTRAVENOUS

## 2019-09-21 MED ORDER — MIDAZOLAM HCL 2 MG/2ML IJ SOLN
INTRAMUSCULAR | Status: AC
  Filled 2019-09-21: qty 2

## 2019-09-21 MED ORDER — SODIUM CHLORIDE 0.9 % IV SOLN: 250.0000 mL | INTRAVENOUS | Status: DC | PRN

## 2019-09-21 MED ORDER — SODIUM CHLORIDE 0.9% FLUSH: 3.0000 mL | Freq: Two times a day (BID) | INTRAVENOUS | Status: DC

## 2019-09-21 MED ORDER — FENTANYL CITRATE (PF) 100 MCG/2ML IJ SOLN
INTRAMUSCULAR | Status: DC | PRN
  Administered 2019-09-21 (×2): 25 ug via INTRAVENOUS

## 2019-09-21 MED ORDER — SODIUM CHLORIDE 0.9 % WEIGHT BASED INFUSION: 1.0000 mL/kg/h | INTRAVENOUS | Status: DC

## 2019-09-21 MED ORDER — ASPIRIN 81 MG PO CHEW: 81.0000 mg | CHEWABLE_TABLET | ORAL | Status: DC

## 2019-09-21 MED ORDER — ONDANSETRON HCL 4 MG/2ML IJ SOLN: 4.0000 mg | Freq: Four times a day (QID) | INTRAMUSCULAR | Status: DC | PRN

## 2019-09-21 MED ORDER — HEPARIN SODIUM (PORCINE) 1000 UNIT/ML IJ SOLN
INTRAMUSCULAR | Status: AC
  Filled 2019-09-21: qty 1

## 2019-09-21 MED ORDER — SODIUM CHLORIDE 0.9% FLUSH: 3.0000 mL | INTRAVENOUS | Status: DC | PRN

## 2019-09-21 MED ORDER — IOHEXOL 350 MG/ML SOLN
INTRAVENOUS | Status: DC | PRN
  Administered 2019-09-21: 75 mL

## 2019-09-21 MED ORDER — HEPARIN (PORCINE) IN NACL 1000-0.9 UT/500ML-% IV SOLN
INTRAVENOUS | Status: AC
  Filled 2019-09-21: qty 1000

## 2019-09-21 MED ORDER — FENTANYL CITRATE (PF) 100 MCG/2ML IJ SOLN
INTRAMUSCULAR | Status: AC
  Filled 2019-09-21: qty 2

## 2019-09-21 MED ORDER — ACETAMINOPHEN 325 MG PO TABS: 650.0000 mg | ORAL_TABLET | ORAL | Status: DC | PRN

## 2019-09-21 MED ORDER — VERAPAMIL HCL 2.5 MG/ML IV SOLN
INTRAVENOUS | Status: AC
  Filled 2019-09-21: qty 2

## 2019-09-21 MED ORDER — LIDOCAINE HCL (PF) 1 % IJ SOLN
INTRAMUSCULAR | Status: AC
  Filled 2019-09-21: qty 30

## 2019-09-21 MED ORDER — VERAPAMIL HCL 2.5 MG/ML IV SOLN
INTRAVENOUS | Status: DC | PRN
  Administered 2019-09-21: 10 mL via INTRA_ARTERIAL

## 2019-09-21 MED ORDER — LABETALOL HCL 5 MG/ML IV SOLN: 10.0000 mg | INTRAVENOUS | Status: DC | PRN

## 2019-09-21 MED ORDER — MIDAZOLAM HCL 2 MG/2ML IJ SOLN
INTRAMUSCULAR | Status: DC | PRN
  Administered 2019-09-21 (×2): 1 mg via INTRAVENOUS

## 2019-09-21 MED ORDER — HYDRALAZINE HCL 20 MG/ML IJ SOLN: 10.0000 mg | INTRAMUSCULAR | Status: DC | PRN

## 2019-09-21 SURGICAL SUPPLY — 13 items
CATH 5FR JL3.5 JR4 ANG PIG MP (CATHETERS) ×1 IMPLANT
CATH BALLN WEDGE 5F 110CM (CATHETERS) ×1 IMPLANT
CATH INFINITI 5 FR AR2 MOD (CATHETERS) ×1 IMPLANT
DEVICE RAD COMP TR BAND LRG (VASCULAR PRODUCTS) ×1 IMPLANT
GLIDESHEATH SLEND SS 6F .021 (SHEATH) ×1 IMPLANT
GUIDEWIRE INQWIRE 1.5J.035X260 (WIRE) IMPLANT
INQWIRE 1.5J .035X260CM (WIRE) ×2
KIT HEART LEFT (KITS) ×2 IMPLANT
PACK CARDIAC CATHETERIZATION (CUSTOM PROCEDURE TRAY) ×2 IMPLANT
SHEATH GLIDE SLENDER 4/5FR (SHEATH) ×1 IMPLANT
SHEATH PROBE COVER 6X72 (BAG) ×1 IMPLANT
TRANSDUCER W/STOPCOCK (MISCELLANEOUS) ×2 IMPLANT
TUBING CIL FLEX 10 FLL-RA (TUBING) ×2 IMPLANT

## 2019-09-21 NOTE — Discharge Instructions (Signed)
Drink plenty of fluids for 48 hours and keep wrist elevated at heart level for 24 hours  Radial Site Care   This sheet gives you information about how to care for yourself after your procedure. Your health care provider may also give you more specific instructions. If you have problems or questions, contact your health care provider. What can I expect after the procedure? After the procedure, it is common to have:  Bruising and tenderness at the catheter insertion area. Follow these instructions at home: Medicines  Take over-the-counter and prescription medicines only as told by your health care provider. Insertion site care 1. Follow instructions from your health care provider about how to take care of your insertion site. Make sure you: ? Wash your hands with soap and water before you change your bandage (dressing). If soap and water are not available, use hand sanitizer. ? Remove your dressing as told by your health care provider. In 24 hours 2. Check your insertion site every day for signs of infection. Check for: ? Redness, swelling, or pain. ? Fluid or blood. ? Pus or a bad smell. ? Warmth. 3. Do not take baths, swim, or use a hot tub until your health care provider approves. 4. You may shower 24-48 hours after the procedure, or as directed by your health care provider. ? Remove the dressing and gently wash the site with plain soap and water. ? Pat the area dry with a clean towel. ? Do not rub the site. That could cause bleeding. 5. Do not apply powder or lotion to the site. Activity   1. For 24 hours after the procedure, or as directed by your health care provider: ? Do not flex or bend the affected arm. ? Do not push or pull heavy objects with the affected arm. ? Do not drive yourself home from the hospital or clinic. You may drive 24 hours after the procedure unless your health care provider tells you not to. ? Do not operate machinery or power tools. 2. Do not lift  anything that is heavier than 10 lb (4.5 kg), or the limit that you are told, until your health care provider says that it is safe.  For 4 days 3. Ask your health care provider when it is okay to: ? Return to work or school. ? Resume usual physical activities or sports. ? Resume sexual activity. General instructions  If the catheter site starts to bleed, raise your arm and put firm pressure on the site. If the bleeding does not stop, get help right away. This is a medical emergency.  If you went home on the same day as your procedure, a responsible adult should be with you for the first 24 hours after you arrive home.  Keep all follow-up visits as told by your health care provider. This is important. Contact a health care provider if:  You have a fever.  You have redness, swelling, or yellow drainage around your insertion site. Get help right away if:  You have unusual pain at the radial site.  The catheter insertion area swells very fast.  The insertion area is bleeding, and the bleeding does not stop when you hold steady pressure on the area.  Your arm or hand becomes pale, cool, tingly, or numb. These symptoms may represent a serious problem that is an emergency. Do not wait to see if the symptoms will go away. Get medical help right away. Call your local emergency services (911 in the [place] Do   not drive yourself to the hospital. Summary  After the procedure, it is common to have bruising and tenderness at the site.  Follow instructions from your health care provider about how to take care of your radial site wound. Check the wound every day for signs of infection.  Do not lift anything that is heavier than 10 lb (4.5 kg), or the limit that you are told, until your health care provider says that it is safe. This information is not intended to replace advice given to you by your health care provider. Make sure you discuss any questions you have with your health care  provider. Document Revised: 05/29/2017 Document Reviewed: 05/29/2017 Elsevier Patient Education  2020 [employer]vier Inc.  

## 2019-09-21 NOTE — Brief Op Note (Addendum)
BRIEF CARDIAC CATHETERIZATION NOTE  09/21/2019  11:22 AM  PATIENT:  Oscar [name]  64 y.o. male  PRE-OPERATIVE DIAGNOSIS:  Mitral regurgitation  POST-OPERATIVE DIAGNOSIS:  Same  PROCEDURE:  Procedure(s): RIGHT/LEFT HEART CATH AND CORONARY ANGIOGRAPHY (N/A)  SURGEON:  Surgeon(s) and Role:    * Sanay Belmar, MD - Primary  FINDINGS: 1. Mild ostial D1 narrowing (~20%).  Otherwise, no angiographically significant coronary artery disease. 2. Upper normal to mildly elevated left and right heart filling pressures. 3. Low normal Fick cardiac output/index.  RECOMMENDATIONS: 1. Ongoing management of mitral regurgitation per Drs. Oscar [name] and Oscar [name]. 2. Primary prevention of coronary artery disease.  Oscar Bush, MD [facility]

## 2019-09-21 NOTE — Interval H&P Note (Signed)
History and Physical Interval Note:  09/21/2019 10:22 AM  Oscar [name]  has presented today for surgery, with the diagnosis of mitral regurgitation.  The various methods of treatment have been discussed with the patient and family. After consideration of risks, benefits and other options for treatment, the patient has consented to  Procedure(s): RIGHT/LEFT HEART CATH AND CORONARY ANGIOGRAPHY (N/A) as a surgical intervention.  The patient's history has been reviewed, patient examined, no change in status, stable for surgery.  I have reviewed the patient's chart and labs.  Questions were answered to the patient's satisfaction.    Cath Lab Visit (complete for each Cath Lab visit)  Clinical Evaluation Leading to the Procedure:   ACS: No.  Non-ACS:    Anginal/Heart Failure Classification: NYHA III  Anti-ischemic medical therapy: No Therapy  Non-Invasive Test Results: No non-invasive testing performed  Prior CABG: No previous CABG  [name]

## 2019-09-25 ENCOUNTER — Other Ambulatory Visit: Payer: Self-pay | Admitting: *Deleted

## 2019-09-25 ENCOUNTER — Encounter: Payer: Self-pay | Admitting: *Deleted

## 2019-09-25 ENCOUNTER — Institutional Professional Consult (permissible substitution): Payer: Medicare Other | Admitting: Thoracic Surgery (Cardiothoracic Vascular Surgery)

## 2019-09-25 ENCOUNTER — Other Ambulatory Visit: Payer: Self-pay | Admitting: Thoracic Surgery (Cardiothoracic Vascular Surgery)

## 2019-09-25 ENCOUNTER — Encounter: Payer: Self-pay | Admitting: Thoracic Surgery (Cardiothoracic Vascular Surgery)

## 2019-09-25 ENCOUNTER — Other Ambulatory Visit: Payer: Self-pay

## 2019-09-25 VITALS — BP 153/81 | HR 89 | Temp 97.3°F | Resp 18 | Ht 74.0 in | Wt 245.0 lb

## 2019-09-25 DIAGNOSIS — I340 Nonrheumatic mitral (valve) insufficiency: Secondary | ICD-10-CM | POA: Diagnosis not present

## 2019-09-25 NOTE — Progress Notes (Signed)
[address]       [place] [address]             [phone]     CARDIOTHORACIC SURGERY CONSULTATION REPORT  Referring Provider is Evalina Field, MD Primary Cardiologist is Evalina Field, MD PCP is Orma Flaming, MD  Chief Complaint  Patient presents with  . Mitral Regurgitation    new patient consultation, CATH/ TEE 09/21/19    HPI:  Patient is a 64 year old male with no previous cardiac history referred for surgical consultation to discuss treatment options for management of recently diagnosed mitral valve prolapse with severe symptomatic primary mitral regurgitation.  Patient states that he was just recently noted to have a heart murmur on physical exam by his primary care physician.  Transthoracic echocardiogram was performed demonstrating mitral valve prolapse with at least moderate to severe mitral regurgitation.  He was referred for cardiology consultation and evaluated by Dr. Davina Poke.  TEE confirmed the presence of mitral valve prolapse with a large flail segment involving the P2 segment of the posterior leaflet causing severe mitral regurgitation.  Diagnostic cardiac catheterization was performed and notable for the absence of significant coronary artery disease with normal right-sided pressures.  The patient was referred for surgical consultation.  Patient is married and [place] country near [place].  He has been retired for approximately 1-1/2 years having previously worked for [employer] for many years.  He has remained physically active throughout his adult life and into retirement.  The patient and his wife live on a large parcel of land and he spends a great deal of time working outdoors.  He enjoys hunting and golf.  He has noticed a tendency to get short of breath and tired with exertion.  Breathing has gotten considerably worse over the last few months.  He initially attributed this to presumed asthma and has sought relief by  sticking his head into the freezer to inhale cold air.  He has some tightness across his chest when he feels short of breath.  He has not had palpitations, dizzy spells, nor syncope.  He gets short of breath with moderate level activity.  He denies orthopnea or PND.  He reports increasing persistent dry nonproductive cough.  Past Medical History:  Diagnosis Date  . Allergy   . Cataract   . Chronic kidney disease    [id number]  . Hyperlipidemia   . Insomnia   . Mitral regurgitation     Past Surgical History:  Procedure Laterality Date  . BUBBLE STUDY  09/16/2019   Procedure: BUBBLE STUDY;  Surgeon: Geralynn Rile, MD;  Location: [facility];  Service: Cardiovascular;;  . FOOT SURGERY     right  . RIGHT/LEFT HEART CATH AND CORONARY ANGIOGRAPHY N/A 09/21/2019   Procedure: RIGHT/LEFT HEART CATH AND CORONARY ANGIOGRAPHY;  Surgeon: Nelva Bush, MD;  Location: [facility];  Service: Cardiovascular;  Laterality: N/A;  . SHOULDER ARTHROSCOPY    . TEE WITHOUT CARDIOVERSION N/A 09/16/2019   Procedure: TRANSESOPHAGEAL ECHOCARDIOGRAM (TEE);  Surgeon: Geralynn Rile, MD;  Location: [facility];  Service: Cardiovascular;  Laterality: N/A;  . thumb surgery     right  . TONSILLECTOMY    . VASECTOMY      Family History  Problem Relation Age of Onset  . Cancer Mother   . Cancer Brother   . Cancer Father   . Heart disease Father   . Arthritis Maternal Grandmother   .  Arthritis Maternal Grandfather   . Heart disease Maternal Grandfather   . Heart attack Maternal Grandfather   . Colon cancer Neg Hx   . Esophageal cancer Neg Hx   . Stomach cancer Neg Hx     Social History   Socioeconomic History  . Marital status: Married    Spouse name: Not on file  . Number of children: 4  . Years of education: Not on file  . Highest education level: Not on file  Occupational History  . Occupation: retired  Tobacco Use  . Smoking status: Never Smoker  . Smokeless tobacco:  Never Used  Substance and Sexual Activity  . Alcohol use: Yes    Alcohol/week: 3.0 standard drinks    Types: 3 Cans of beer per week  . Drug use: No  . Sexual activity: Yes    Birth control/protection: Other-see comments    Comment: vasectomy  Other Topics Concern  . Not on file  Social History Narrative   ** Merged History Encounter **       Social Determinants of Health   Financial Resource Strain:   . Difficulty of Paying Living Expenses:   Food Insecurity:   . Worried About Charity fundraiser in the Last Year:   . Arboriculturist in the Last Year:   Transportation Needs:   . Film/video editor (Medical):   Marland Kitchen Lack of Transportation (Non-Medical):   Physical Activity:   . Days of Exercise per Week:   . Minutes of Exercise per Session:   Stress:   . Feeling of Stress :   Social Connections:   . Frequency of Communication with Friends and Family:   . Frequency of Social Gatherings with Friends and Family:   . Attends Religious Services:   . Active Member of Clubs or Organizations:   . Attends Archivist Meetings:   Marland Kitchen Marital Status:   Intimate Partner Violence:   . Fear of Current or Ex-Partner:   . Emotionally Abused:   Marland Kitchen Physically Abused:   . Sexually Abused:     Current Outpatient Medications  Medication Sig Dispense Refill  . Ascorbic Acid (VITAMIN C) 1000 MG tablet Take 1,000 mg by mouth daily.      . clonazePAM (KLONOPIN) 1 MG tablet TAKE 1 TABLET BY MOUTH EVERYDAY AT BEDTIME (Patient taking differently: Take 1 mg by mouth at bedtime. ) 90 tablet 0  . fluticasone (FLONASE) 50 MCG/ACT nasal spray Place 2 sprays into the nose 2 (two) times daily. 16 g 0  . Multiple Vitamin (MULTIVITAMIN) tablet Take 1 tablet by mouth daily.    . rosuvastatin (CRESTOR) 20 MG tablet Take 1 tablet (20 mg total) by mouth daily. 90 tablet 3  . Saw Palmetto, Serenoa repens, (SAW PALMETTO PO) Take 1 tablet by mouth daily.      . traZODone (DESYREL) 100 MG tablet Take  one pill at night for sleep (Patient taking differently: Take 100 mg by mouth at bedtime. ) 90 tablet 3   No current facility-administered medications for this visit.    Allergies  Allergen Reactions  . Poison Ivy Treatments [Poison Ivy Treatments]     Allergic to poison ivy  . Pollen Extract       Review of Systems:   General:  normal appetite, decreased energy, no weight gain, no weight loss, no fever  Cardiac:  no chest pain with exertion, occasional chest pain at rest, +SOB with exertion, occasional resting SOB, no PND, no  orthopnea, no palpitations, no arrhythmia, no atrial fibrillation, no LE edema, + dizzy spells, no syncope  Respiratory:  + shortness of breath, no home oxygen, no productive cough, + dry cough, no bronchitis, no wheezing, no hemoptysis, no asthma, no pain with inspiration or cough, possible sleep apnea, no CPAP at night  GI:   no difficulty swallowing, no reflux, + frequent heartburn, no hiatal hernia, no abdominal pain, no constipation, no diarrhea, no hematochezia, no hematemesis, no melena  GU:   no dysuria,  + frequency, no urinary tract infection, no hematuria, + enlarged prostate,  kidney stones, no kidney disease  Vascular:  no pain suggestive of claudication, no pain in feet, no leg cramps, no varicose veins, no DVT, no non-healing foot ulcer  Neuro:   no stroke, no TIA's, no seizures, occasional headaches, no temporary blindness one eye,  no slurred speech, no peripheral neuropathy, no chronic pain, no instability of gait, slight memory/cognitive dysfunction  Musculoskeletal: no arthritis, no joint swelling, occasional myalgias, no difficulty walking, normal mobility   Skin:   no rash, no itching, no skin infections, no pressure sores or ulcerations  Psych:   + anxiety, no depression, no nervousness, no unusual recent stress  Eyes:   no blurry vision, + floaters, no recent vision changes, + wears glasses or contacts  ENT:   + hearing loss, no loose or  painful teeth, no dentures, last saw dentist April 2021  Hematologic:  no easy bruising, no abnormal bleeding, no clotting disorder, no frequent epistaxis  Endocrine:  no diabetes, does not check CBG's at home     Physical Exam:   BP (!) 153/81 (BP Location: Left Arm, Patient Position: Sitting, Cuff Size: Normal)   Pulse 89   Temp (!) 97.3 F (36.3 C)   Resp 18   Ht 6\' 2"  (1.88 m)   Wt 245 lb (111.1 kg)   SpO2 95% Comment: RA  BMI 31.46 kg/m   General:  Mildly obese,  well-appearing  HEENT:  Unremarkable   Neck:   no JVD, no bruits, no adenopathy   Chest:   clear to auscultation, symmetrical breath sounds, no wheezes, no rhonchi   CV:   RRR, grade IV/VI holosystolic murmur   Abdomen:  soft, non-tender, no masses   Extremities:  warm, well-perfused, pulses palpable, no LE edema  Rectal/GU  Deferred  Neuro:   Grossly non-focal and symmetrical throughout  Skin:   Clean and dry, no rashes, no breakdown   Diagnostic Tests:  ECHOCARDIOGRAM REPORT       Patient Name:  Oscar [name] Date of Exam: 04/10/2019  Medical Rec #: [id number]     Height:    74.0 in  Accession #:  [id number]    Weight:    252.0 lb  Date of Birth: 1955/10/22     BSA:     2.40 m  Patient Age:  78 years     BP:      140/78 mmHg  Patient Gender: M         HR:      84 bpm.  Exam Location: [address]   Procedure: 2D Echo, Color Doppler and Cardiac Doppler   Indications:  R01.1 Murmur    History:    Patient has no prior history of Echocardiogram  examinations.         Risk Factors:Dyslipidemia.    Sonographer:  Jessee Avers, RDCS  Referring Phys: [id number] [place]    1. The  mitral valve is abnormal. Prolapse of the posterior leaflet. There  is eccentric anterior directed jet. Appears to be moderate mitral  regurgitation, though could be underestimating given eccentric jet. Normal  left atrial size  and E wave <1.2  argues against severe MR, though does have mild LV dilatation of unclear  cause. Comparing MV annular inflow to LVOT SV, the regurgitant volume is  50cc (RF 34%), suggestive of moderate MR. If clinical suspicion for severe  MR, consider cardiac MRI or TEE  for further evaluation.  2. Left ventricular ejection fraction, by visual estimation, is 60 to  65%. The left ventricle has normal function. There is no left ventricular  hypertrophy.  3. Mildly dilated left ventricular internal cavity size.  4. Global right ventricle has normal systolic function.The right  ventricular size is normal. No increase in right ventricular wall  thickness.  5. Left atrial size was normal.  6. Right atrial size was normal.  7. The tricuspid valve is normal in structure. Tricuspid valve  regurgitation is not demonstrated.  8. The aortic valve is tricuspid. Aortic valve regurgitation is not  visualized.  9. The pulmonic valve was not well visualized. Pulmonic valve  regurgitation is not visualized.  10. There is mild dilatation of the ascending aorta measuring 36 mm.  11. TR signal is inadequate for assessing pulmonary artery systolic  pressure.   FINDINGS  Left Ventricle: Left ventricular ejection fraction, by visual estimation,  is 60 to 65%. The left ventricle has normal function. The left ventricular  internal cavity size was mildly dilated left ventricle. There is no left  ventricular hypertrophy. Left  ventricular diastolic parameters were normal.   Right Ventricle: The right ventricular size is normal. No increase in  right ventricular wall thickness. Global RV systolic function is has  normal systolic function.   Left Atrium: Left atrial size was normal in size.   Right Atrium: Right atrial size was normal in size   Pericardium: There is no evidence of pericardial effusion.   Mitral Valve: The mitral valve is abnormal. Moderate mitral valve  regurgitation, with  eccentric anteriorly directed jet.   Tricuspid Valve: The tricuspid valve is normal in structure. Tricuspid  valve regurgitation is not demonstrated.   Aortic Valve: The aortic valve is tricuspid. Aortic valve regurgitation is  not visualized.   Pulmonic Valve: The pulmonic valve was not well visualized. Pulmonic valve  regurgitation is not visualized.   Aorta: Aortic dilatation noted. There is mild dilatation of the ascending  aorta measuring 36 mm.   IAS/Shunts: The interatrial septum was not well visualized.     LEFT VENTRICLE  PLAX 2D  LVIDd:     5.90 cm Diastology  LVIDs:     3.90 cm LV e' lateral:  15.60 cm/s  LV PW:     1.00 cm LV E/e' lateral: 6.8  LV IVS:    0.80 cm LV e' medial:  9.32 cm/s  LVOT diam:   2.60 cm LV E/e' medial: 11.4  LV SV:     107 ml  LV SV Index:  43.38  LVOT Area:   5.31 cm     RIGHT VENTRICLE  RV Basal diam: 3.00 cm  RV S prime:   6.57 cm/s   LEFT ATRIUM       Index    RIGHT ATRIUM      Index  LA diam:    4.50 cm 1.88 cm/m RA Area:   12.00 cm  LA Vol (A2C):  47.6 ml 19.85 ml/m RA Volume:  28.90 ml 12.05 ml/m  LA Vol (A4C):  48.0 ml 20.02 ml/m  LA Biplane Vol: 48.0 ml 20.02 ml/m  AORTIC VALVE  LVOT Vmax:  108.00 cm/s  LVOT Vmean: 74.800 cm/s  LVOT VTI:  0.186 m    AORTA  Ao Root diam: 3.20 cm  Ao Asc diam: 3.60 cm   MITRAL VALVE                    SHUNTS                    Systemic VTI: 0.19 m  MV Decel Time: 155 msec       Systemic Diam: 2.60 cm  MV E velocity: 106.00 cm/s 103 cm/s  MV A velocity: 63.50 cm/s 70.3 cm/s  MV E/A ratio: 1.67    1.5     Oswaldo Milian MD  Electronically signed by Oswaldo Milian MD  Signature Date/Time: 04/10/2019/6:49:02 PM      TRANSESOPHOGEAL ECHO REPORT       Patient Name:  Marlou Porch Date of Exam: 09/16/2019  Medical Rec #: [id number]      Height:    74.0 in  Accession #:  [id number]    Weight:    247.0 lb  Date of Birth: 08/01/1955     BSA:     2.377 m  Patient Age:  39 years     BP:      137/110 mmHg  Patient Gender: M         HR:      78 bpm.  Exam Location: Outpatient   Procedure: 3D Echo, Transesophageal Echo, Cardiac Doppler, Color Doppler  and       Saline Contrast Bubble Study   Indications:  mitral regurgitation    History:    Patient has prior history of Echocardiogram examinations,  most         recent 04/10/2019.    Sonographer:  Johny Chess  Referring Phys: [id number] [place]   PROCEDURE: After discussion of the risks and benefits of a TEE, an  informed consent was obtained from the patient. TEE procedure time was 25  minutes. The transesophogeal probe was passed without difficulty through  the esophogus of the patient. Local  oropharyngeal anesthetic was provided with viscous lidocaine. Sedation  performed by different physician. The patient was monitored while under  deep sedation. Anesthestetic sedation was provided intravenously by  Anesthesiology: 527mg  of Propofol. Image  quality was excellent. The patient's vital signs; including heart rate,  blood pressure, and oxygen saturation; remained stable throughout the  procedure. The patient developed no complications during the procedure.   IMPRESSIONS    1. There is severe prolapse of the P2 segment with a fail chord. There is  moderate P3 prolapse. There is severe MR with an eccentric jet directed  anteriorly with coanda effect. There is systolic pulmonary vein flow  reversal in the left upper pulmonary  vein. 2D PISA radius is 1.3 cm, 2D ERO 0.74 cm2, R vol 121 cc. 3D ERO 0.75  cm2. Flail width 14 mm. Flail gap 5.2 mm. PMVL length 19 mm. Overall,  findings consistent with severe mitral regurgitation. The mitral valve is  myxomatous. Severe mitral valve   regurgitation. No evidence of mitral stenosis.  2. Left ventricular ejection fraction, by estimation, is 65 to 70%. Left  ventricular ejection fraction by 3D volume is 71 %. The left ventricle has  normal function. The left ventricle has no regional wall motion  abnormalities.  3. Right ventricular systolic function is normal. The right ventricular  size is normal.  4. Left atrial size was mildly dilated. No left atrial/left atrial  appendage thrombus was detected. The LAA emptying velocity was 65 cm/s.  5. There appears to be a flail cord of the septal leflet. There is mild  TR associated with this. The tricuspid valve is myxomatous.  6. The aortic valve is tricuspid. Aortic valve regurgitation is not  visualized. No aortic stenosis is present.  7. There is mild (Grade II) layered plaque involving the ascending aorta,  transverse aorta and descending aorta.  8. Agitated saline contrast bubble study was negative, with no evidence  of any interatrial shunt.   FINDINGS  Left Ventricle: Left ventricular ejection fraction, by estimation, is 65  to 70%. Left ventricular ejection fraction by 3D volume is 71 %. The left  ventricle has normal function. The left ventricle has no regional wall  motion abnormalities. The left  ventricular internal cavity size was normal in size. There is no left  ventricular hypertrophy.   Right Ventricle: The right ventricular size is normal. No increase in  right ventricular wall thickness. Right ventricular systolic function is  normal.   Left Atrium: Left atrial size was mildly dilated. No left atrial/left  atrial appendage thrombus was detected. The LAA emptying velocity was 65  cm/s.   Right Atrium: Right atrial size was normal in size.   Pericardium: There is no evidence of pericardial effusion.   Mitral Valve: There is severe prolapse of the P2 segment with a fail  chord. There is moderate P3 prolapse. There is severe MR with an  eccentric  jet directed anteriorly with coanda effect. There is systolic pulmonary  vein flow reversal in the left upper  pulmonary vein. 2D PISA radius is 1.3 cm, 2D ERO 0.74 cm2, R vol 121 cc.  3D ERO 0.75 cm2. Flail width 14 mm. Flail gap 5.2 mm. PMVL length 19 mm.  Overall, findings consistent with severe mitral regurgitation. The mitral  valve is myxomatous. Severe mitral  valve regurgitation, with anteriorly-directed jet. No evidence of mitral  valve stenosis.   Tricuspid Valve: There appears to be a flail cord of the septal leflet.  There is mild TR associated with this. The tricuspid valve is myxomatous.  Tricuspid valve regurgitation is mild . No evidence of tricuspid stenosis.   Aortic Valve: The aortic valve is tricuspid. Aortic valve regurgitation is  not visualized. No aortic stenosis is present.   Pulmonic Valve: The pulmonic valve was normal in structure. Pulmonic valve  regurgitation is not visualized. No evidence of pulmonic stenosis.   Aorta: The aortic root and ascending aorta are structurally normal, with  no evidence of dilitation. There is mild (Grade II) layered plaque  involving the ascending aorta, transverse aorta and descending aorta.   Venous: The left upper pulmonary vein is normal. A pattern of systolic  flow reversal, suggestive of severe mitral regurgitation is recorded from  the left upper pulmonary vein.   IAS/Shunts: No atrial level shunt detected by color flow Doppler. Agitated  saline contrast was given intravenously to evaluate for intracardiac  shunting. Agitated saline contrast bubble study was negative, with no  evidence of any interatrial shunt.       3D Volume EF  LV 3D EF:  Left ventricular ejection fraction by 3D volume is 71        %.  3D Volume EF  LV 3D EF:  71.30 %  LV 3D EDV:  139100.00 mm  LV 3D ESV:  39900.00 mm  LV 3D SV:  99300.00 mm    3D Volume EF:  3D EF:    71 %     AORTA  Ao Root  diam: 3.10 cm  Ao Asc diam: 3.09 cm   MR Peak grad:  121.9 mmHg TRICUSPID VALVE  MR Mean grad:  87.0 mmHg  TR Peak grad:  24.0 mmHg  MR Vmax:     552.00 cm/s TR Vmax:    245.00 cm/s  MR Vmean:    444.0 cm/s  MR PISA:     10.62 cm  MR PISA Eff ROA: 74 mm  MR PISA Radius: 1.30 cm   Eleonore Chiquito MD  Electronically signed by Eleonore Chiquito MD  Signature Date/Time: 09/16/2019/1:09:30 PM      RIGHT/LEFT HEART CATH AND CORONARY ANGIOGRAPHY  Conclusion  Conclusions: 1. Mild ostial D1 narrowing (~20%).  Otherwise, no angiographically significant coronary artery disease. 2. Upper normal to mildly elevated left and right heart filling pressures. 3. Low normal Fick cardiac output/index.  Recommendations: 1. Ongoing management of mitral regurgitation per Drs. O'Neal and Roxy Manns. 2. Primary prevention of coronary artery disease.  Nelva Bush, MD [facility]  Recommendations  Antiplatelet/Anticoag No indication for antiplatelet therapy at this time .  Discharge Date In the absence of any other complications or medical issues, we expect the patient to be ready for discharge on 09/21/2019.  Surgeon Notes    09/21/2019 9:40 PM Brief Op Note addendum by Nelva Bush, MD    09/16/2019 9:48 AM CV Procedure signed by Geralynn Rile, MD  Indications  Nonrheumatic mitral valve regurgitation [I34.0 (ICD-10-CM)]  Procedural Details  Technical Details Indication: 64 y.o. year-old man with history of hypertension and severe mitral regurgitation with flair P2 segment, presenting for preoperative evaluation in anticipation of mitral valve repair.  GFR: >60 ml/min  Procedure: The risks, benefits, complications, treatment options, and expected outcomes were discussed with the patient. The patient and/or family concurred with the proposed plan, giving informed consent. The patient was sedated with IV midazolam and fentanyl. The right wrist was assessed with a  modified Allens test which was normal. The right wrist and elbow were prepped and draped in a sterile fashion. 1% lidocaine was used for local anesthesia. A previously placed antecubital vein IV was exchanged for a 72F slender Glidesheath using modified Seldinger technique. Right heart catheterization was performed by advancing a 72F balloon-tipped catheter through the right heart chambers into the pulmonary capillary wedge position. Pressure measurements and oxygen saturations were obtained.  Using the modified Seldinger access technique, a 432F slender Glidesheath was placed in the right radial artery. 3 mg Verapamil was given through the sheath. Heparin 5,000 units were administered.  Selective coronary angiography was performed using 72F JL3.5 and AR2 catheters to engage the left and right coronary arteries, respectively. Left heart catheterization was performed using a 72F JR4 catheter. Left ventriculogram was not performed.  At the end of the procedure, the radial artery sheath was removed and a TR band applied to achieve patent hemostasis. There were no immediate complications. The patient was taken to the recovery area in stable condition.  Estimated blood loss <50 mL.   During this procedure medications were administered to achieve and maintain moderate conscious sedation while the patient's heart rate, blood pressure, and oxygen saturation were continuously monitored and I was present face-to-face 100% of  this time.  Medications (Filter: Administrations occurring from 09/21/19 1015 to 09/21/19 1114) Heparin (Porcine) in NaCl 1000-0.9 UT/500ML-% SOLN (mL) Total volume:  1,000 mL Date/Time  Rate/Dose/Volume Action  09/21/19 1031  500 mL Given  1032  500 mL Given    fentaNYL (SUBLIMAZE) injection (mcg) Total dose:  50 mcg Date/Time  Rate/Dose/Volume Action  09/21/19 1033  25 mcg Given  1045  25 mcg Given    midazolam (VERSED) injection (mg) Total dose:  2 mg Date/Time  Rate/Dose/Volume  Action  09/21/19 1033  1 mg Given  1045  1 mg Given    lidocaine (PF) (XYLOCAINE) 1 % injection (mL) Total volume:  4 mL Date/Time  Rate/Dose/Volume Action  09/21/19 1041  2 mL Given  1045  2 mL Given    Radial Cocktail/Verapamil only (mL) Total volume:  10 mL Date/Time  Rate/Dose/Volume Action  09/21/19 1050  10 mL Given    heparin sodium (porcine) injection (Units) Total dose:  5,000 Units Date/Time  Rate/Dose/Volume Action  09/21/19 1053  5,000 Units Given    iohexol (OMNIPAQUE) 350 MG/ML injection (mL) Total volume:  75 mL Date/Time  Rate/Dose/Volume Action  09/21/19 1111  75 mL Given    Sedation Time  Sedation Time Physician-1: 35 minutes 50 seconds  Contrast  Medication Name Total Dose  iohexol (OMNIPAQUE) 350 MG/ML injection 75 mL    Radiation/Fluoro  Fluoro time: 5.7 (min) DAP: 32075 (mGycm2) Cumulative Air Kerma: 123456 (mGy)  Complications  Complications documented before study signed (09/21/2019 [id number] PM)   No complications were associated with this study.  Documented by Nelva Bush, MD - 09/21/2019 9:37 PM    Coronary Findings  Diagnostic Dominance: Right Left Main  Vessel is large. Vessel is angiographically normal.  Left Anterior Descending  First Diagonal Branch  Vessel is moderate in size.  1st Diag lesion 20% stenosed  1st Diag lesion is 20% stenosed.  Second Diagonal Branch  Vessel is small in size.  Left Circumflex  Vessel is large. Vessel is angiographically normal.  First Obtuse Marginal Branch  Vessel is small in size.  Second Obtuse Marginal Branch  Vessel is large in size.  Third Obtuse Marginal Branch  Vessel is small in size.  Right Coronary Artery  Vessel is large. Vessel is angiographically normal.  Right Posterior Descending Artery  Vessel is small in size.  Right Posterior Atrioventricular Artery  Vessel is moderate in size.  Intervention  No interventions have been documented. Right Heart  Right Heart  Pressures RA (mean): 5 mmHg RV (S/EDP): 30/7 mmHg PA (S/D, mean): 30/15 (20) mmHg PCWP (mean): 15 mmHg  Ao sat: 98% PA sat: 72%  Fick CO: 5.6 L/min Fick CI: 2.4 L/min/m^2  Left Heart  Left Ventricle LV end diastolic pressure is mildly elevated. LVEDP 15-20 mmHg.  Aortic Valve There is no aortic valve stenosis.  Coronary Diagrams  Diagnostic Dominance: Right  Intervention  Implants   No implant documentation for this case.  Syngo Images  Show images for CARDIAC CATHETERIZATION  Images on Long Term Storage  Show images for Phillipson, Doel Heiting "Richardson Landry"   Link to Procedure Log  Procedure Log    Hemo Data   Most Recent Value  Fick Cardiac Output 5.64 L/min  Fick Cardiac Output Index 2.37 (L/min)/BSA  RA A Wave 6 mmHg  RA V Wave 4 mmHg  RA Mean 3 mmHg  RV Systolic Pressure 25 mmHg  RV Diastolic Pressure 2 mmHg  RV EDP 7 mmHg  PA Systolic  Pressure 26 mmHg  PA Diastolic Pressure 11 mmHg  PA Mean 19 mmHg  PW A Wave 15 mmHg  PW V Wave 21 mmHg  PW Mean 11 mmHg  LV Systolic Pressure 97 mmHg  LV Diastolic Pressure 4 mmHg  LV EDP 16 mmHg  AOp Systolic Pressure 87 mmHg  AOp Diastolic Pressure 54 mmHg  AOp Mean Pressure 70 mmHg  LVp Systolic Pressure 95 mmHg  LVp Diastolic Pressure 4 mmHg  LVp EDP Pressure 16 mmHg  QP/QS 1  TPVR Index 8.04 HRUI      Impression:  Patient has mitral valve prolapse with stage D severe symptomatic primary mitral regurgitation.  He reports progressive symptoms of exertional shortness of breath and fatigue consistent with chronic diastolic congestive heart failure, New York Heart Association functional class II.  Symptoms seem to be accelerating and occur with less and less activity.  I have personally reviewed the patient's recent transthoracic and transesophageal echocardiograms and diagnostic cardiac catheterization.  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  Left ventricular systolic function remains preserved.  Diagnostic cardiac catheterization is notable for the absence of significant coronary artery disease and normal right-sided pressures.  I agree the patient needs mitral valve repair.  Based upon review the patient's TEE and clinical history I feel there is better than 95% likelihood of successful mitral valve repair with durable long-term result and less than 1% risk of mortality or major morbidity.  The patient may be a good candidate for minimally invasive approach for surgery.   Plan:  The patient was and his wife were counseled at length regarding the indications, risks and potential benefits of mitral valve repair.  The rationale for elective surgery has been explained, including a comparison between surgery and continued medical therapy with close follow-up.  The likelihood of successful and durable mitral valve repair has been discussed with particular reference to the findings of their recent echocardiogram.  Based upon these findings and previous experience, I have quoted them a greater than 95 percent likelihood of successful valve repair with less than 1 percent risk of mortality or major morbidity.  Alternative surgical approaches have been discussed including a comparison between conventional sternotomy and minimally-invasive techniques.  The relative risks and benefits of each have been reviewed as they pertain to the patient's specific circumstances, and expectations for the patient's postoperative convalescence has been discussed.  The patient desires to proceed with surgery as soon as practical.  We tentatively plan to proceed with surgery on October 20, 2019.  Patient will undergo CT angiography to evaluate the feasibility of peripheral cannulation for surgery.  He will return to our office  prior to surgery on October 19, 2019.    I spent in excess of 90 minutes during the conduct of this office consultation and >50% of this time involved direct face-to-face encounter with the patient for counseling and/or coordination of their care.    Valentina Gu. Roxy Manns, MD 09/25/2019 10:04 AM

## 2019-09-25 NOTE — Patient Instructions (Signed)
  Continue taking all current medications without change through the day before surgery.  Make sure to bring all of your medications with you when you come for your Pre-Admission Testing appointment at [facility] Short-Stay Department.  Have nothing to eat or drink after midnight the night before surgery.  On the morning of surgery do not take any medications.  At your appointment [facility] you will be asked to sign permission forms for your upcoming surgery.  By definition your signature on these forms implies that you and/or your designee provide full informed consent for your planned surgical procedure(s), that alternative treatment options have been discussed, that you understand and accept any and all potential risks, and that you have some understanding of what to expect for your post-operative convalescence.  For any major cardiac surgical procedure potential operative risks include but are not limited to at least some risk of death, stroke or other neurologic complication, myocardial infarction, congestive heart failure, respiratory failure, renal failure, bleeding requiring blood transfusion and/or reexploration, irregular heart rhythm, heart block or bradycardia requiring permanent pacemaker, pneumonia, pericardial effusion, pleural effusion, wound infection, pulmonary embolus or other thromboembolic complication, chronic pain, or other complications related to the specific procedure(s) performed.  Please call to schedule a follow-up appointment in our office prior to surgery if you have any unresolved questions about your planned surgical procedure, the associated risks, alternative treatment options, and/or expectations for your post-operative recovery.   

## 2019-09-28 ENCOUNTER — Encounter: Payer: Self-pay | Admitting: Family Medicine

## 2019-09-28 ENCOUNTER — Other Ambulatory Visit: Payer: Self-pay

## 2019-09-28 MED ORDER — TRAZODONE HCL 100 MG PO TABS: ORAL_TABLET | ORAL | 3 refills | Status: DC

## 2019-09-30 ENCOUNTER — Encounter: Payer: Medicare Other | Admitting: Thoracic Surgery (Cardiothoracic Vascular Surgery)

## 2019-10-04 ENCOUNTER — Encounter: Payer: Self-pay | Admitting: Family Medicine

## 2019-10-06 ENCOUNTER — Encounter: Payer: Self-pay | Admitting: Family Medicine

## 2019-10-15 NOTE — Pre-Procedure Instructions (Signed)
[id number] [address] [place], [place] [address] [place] [address] Phone: [phone] Fax: [phone]  [place], [place] [address], [address] [place], [address] [place] [address] Phone: [phone] Fax: [phone]      Your procedure is scheduled on Tuesday June 15th.  Report to [facility] Main Entrance "A" at 5:30 A.M., and check in at the Admitting office.  Call this number if you have problems the morning of surgery:  [phone]  Call [phone] if you have any questions prior to your surgery date Monday-Friday 8am-4pm    Remember:  Do not eat or drink after midnight the night before your surgery   Take these medicines the morning of surgery with A SIP OF WATER   fluticasone (FLONASE)  rosuvastatin (CRESTOR) 20 MG  As of today, STOP taking any Aspirin (unless otherwise instructed by your surgeon) and Aspirin containing products, Aleve, Naproxen, Ibuprofen, Motrin, Advil, Goody's, BC's, all herbal medications, fish oil, and all vitamins.                      Do not wear jewelry            Do not wear lotions, powders, colognes, or deodorant.            Do not Men may shave face and neck.            Do not bring valuables to the hospital.            [facility] is not responsible for any belongings or valuables.  Do NOT Smoke (Tobacco/Vapping) or drink Alcohol 24 hours prior to your procedure If you use a CPAP at night, you may bring all equipment for your overnight stay.   Contacts, glasses, dentures or bridgework may not be worn into surgery.      For patients admitted to the hospital, discharge time will be determined by your treatment team.   Patients discharged the day of surgery will not be allowed to drive home, and someone needs to stay with them for 24 hours.    Special instructions:   [place]- Preparing For Surgery  Before  surgery, you can play an important role. Because skin is not sterile, your skin needs to be as free of germs as possible. You can reduce the number of germs on your skin by washing with CHG (chlorahexidine gluconate) Soap before surgery.  CHG is an antiseptic cleaner which kills germs and bonds with the skin to continue killing germs even after washing.    Oral Hygiene is also important to reduce your risk of infection.  Remember - BRUSH YOUR TEETH THE MORNING OF SURGERY WITH YOUR REGULAR TOOTHPASTE  Please do not use if you have an allergy to CHG or antibacterial soaps. If your skin becomes reddened/irritated stop using the CHG.  Do not shave (including legs and underarms) for at least 48 hours prior to first CHG shower. It is OK to shave your face.  Please follow these instructions carefully.   1. Shower the NIGHT BEFORE SURGERY and the MORNING OF SURGERY with CHG Soap.   2. If you chose to wash your hair, wash your hair first as usual with your normal shampoo.  3. After you shampoo, rinse your hair and body thoroughly to remove the shampoo.  4. Use CHG as you would any other  liquid soap. You can apply CHG directly to the skin and wash gently with a scrungie or a clean washcloth.   5. Apply the CHG Soap to your body ONLY FROM THE NECK DOWN.  Do not use on open wounds or open sores. Avoid contact with your eyes, ears, mouth and genitals (private parts). Wash Face and genitals (private parts)  with your normal soap.   6. Wash thoroughly, paying special attention to the area where your surgery will be performed.  7. Thoroughly rinse your body with warm water from the neck down.  8. DO NOT shower/wash with your normal soap after using and rinsing off the CHG Soap.  9. Pat yourself dry with a CLEAN TOWEL.  10. Wear CLEAN PAJAMAS to bed the night before surgery, wear comfortable clothes the morning of surgery  11. Place CLEAN SHEETS on your bed the night of your first shower and DO NOT SLEEP  WITH PETS.   Day of Surgery:   Do not apply any deodorants/lotions.  Please wear clean clothes to the hospital/surgery center.   Remember to brush your teeth WITH YOUR REGULAR TOOTHPASTE.   Please read over the following fact sheets that you were given.

## 2019-10-16 ENCOUNTER — Other Ambulatory Visit: Payer: Self-pay

## 2019-10-16 ENCOUNTER — Encounter: Payer: Self-pay | Admitting: Family Medicine

## 2019-10-16 ENCOUNTER — Encounter (HOSPITAL_COMMUNITY)
Admission: RE | Admit: 2019-10-16 | Discharge: 2019-10-16 | Disposition: A | Discharge status: Home / Self Care | Payer: Medicare Other | Source: Ambulatory Visit | Attending: Thoracic Surgery (Cardiothoracic Vascular Surgery) | Admitting: Thoracic Surgery (Cardiothoracic Vascular Surgery)

## 2019-10-16 ENCOUNTER — Encounter (HOSPITAL_COMMUNITY): Payer: Self-pay

## 2019-10-16 ENCOUNTER — Ambulatory Visit (HOSPITAL_COMMUNITY)
Admission: RE | Admit: 2019-10-16 | Discharge: 2019-10-16 | Disposition: A | Discharge status: Home / Self Care | Payer: Medicare Other | Source: Ambulatory Visit | Attending: Thoracic Surgery (Cardiothoracic Vascular Surgery) | Admitting: Thoracic Surgery (Cardiothoracic Vascular Surgery)

## 2019-10-16 ENCOUNTER — Other Ambulatory Visit (HOSPITAL_COMMUNITY)
Admission: RE | Admit: 2019-10-16 | Discharge: 2019-10-16 | Disposition: A | Discharge status: Home / Self Care | Payer: Medicare Other | Source: Ambulatory Visit | Attending: Thoracic Surgery (Cardiothoracic Vascular Surgery) | Admitting: Thoracic Surgery (Cardiothoracic Vascular Surgery)

## 2019-10-16 ENCOUNTER — Other Ambulatory Visit: Payer: Self-pay | Admitting: Family Medicine

## 2019-10-16 DIAGNOSIS — I340 Nonrheumatic mitral (valve) insufficiency: Secondary | ICD-10-CM

## 2019-10-16 DIAGNOSIS — Z20822 Contact with and (suspected) exposure to covid-19: Secondary | ICD-10-CM | POA: Diagnosis not present

## 2019-10-16 DIAGNOSIS — Z01818 Encounter for other preprocedural examination: Secondary | ICD-10-CM | POA: Diagnosis present

## 2019-10-16 DIAGNOSIS — I6523 Occlusion and stenosis of bilateral carotid arteries: Secondary | ICD-10-CM | POA: Diagnosis not present

## 2019-10-16 HISTORY — DX: Other complications of anesthesia, initial encounter: T88.59XA

## 2019-10-16 HISTORY — DX: Other specified postprocedural states: Z98.890

## 2019-10-16 HISTORY — DX: Nausea with vomiting, unspecified: R11.2

## 2019-10-16 LAB — TYPE AND SCREEN
ABO/RH(D): O POS
Antibody Screen: NEGATIVE

## 2019-10-16 LAB — CBC
HCT: 48.7 % (ref 39.0–52.0)
Hemoglobin: 16.7 g/dL (ref 13.0–17.0)
MCH: 33.9 pg (ref 26.0–34.0)
MCHC: 34.3 g/dL (ref 30.0–36.0)
MCV: 98.8 fL (ref 80.0–100.0)
Platelets: 213 10*3/uL (ref 150–400)
RBC: 4.93 MIL/uL (ref 4.22–5.81)
RDW: 12.4 % (ref 11.5–15.5)
WBC: 7.1 10*3/uL (ref 4.0–10.5)
nRBC: 0 % (ref 0.0–0.2)

## 2019-10-16 LAB — BLOOD GAS, ARTERIAL
Acid-Base Excess: 1.2 mmol/L (ref 0.0–2.0)
Bicarbonate: 25.2 mmol/L (ref 20.0–28.0)
FIO2: 21
O2 Saturation: 96.2 %
Patient temperature: 37
pCO2 arterial: 39.5 mmHg (ref 32.0–48.0)
pH, Arterial: 7.422 (ref 7.350–7.450)
pO2, Arterial: 82.1 mmHg — ABNORMAL LOW (ref 83.0–108.0)

## 2019-10-16 LAB — SURGICAL PCR SCREEN
MRSA, PCR: NEGATIVE
Staphylococcus aureus: NEGATIVE

## 2019-10-16 LAB — COMPREHENSIVE METABOLIC PANEL
ALT: 36 U/L (ref 0–44)
AST: 23 U/L (ref 15–41)
Albumin: 4.3 g/dL (ref 3.5–5.0)
Alkaline Phosphatase: 70 U/L (ref 38–126)
Anion gap: 12 (ref 5–15)
BUN: 18 mg/dL (ref 8–23)
CO2: 23 mmol/L (ref 22–32)
Calcium: 9.4 mg/dL (ref 8.9–10.3)
Chloride: 104 mmol/L (ref 98–111)
Creatinine, Ser: 0.91 mg/dL (ref 0.61–1.24)
GFR calc Af Amer: >60 mL/min (ref >60)
GFR calc non Af Amer: >60 mL/min (ref >60)
Glucose, Bld: 116 mg/dL — ABNORMAL HIGH (ref 70–99)
Potassium: 4.1 mmol/L (ref 3.5–5.1)
Sodium: 139 mmol/L (ref 135–145)
Total Bilirubin: 0.8 mg/dL (ref 0.3–1.2)
Total Protein: 7.8 g/dL (ref 6.5–8.1)

## 2019-10-16 LAB — URINALYSIS, ROUTINE W REFLEX MICROSCOPIC
Bilirubin Urine: NEGATIVE
Glucose, UA: NEGATIVE mg/dL
Hgb urine dipstick: NEGATIVE
Ketones, ur: NEGATIVE mg/dL
Leukocytes,Ua: NEGATIVE
Nitrite: NEGATIVE
Protein, ur: NEGATIVE mg/dL
Specific Gravity, Urine: 1.017 (ref 1.005–1.030)
pH: 6 (ref 5.0–8.0)

## 2019-10-16 LAB — PROTIME-INR
INR: 1 (ref 0.8–1.2)
Prothrombin Time: 12.3 seconds (ref 11.4–15.2)

## 2019-10-16 LAB — HEMOGLOBIN A1C
Hgb A1c MFr Bld: 5.8 % — ABNORMAL HIGH (ref 4.8–5.6)
Mean Plasma Glucose: 119.76 mg/dL

## 2019-10-16 LAB — ABO/RH: ABO/RH(D): O POS

## 2019-10-16 LAB — APTT: aPTT: 37 seconds — ABNORMAL HIGH (ref 24–36)

## 2019-10-16 LAB — SARS CORONAVIRUS 2 (TAT 6-24 HRS): SARS Coronavirus 2: NEGATIVE

## 2019-10-16 NOTE — Progress Notes (Addendum)
PCP - Dr. Orma Flaming [facility] Cardiologist - Dr. Rogers Seeds [facility] [address]  Chest x-ray - 10/16/19 EKG - 09/21/19 Stress Test - Yes at [facility] cannot remember date ECHO - 09/16/19 Cardiac Cath - 09/21/19 R & L   Sleep Study - Denies  COVID TEST- 10/16/19   Anesthesia review: Yes recent R & L heart cath  Patient denies shortness of breath, fever, cough and chest pain at PAT appointment   All instructions explained to the patient, with a verbal understanding of the material. Patient agrees to go over the instructions while at home for a better understanding. Patient also instructed to self quarantine after being tested for COVID-19. The opportunity to ask questions was provided.  Patient stated his doppler study was scheduled for Monday

## 2019-10-19 ENCOUNTER — Encounter: Payer: Self-pay | Admitting: Thoracic Surgery (Cardiothoracic Vascular Surgery)

## 2019-10-19 ENCOUNTER — Ambulatory Visit
Admission: RE | Admit: 2019-10-19 | Discharge: 2019-10-19 | Disposition: A | Discharge status: Home / Self Care | Payer: Medicare Other | Source: Ambulatory Visit | Attending: Thoracic Surgery (Cardiothoracic Vascular Surgery) | Admitting: Thoracic Surgery (Cardiothoracic Vascular Surgery)

## 2019-10-19 ENCOUNTER — Ambulatory Visit: Payer: Medicare Other | Admitting: Thoracic Surgery (Cardiothoracic Vascular Surgery)

## 2019-10-19 ENCOUNTER — Other Ambulatory Visit: Payer: Self-pay

## 2019-10-19 ENCOUNTER — Encounter (HOSPITAL_COMMUNITY): Payer: Self-pay | Admitting: Thoracic Surgery (Cardiothoracic Vascular Surgery)

## 2019-10-19 VITALS — BP 137/84 | HR 92 | Resp 18 | Ht 74.0 in | Wt 244.0 lb

## 2019-10-19 DIAGNOSIS — I340 Nonrheumatic mitral (valve) insufficiency: Secondary | ICD-10-CM

## 2019-10-19 MED ORDER — MILRINONE LACTATE IN DEXTROSE 20-5 MG/100ML-% IV SOLN
0.3000 ug/kg/min | INTRAVENOUS | Status: AC
  Administered 2019-10-20: .25 ug/kg/min via INTRAVENOUS
  Filled 2019-10-19: qty 100

## 2019-10-19 MED ORDER — TRANEXAMIC ACID 1000 MG/10ML IV SOLN
1.5000 mg/kg/h | INTRAVENOUS | Status: AC
  Administered 2019-10-20: 1.5 mg/kg/h via INTRAVENOUS
  Filled 2019-10-19 (×2): qty 25

## 2019-10-19 MED ORDER — VANCOMYCIN HCL 1000 MG IV SOLR
INTRAVENOUS | Status: DC
  Filled 2019-10-19: qty 1000

## 2019-10-19 MED ORDER — INSULIN REGULAR(HUMAN) IN NACL 100-0.9 UT/100ML-% IV SOLN
INTRAVENOUS | Status: AC
  Administered 2019-10-20: 1.6 [IU]/h via INTRAVENOUS
  Filled 2019-10-19: qty 100

## 2019-10-19 MED ORDER — POTASSIUM CHLORIDE 2 MEQ/ML IV SOLN
80.0000 meq | INTRAVENOUS | Status: DC
  Filled 2019-10-19: qty 40

## 2019-10-19 MED ORDER — SODIUM CHLORIDE 0.9 % IV SOLN
750.0000 mg | INTRAVENOUS | Status: AC
  Administered 2019-10-20: 750 mg via INTRAVENOUS
  Filled 2019-10-19: qty 750

## 2019-10-19 MED ORDER — EPINEPHRINE HCL 5 MG/250ML IV SOLN IN NS
0.0000 ug/min | INTRAVENOUS | Status: DC
  Filled 2019-10-19: qty 250

## 2019-10-19 MED ORDER — VANCOMYCIN HCL 1500 MG/300ML IV SOLN
1500.0000 mg | INTRAVENOUS | Status: AC
  Administered 2019-10-20: 1500 mg via INTRAVENOUS
  Filled 2019-10-19: qty 300

## 2019-10-19 MED ORDER — PLASMA-LYTE 148 IV SOLN
INTRAVENOUS | Status: DC
  Filled 2019-10-19: qty 2.5

## 2019-10-19 MED ORDER — NOREPINEPHRINE 4 MG/250ML-% IV SOLN
0.0000 ug/min | INTRAVENOUS | Status: DC
  Filled 2019-10-19: qty 250

## 2019-10-19 MED ORDER — TRANEXAMIC ACID (OHS) BOLUS VIA INFUSION
15.0000 mg/kg | INTRAVENOUS | Status: AC
  Administered 2019-10-20: 1702.5 mg via INTRAVENOUS
  Filled 2019-10-19: qty 1703

## 2019-10-19 MED ORDER — SODIUM CHLORIDE 0.9 % IV SOLN
1.5000 g | INTRAVENOUS | Status: AC
  Administered 2019-10-20: 1.5 g via INTRAVENOUS
  Filled 2019-10-19 (×2): qty 1.5

## 2019-10-19 MED ORDER — TRANEXAMIC ACID (OHS) PUMP PRIME SOLUTION
2.0000 mg/kg | INTRAVENOUS | Status: DC
  Filled 2019-10-19: qty 2.27

## 2019-10-19 MED ORDER — MANNITOL 20 % IV SOLN
INTRAVENOUS | Status: DC
  Filled 2019-10-19: qty 13

## 2019-10-19 MED ORDER — IOPAMIDOL (ISOVUE-370) INJECTION 76%
75.0000 mL | Freq: Once | INTRAVENOUS | Status: AC | PRN
  Administered 2019-10-19: 75 mL via INTRAVENOUS

## 2019-10-19 MED ORDER — SODIUM CHLORIDE 0.9 % IV SOLN
INTRAVENOUS | Status: DC
  Filled 2019-10-19: qty 30

## 2019-10-19 MED ORDER — NITROGLYCERIN IN D5W 200-5 MCG/ML-% IV SOLN
2.0000 ug/min | INTRAVENOUS | Status: DC
  Filled 2019-10-19: qty 250

## 2019-10-19 MED ORDER — PHENYLEPHRINE HCL-NACL 20-0.9 MG/250ML-% IV SOLN
30.0000 ug/min | INTRAVENOUS | Status: AC
  Administered 2019-10-20: 25 ug/min via INTRAVENOUS
  Filled 2019-10-19: qty 250

## 2019-10-19 MED ORDER — DEXMEDETOMIDINE HCL IN NACL 400 MCG/100ML IV SOLN
0.1000 ug/kg/h | INTRAVENOUS | Status: AC
  Administered 2019-10-20: .7 ug/kg/h via INTRAVENOUS
  Filled 2019-10-19: qty 100

## 2019-10-19 MED ORDER — GLUTARALDEHYDE 0.625% SOAKING SOLUTION
TOPICAL | Status: DC
  Filled 2019-10-19: qty 50

## 2019-10-19 NOTE — H&P (Signed)
[name]       [place] [address]             [phone]          CARDIOTHORACIC SURGERY HISTORY AND PHYSICAL EXAM  Primary Cardiologist is Oscar Field, MD PCP is Oscar Flaming, MD  Chief Complaint  Patient presents with  . Mitral Regurgitation    new patient consultation, CATH/ TEE 09/21/19   HPI:  Patient is a 64 year old male with no previous cardiac history referred for surgical consultation to discuss treatment options for management of recently diagnosed mitral valve prolapse with severe symptomatic primary mitral regurgitation.  Patient states that he was just recently noted to have a heart murmur on physical exam by his primary care physician.  Transthoracic echocardiogram was performed demonstrating mitral valve prolapse with at least moderate to severe mitral regurgitation.  He was referred for cardiology consultation and evaluated by Dr. Davina [name].  TEE confirmed the presence of mitral valve prolapse with a large flail segment involving the P2 segment of the posterior leaflet causing severe mitral regurgitation.  Diagnostic cardiac catheterization was performed and notable for the absence of significant coronary artery disease with normal right-sided pressures.  The patient was referred for surgical consultation.  Patient is married and [place] locally [place] near [place].  He has been retired for approximately 1-1/2 years having previously worked for [employer] for many years.  He has remained physically active throughout his adult life and into retirement.  The patient and his wife live on a large parcel of land and he spends a great deal of time working outdoors.  He enjoys hunting and golf.  He has noticed a tendency to get short of breath and tired with exertion.  Breathing has gotten considerably worse over the last few months.  He initially attributed this to presumed asthma and has sought relief by sticking his head into the freezer  to inhale cold air.  He has some tightness across his chest when he feels short of breath.  He has not had palpitations, dizzy spells, nor syncope.  He gets short of breath with moderate level activity.  He denies orthopnea or PND.  He reports increasing persistent dry nonproductive cough.  Patient was originally seen in consultation on Sep 25, 2019 at which time we made plans for elective mitral valve repair scheduled for tomorrow.  The patient reports no new problems or complaints over the last few weeks.  He does admit that his symptoms seem to be progressing and that he has been getting short of breath more more often with physical activity.  He continues to deny resting shortness of breath.  He has not had PND, orthopnea, or lower extremity edema.  He has a slight cough.  He has not had fevers or chills.  He otherwise remains well.  Past Medical History:  Diagnosis Date  . Allergy   . Cataract   . CHF (congestive heart failure) ([facility] [place]) 09/2019  . Chronic kidney disease    OQ:HUTMLY  . Complication of anesthesia   . Hyperlipidemia   . Insomnia   . Mitral regurgitation   . PONV (postoperative nausea and vomiting)     Past Surgical History:  Procedure Laterality Date  . BUBBLE STUDY  09/16/2019   Procedure: BUBBLE STUDY;  Surgeon: Geralynn Rile, MD;  Location: [facility];  Service: Cardiovascular;;  . CARDIAC CATHETERIZATION Bilateral 09/21/2019  . FOOT SURGERY     right  .  RIGHT/LEFT HEART CATH AND CORONARY ANGIOGRAPHY N/A 09/21/2019   Procedure: RIGHT/LEFT HEART CATH AND CORONARY ANGIOGRAPHY;  Surgeon: Nelva Bush, MD;  Location: [facility];  Service: Cardiovascular;  Laterality: N/A;  . SHOULDER ARTHROSCOPY    . TEE WITHOUT CARDIOVERSION N/A 09/16/2019   Procedure: TRANSESOPHAGEAL ECHOCARDIOGRAM (TEE);  Surgeon: Geralynn Rile, MD;  Location: [facility];  Service: Cardiovascular;  Laterality: N/A;  . thumb surgery     right  . TONSILLECTOMY    .  VASECTOMY      Family History  Problem Relation Age of Onset  . Cancer Mother   . Cancer Brother   . Cancer Father   . Heart disease Father   . Arthritis Maternal Grandmother   . Arthritis Maternal Grandfather   . Heart disease Maternal Grandfather   . Heart attack Maternal Grandfather   . Colon cancer Neg Hx   . Esophageal cancer Neg Hx   . Stomach cancer Neg Hx     Social History Social History   Tobacco Use  . Smoking status: Never Smoker  . Smokeless tobacco: Never Used  Vaping Use  . Vaping Use: Never used  Substance Use Topics  . Alcohol use: Yes    Alcohol/week: 3.0 standard drinks    Types: 3 Cans of beer per week  . Drug use: No    Prior to Admission medications   Medication Sig Start Date End Date Taking? Authorizing Provider  ibuprofen (ADVIL) 200 MG tablet Take 400 mg by mouth every 6 (six) hours as needed for moderate pain.   Yes [provider]  Multiple Vitamin (MULTIVITAMIN) tablet Take 1 tablet by mouth daily.   Yes [provider]  rosuvastatin (CRESTOR) 20 MG tablet Take 1 tablet (20 mg total) by mouth daily. 07/23/19 10/21/19 Yes O'Neal, Cassie Freer, MD  Saw Palmetto, Serenoa repens, (SAW PALMETTO PO) Take 1 tablet by mouth daily.     Yes [provider]  traZODone (DESYREL) 100 MG tablet Take one pill at night for sleep Patient taking differently: Take 100 mg by mouth at bedtime.  09/28/19  Yes Oscar Flaming, MD  clonazePAM (KLONOPIN) 1 MG tablet TAKE 1 TABLET BY MOUTH  DAILY AT BEDTIME 10/17/19   Oscar Flaming, MD  fluticasone [facility]) 50 MCG/ACT nasal spray Place 2 sprays into the nose 2 (two) times daily. 02/26/12   Leandrew Koyanagi, MD    Allergies  Allergen Reactions  . Poison Ivy Treatments [Poison Ivy Treatments]     Allergic to poison ivy  . Pollen Extract      Review of Systems:              General:                      normal appetite, decreased energy, no weight gain, no weight loss, no fever              Cardiac:                       no chest pain with exertion, occasional chest pain at rest, +SOB with exertion, occasional resting SOB, no PND, no orthopnea, no palpitations, no arrhythmia, no atrial fibrillation, no LE edema, + dizzy spells, no syncope             Respiratory:                 + shortness of breath, no home oxygen,  no productive cough, + dry cough, no bronchitis, no wheezing, no hemoptysis, no asthma, no pain with inspiration or cough, possible sleep apnea, no CPAP at night             GI:                               no difficulty swallowing, no reflux, + frequent heartburn, no hiatal hernia, no abdominal pain, no constipation, no diarrhea, no hematochezia, no hematemesis, no melena             GU:                              no dysuria,  + frequency, no urinary tract infection, no hematuria, + enlarged prostate,  kidney stones, no kidney disease             Vascular:                     no pain suggestive of claudication, no pain in feet, no leg cramps, no varicose veins, no DVT, no non-healing foot ulcer             Neuro:                         no stroke, no TIA's, no seizures, occasional headaches, no temporary blindness one eye,  no slurred speech, no peripheral neuropathy, no chronic pain, no instability of gait, slight memory/cognitive dysfunction             Musculoskeletal:         no arthritis, no joint swelling, occasional myalgias, no difficulty walking, normal mobility              Skin:                            no rash, no itching, no skin infections, no pressure sores or ulcerations             Psych:                         + anxiety, no depression, no nervousness, no unusual recent stress             Eyes:                           no blurry vision, + floaters, no recent vision changes, + wears glasses or contacts             ENT:                            + hearing loss, no loose or painful teeth, no dentures, last saw dentist April 2021              Hematologic:               no easy bruising, no abnormal bleeding, no clotting disorder, no frequent epistaxis             Endocrine:                   no diabetes, does not check CBG's at home  Physical Exam:              BP (!) 153/81 (BP Location: Left Arm, Patient Position: Sitting, Cuff Size: Normal)   Pulse 89   Temp (!) 97.3 F (36.3 C)   Resp 18   Ht 6\' 2"  (1.88 m)   Wt 245 lb (111.1 kg)   SpO2 95% Comment: RA  BMI 31.46 kg/m              General:                      Mildly obese,  well-appearing             HEENT:                       Unremarkable              Neck:                           no JVD, no bruits, no adenopathy              Chest:                          clear to auscultation, symmetrical breath sounds, no wheezes, no rhonchi              CV:                              RRR, grade IV/VI holosystolic murmur              Abdomen:                    soft, non-tender, no masses              Extremities:                 warm, well-perfused, pulses palpable, no LE edema             Rectal/GU                   Deferred             Neuro:                         Grossly non-focal and symmetrical throughout             Skin:                            Clean and dry, no rashes, no breakdown   Diagnostic Tests:  ECHOCARDIOGRAM REPORT       Patient Name:  Oscar [name] Date of Exam: 04/10/2019  Medical Rec #: [id number]     Height:    74.0 in  Accession #:  [id number]    Weight:    252.0 lb  Date of Birth: Aug 27, 1955     BSA:     2.40 m  Patient Age:  36 years     BP:      140/78 mmHg  Patient Gender: M         HR:      84 bpm.  Exam Location: [address]   Procedure: 2D Echo, Color Doppler and  Cardiac Doppler   Indications:  R01.1 Murmur    History:    Patient has no prior history of Echocardiogram  examinations.         Risk Factors:Dyslipidemia.      Sonographer:  Jessee Avers, RDCS  Referring Phys: [id number] [place]    1. The mitral valve is abnormal. Prolapse of the posterior leaflet. There  is eccentric anterior directed jet. Appears to be moderate mitral  regurgitation, though could be underestimating given eccentric jet. Normal  left atrial size and E wave <1.2  argues against severe MR, though does have mild LV dilatation of unclear  cause. Comparing MV annular inflow to LVOT SV, the regurgitant volume is  50cc (RF 34%), suggestive of moderate MR. If clinical suspicion for severe  MR, consider cardiac MRI or TEE  for further evaluation.  2. Left ventricular ejection fraction, by visual estimation, is 60 to  65%. The left ventricle has normal function. There is no left ventricular  hypertrophy.  3. Mildly dilated left ventricular internal cavity size.  4. Global right ventricle has normal systolic function.The right  ventricular size is normal. No increase in right ventricular wall  thickness.  5. Left atrial size was normal.  6. Right atrial size was normal.  7. The tricuspid valve is normal in structure. Tricuspid valve  regurgitation is not demonstrated.  8. The aortic valve is tricuspid. Aortic valve regurgitation is not  visualized.  9. The pulmonic valve was not well visualized. Pulmonic valve  regurgitation is not visualized.  10. There is mild dilatation of the ascending aorta measuring 36 mm.  11. TR signal is inadequate for assessing pulmonary artery systolic  pressure.   FINDINGS  Left Ventricle: Left ventricular ejection fraction, by visual estimation,  is 60 to 65%. The left ventricle has normal function. The left ventricular  internal cavity size was mildly dilated left ventricle. There is no left  ventricular hypertrophy. Left  ventricular diastolic parameters were normal.   Right Ventricle: The right ventricular size is normal. No increase in  right ventricular  wall thickness. Global RV systolic function is has  normal systolic function.   Left Atrium: Left atrial size was normal in size.   Right Atrium: Right atrial size was normal in size   Pericardium: There is no evidence of pericardial effusion.   Mitral Valve: The mitral valve is abnormal. Moderate mitral valve  regurgitation, with eccentric anteriorly directed jet.   Tricuspid Valve: The tricuspid valve is normal in structure. Tricuspid  valve regurgitation is not demonstrated.   Aortic Valve: The aortic valve is tricuspid. Aortic valve regurgitation is  not visualized.   Pulmonic Valve: The pulmonic valve was not well visualized. Pulmonic valve  regurgitation is not visualized.   Aorta: Aortic dilatation noted. There is mild dilatation of the ascending  aorta measuring 36 mm.   IAS/Shunts: The interatrial septum was not well visualized.     LEFT VENTRICLE  PLAX 2D  LVIDd:     5.90 cm Diastology  LVIDs:     3.90 cm LV e' lateral:  15.60 cm/s  LV PW:     1.00 cm LV E/e' lateral: 6.8  LV IVS:    0.80 cm LV e' medial:  9.32 cm/s  LVOT diam:   2.60 cm LV E/e' medial: 11.4  LV SV:     107 ml  LV SV Index:  43.38  LVOT Area:   5.31 cm     RIGHT  VENTRICLE  RV Basal diam: 3.00 cm  RV S prime:   6.57 cm/s   LEFT ATRIUM       Index    RIGHT ATRIUM      Index  LA diam:    4.50 cm 1.88 cm/m RA Area:   12.00 cm  LA Vol (A2C):  47.6 ml 19.85 ml/m RA Volume:  28.90 ml 12.05 ml/m  LA Vol (A4C):  48.0 ml 20.02 ml/m  LA Biplane Vol: 48.0 ml 20.02 ml/m  AORTIC VALVE  LVOT Vmax:  108.00 cm/s  LVOT Vmean: 74.800 cm/s  LVOT VTI:  0.186 m    AORTA  Ao Root diam: 3.20 cm  Ao Asc diam: 3.60 cm   MITRAL VALVE                    SHUNTS                    Systemic VTI: 0.19 m  MV Decel Time: 155 msec       Systemic Diam: 2.60 cm  MV E velocity: 106.00 cm/s 103  cm/s  MV A velocity: 63.50 cm/s 70.3 cm/s  MV E/A ratio: 1.67    1.5     Oscar Milian MD  Electronically signed by Oscar Milian MD  Signature Date/Time: 04/10/2019/6:49:02 PM      TRANSESOPHOGEAL ECHO REPORT       Patient Name:  Oscar [name] Date of Exam: 09/16/2019  Medical Rec #: [id number]     Height:    74.0 in  Accession #:  [id number]    Weight:    247.0 lb  Date of Birth: 1955/09/11     BSA:     2.377 m  Patient Age:  74 years     BP:      137/110 mmHg  Patient Gender: M         HR:      78 bpm.  Exam Location: Outpatient   Procedure: 3D Echo, Transesophageal Echo, Cardiac Doppler, Color Doppler  and       Saline Contrast Bubble Study   Indications:  mitral regurgitation    History:    Patient has prior history of Echocardiogram examinations,  most         recent 04/10/2019.    Sonographer:  Johny Chess  Referring Phys: [id number] [place]   PROCEDURE: After discussion of the risks and benefits of a TEE, an  informed consent was obtained from the patient. TEE procedure time was 25  minutes. The transesophogeal probe was passed without difficulty through  the esophogus of the patient. Local  oropharyngeal anesthetic was provided with viscous lidocaine. Sedation  performed by different physician. The patient was monitored while under  deep sedation. Anesthestetic sedation was provided intravenously by  Anesthesiology: 527mg  of Propofol. Image  quality was excellent. The patient's vital signs; including heart rate,  blood pressure, and oxygen saturation; remained stable throughout the  procedure. The patient developed no complications during the procedure.   IMPRESSIONS    1. There is severe prolapse of the P2 segment with a fail chord. There is  moderate P3 prolapse. There is severe MR with an eccentric jet directed  anteriorly with  coanda effect. There is systolic pulmonary vein flow  reversal in the left upper pulmonary  vein. 2D PISA radius is 1.3 cm, 2D ERO 0.74 cm2, R vol 121 cc. 3D ERO 0.75  cm2. Flail width 14 mm. Flail gap  5.2 mm. PMVL length 19 mm. Overall,  findings consistent with severe mitral regurgitation. The mitral valve is  myxomatous. Severe mitral valve  regurgitation. No evidence of mitral stenosis.  2. Left ventricular ejection fraction, by estimation, is 65 to 70%. Left  ventricular ejection fraction by 3D volume is 71 %. The left ventricle has  normal function. The left ventricle has no regional wall motion  abnormalities.  3. Right ventricular systolic function is normal. The right ventricular  size is normal.  4. Left atrial size was mildly dilated. No left atrial/left atrial  appendage thrombus was detected. The LAA emptying velocity was 65 cm/s.  5. There appears to be a flail cord of the septal leflet. There is mild  TR associated with this. The tricuspid valve is myxomatous.  6. The aortic valve is tricuspid. Aortic valve regurgitation is not  visualized. No aortic stenosis is present.  7. There is mild (Grade II) layered plaque involving the ascending aorta,  transverse aorta and descending aorta.  8. Agitated saline contrast bubble study was negative, with no evidence  of any interatrial shunt.   FINDINGS  Left Ventricle: Left ventricular ejection fraction, by estimation, is 65  to 70%. Left ventricular ejection fraction by 3D volume is 71 %. The left  ventricle has normal function. The left ventricle has no regional wall  motion abnormalities. The left  ventricular internal cavity size was normal in size. There is no left  ventricular hypertrophy.   Right Ventricle: The right ventricular size is normal. No increase in  right ventricular wall thickness. Right ventricular systolic function is  normal.   Left Atrium: Left atrial size was mildly dilated. No left atrial/left   atrial appendage thrombus was detected. The LAA emptying velocity was 65  cm/s.   Right Atrium: Right atrial size was normal in size.   Pericardium: There is no evidence of pericardial effusion.   Mitral Valve: There is severe prolapse of the P2 segment with a fail  chord. There is moderate P3 prolapse. There is severe MR with an eccentric  jet directed anteriorly with coanda effect. There is systolic pulmonary  vein flow reversal in the left upper  pulmonary vein. 2D PISA radius is 1.3 cm, 2D ERO 0.74 cm2, R vol 121 cc.  3D ERO 0.75 cm2. Flail width 14 mm. Flail gap 5.2 mm. PMVL length 19 mm.  Overall, findings consistent with severe mitral regurgitation. The mitral  valve is myxomatous. Severe mitral  valve regurgitation, with anteriorly-directed jet. No evidence of mitral  valve stenosis.   Tricuspid Valve: There appears to be a flail cord of the septal leflet.  There is mild TR associated with this. The tricuspid valve is myxomatous.  Tricuspid valve regurgitation is mild . No evidence of tricuspid stenosis.   Aortic Valve: The aortic valve is tricuspid. Aortic valve regurgitation is  not visualized. No aortic stenosis is present.   Pulmonic Valve: The pulmonic valve was normal in structure. Pulmonic valve  regurgitation is not visualized. No evidence of pulmonic stenosis.   Aorta: The aortic root and ascending aorta are structurally normal, with  no evidence of dilitation. There is mild (Grade II) layered plaque  involving the ascending aorta, transverse aorta and descending aorta.   Venous: The left upper pulmonary vein is normal. A pattern of systolic  flow reversal, suggestive of severe mitral regurgitation is recorded from  the left upper pulmonary vein.   IAS/Shunts: No atrial level shunt detected by color flow Doppler. Agitated  saline contrast was given intravenously to evaluate for intracardiac  shunting. Agitated saline contrast bubble study was negative, with  no  evidence of any interatrial shunt.       3D Volume EF  LV 3D EF:  Left ventricular ejection fraction by 3D volume is 71        %.    3D Volume EF  LV 3D EF:  71.30 %  LV 3D EDV:  139100.00 mm  LV 3D ESV:  39900.00 mm  LV 3D SV:  99300.00 mm    3D Volume EF:  3D EF:    71 %     AORTA  Ao Root diam: 3.10 cm  Ao Asc diam: 3.09 cm   MR Peak grad:  121.9 mmHg TRICUSPID VALVE  MR Mean grad:  87.0 mmHg  TR Peak grad:  24.0 mmHg  MR Vmax:     552.00 cm/s TR Vmax:    245.00 cm/s  MR Vmean:    444.0 cm/s  MR PISA:     10.62 cm  MR PISA Eff ROA: 74 mm  MR PISA Radius: 1.30 cm   Oscar Chiquito MD  Electronically signed by Oscar Chiquito MD  Signature Date/Time: 09/16/2019/1:09:30 PM      RIGHT/LEFT HEART CATH AND CORONARY ANGIOGRAPHY  Conclusion  Conclusions: 1. Mild ostial D1 narrowing (~20%). Otherwise, no angiographically significant coronary artery disease. 2. Upper normal to mildly elevated left and right heart filling pressures. 3. Low normal Fick cardiac output/index.  Recommendations: 1. Ongoing management of mitral regurgitation per Drs. O'Neal and Roxy Manns. 2. Primary prevention of coronary artery disease.  Nelva Bush, MD [facility]  Recommendations  Antiplatelet/Anticoag No indication for antiplatelet therapy at this time .  Discharge Date In the absence of any other complications or medical issues, we expect the patient to be ready for discharge on 09/21/2019.  Surgeon Notes    09/21/2019 9:40 PM Brief Op Note addendum by Nelva Bush, MD    09/16/2019 9:48 AM CV Procedure signed by Geralynn Rile, MD  Indications  Nonrheumatic mitral valve regurgitation [I34.0 (ICD-10-CM)]  Procedural Details  Technical Details Indication: 64 y.o. year-old man with history of hypertension and severe mitral regurgitation with flair P2 segment, presenting for preoperative evaluation in  anticipation of mitral valve repair.  GFR: >60 ml/min  Procedure: The risks, benefits, complications, treatment options, and expected outcomes were discussed with the patient. The patient and/or family concurred with the proposed plan, giving informed consent. The patient was sedated with IV midazolam and fentanyl. The right wrist was assessed with a modified Allens test which was normal. The right wrist and elbow were prepped and draped in a sterile fashion. 1% lidocaine was used for local anesthesia. A previously placed antecubital vein IV was exchanged for a 49F slender Glidesheath using modified Seldinger technique. Right heart catheterization was performed by advancing a 49F balloon-tipped catheter through the right heart chambers into the pulmonary capillary wedge position. Pressure measurements and oxygen saturations were obtained.  Using the modified Seldinger access technique, a 631F slender Glidesheath was placed in the right radial artery. 3 mg Verapamil was given through the sheath. Heparin 5,000 units were administered.  Selective coronary angiography was performed using 49F JL3.5 and AR2 catheters to engage the left and right coronary arteries, respectively. Left heart catheterization was performed using a 49F JR4 catheter. Left ventriculogram was not performed.  At the end of the procedure, the radial artery sheath was removed and a TR band applied to achieve  patent hemostasis. There were no immediate complications. The patient was taken to the recovery area in stable condition.  Estimated blood loss <50 mL.   During this procedure medications were administered to achieve and maintain moderate conscious sedation while the patient's heart rate, blood pressure, and oxygen saturation were continuously monitored and I was present face-to-face 100% of this time.  Medications (Filter: Administrations occurring from 09/21/19 1015 to 09/21/19 1114) Heparin (Porcine) in NaCl 1000-0.9 UT/500ML-% SOLN  (mL) Total volume:  1,000 mL Date/Time  Rate/Dose/Volume Action  09/21/19 1031  500 mL Given  1032  500 mL Given    fentaNYL (SUBLIMAZE) injection (mcg) Total dose:  50 mcg Date/Time  Rate/Dose/Volume Action  09/21/19 1033  25 mcg Given  1045  25 mcg Given    midazolam (VERSED) injection (mg) Total dose:  2 mg Date/Time  Rate/Dose/Volume Action  09/21/19 1033  1 mg Given  1045  1 mg Given    lidocaine (PF) (XYLOCAINE) 1 % injection (mL) Total volume:  4 mL Date/Time  Rate/Dose/Volume Action  09/21/19 1041  2 mL Given  1045  2 mL Given    Radial Cocktail/Verapamil only (mL) Total volume:  10 mL Date/Time  Rate/Dose/Volume Action  09/21/19 1050  10 mL Given    heparin sodium (porcine) injection (Units) Total dose:  5,000 Units Date/Time  Rate/Dose/Volume Action  09/21/19 1053  5,000 Units Given    iohexol (OMNIPAQUE) 350 MG/ML injection (mL) Total volume:  75 mL Date/Time  Rate/Dose/Volume Action  09/21/19 1111  75 mL Given    Sedation Time  Sedation Time Physician-1: 35 minutes 50 seconds  Contrast  Medication Name Total Dose  iohexol (OMNIPAQUE) 350 MG/ML injection 75 mL    Radiation/Fluoro  Fluoro time: 5.7 (min) DAP: 32075 (mGycm2) Cumulative Air Kerma: 283 (mGy)  Complications  Complications documented before study signed (09/21/2019 1:51 PM)   No complications were associated with this study.  Documented by Nelva Bush, MD - 09/21/2019 9:37 PM    Coronary Findings  Diagnostic Dominance: Right Left Main  Vessel is large. Vessel is angiographically normal.  Left Anterior Descending  First Diagonal Branch  Vessel is moderate in size.  1st Diag lesion 20% stenosed  1st Diag lesion is 20% stenosed.  Second Diagonal Branch  Vessel is small in size.  Left Circumflex  Vessel is large. Vessel is angiographically normal.  First Obtuse Marginal Branch  Vessel is small in size.  Second Obtuse Marginal Branch  Vessel is  large in size.  Third Obtuse Marginal Branch  Vessel is small in size.  Right Coronary Artery  Vessel is large. Vessel is angiographically normal.  Right Posterior Descending Artery  Vessel is small in size.  Right Posterior Atrioventricular Artery  Vessel is moderate in size.  Intervention  No interventions have been documented. Right Heart  Right Heart Pressures RA (mean): 5 mmHg RV (S/EDP): 30/7 mmHg PA (S/D, mean): 30/15 (20) mmHg PCWP (mean): 15 mmHg  Ao sat: 98% PA sat: 72%  Fick CO: 5.6 L/min Fick CI: 2.4 L/min/m^2  Left Heart  Left Ventricle LV end diastolic pressure is mildly elevated. LVEDP 15-20 mmHg.  Aortic Valve There is no aortic valve stenosis.  Coronary Diagrams  Diagnostic Dominance: Right  Intervention  Implants      No implant documentation for this case.  Syngo Images  Show images for CARDIAC CATHETERIZATION  Images on Long Term Storage  Show images for Tabora, Ariq Khamis "Richardson Landry"   Link to Procedure Log  Procedure Log    Hemo Data   Most Recent Value  Fick Cardiac Output 5.64 L/min  Fick Cardiac Output Index 2.37 (L/min)/BSA  RA A Wave 6 mmHg  RA V Wave 4 mmHg  RA Mean 3 mmHg  RV Systolic Pressure 25 mmHg  RV Diastolic Pressure 2 mmHg  RV EDP 7 mmHg  PA Systolic Pressure 26 mmHg  PA Diastolic Pressure 11 mmHg  PA Mean 19 mmHg  PW A Wave 15 mmHg  PW V Wave 21 mmHg  PW Mean 11 mmHg  LV Systolic Pressure 97 mmHg  LV Diastolic Pressure 4 mmHg  LV EDP 16 mmHg  AOp Systolic Pressure 87 mmHg  AOp Diastolic Pressure 54 mmHg  AOp Mean Pressure 70 mmHg  LVp Systolic Pressure 95 mmHg  LVp Diastolic Pressure 4 mmHg  LVp EDP Pressure 16 mmHg  QP/QS 1  TPVR Index 8.04 HRUI    CT ANGIOGRAPHY CHEST, ABDOMEN AND PELVIS  TECHNIQUE: Multidetector CT imaging through the chest, abdomen and pelvis was performed using the standard protocol during bolus administration of intravenous contrast. Multiplanar reconstructed  images and MIPs were obtained and reviewed to evaluate the vascular anatomy.  CONTRAST: 53mL ISOVUE-370 IOPAMIDOL (ISOVUE-370) INJECTION 76%  COMPARISON: None.  FINDINGS: CTA CHEST FINDINGS  Cardiovascular: Heart size normal. No pericardial effusion. Borderline dilated central pulmonary arteries. Satisfactory opacification of pulmonary arteries noted, and there is no evidence of pulmonary emboli. Adequate contrast opacification of the thoracic aorta with no evidence of dissection, aneurysm, or stenosis. There is classic 3-vessel brachiocephalic arch anatomy without proximal stenosis. No significant atheromatous change.  Mediastinum/Nodes: No hilar or mediastinal adenopathy.  Lungs/Pleura: No pleural effusion. No pneumothorax. Lungs are clear.  Musculoskeletal: Anterior vertebral endplate spurring at multiple levels in the lower thoracic spine.  Review of the MIP images confirms the above findings.  CTA ABDOMEN AND PELVIS FINDINGS  VASCULAR  Aorta: Minimal atheromatous plaque. No aneurysm, dissection, or stenosis.  Celiac: Patent without evidence of aneurysm, dissection, vasculitis or significant stenosis.  SMA: Patent without evidence of aneurysm, dissection, vasculitis or significant stenosis.  Renals: Duplicated left, inferior dominant, both patent. Duplicated right, superior dominant, both patent.  IMA: Patent without evidence of aneurysm, dissection, vasculitis or significant stenosis.  Inflow: Mild tortuosity. No aneurysm, dissection, or stenosis. Visualized proximal outflow patent.  Veins: Patent hepatic veins, portal vein, SMV, splenic vein, bilateral renal veins. No venous pathology identified.  Review of the MIP images confirms the above findings.  NON-VASCULAR  Hepatobiliary: Fatty liver. No focal lesion or biliary ductal dilatation. Gallbladder nondistended, unremarkable.  Pancreas: Unremarkable. No pancreatic ductal  dilatation or surrounding inflammatory changes.  Spleen: Normal in size without focal abnormality.  Adrenals/Urinary Tract: Adrenal glands unremarkable. Bilateral nephrolithiasis, largest in the mid and lower left collecting system 8 mm. 3 mm calculus, right lower pole collecting system. 2.1 cm cyst from the upper pole left kidney. No hydronephrosis. Bladder physiologically distended containing 7 and 2 mm calculi.  Stomach/Bowel: Stomach is incompletely distended. Prominent proximal duodenal diverticulum. Small bowel is decompressed. Normal appendix. Colon is nondilated, unremarkable.  Lymphatic: No abdominal or pelvic adenopathy.  Reproductive: Prostate is unremarkable.  Other: No ascites. No free air.  Musculoskeletal: Small umbilical hernia containing only mesenteric fat. Mild spondylitic changes in the lower lumbar spine.  Review of the MIP images confirms the above findings.  IMPRESSION: 1. Fatty liver. 2. Bilateral nephrolithiasis and bladder calculi as above. No hydronephrosis. 3. Small umbilical hernia containing only mesenteric fat.  Aortic Atherosclerosis (ICD10-I70.0).  Electronically Signed By: Lucrezia Europe M.D. On: 10/19/2019 15:39   Impression:  Patient has mitral valve prolapse with stage D severe symptomatic primary mitral regurgitation. He reports progressive symptoms of exertional shortness of breath and fatigue consistent with chronic diastolic congestive heart failure, New York Heart Association functional class IIb. Symptoms seem to be accelerating and occur with less and less activity. I have personally reviewed the patient's recent transthoracic and transesophageal echocardiograms, diagnostic cardiac catheterization, and CT angiograms. 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 Left ventricular systolic function remains preserved. Diagnostic cardiac catheterization is notable for the absence of significant coronary artery disease and normal right-sided pressures.  CT angiograms revealed no contraindications to peripheral cannulation for surgery.   Plan:  The patient wasand his wife were againcounseled regarding the indications, risks and potential benefits of mitral valve repair. The rationale for elective surgery has been explained, including a comparison between surgery and continued medical therapy with close follow-up. The likelihood of successful and durable mitral valve repair has been discussed with particular reference to the findings of their recent echocardiogram. Based upon these findings and previous experience, I have quoted them a greater than 95percent likelihood of successful valve repair with less than 1percent risk of mortality or major morbidity. Alternative surgical approaches have been discussed including a comparison between conventional sternotomy and minimally-invasive techniques. The relative risks and benefits of each have been reviewed as they pertain to the patient's specific circumstances, and expectations for the patient's postoperative convalescence has been discussed.   The patient understands and accepts all potential risks of surgery including but not limited to risk of death, stroke or other neurologic complication, myocardial infarction, congestive heart failure, respiratory failure, renal failure, bleeding requiring transfusion and/or reexploration, arrhythmia, infection or other wound complications, pneumonia, pleural and/or pericardial effusion, pulmonary embolus, aortic dissection or other major vascular complication, or delayed complications related to valve repair or replacement including but not limited to structural valve deterioration and  failure, thrombosis, embolization, endocarditis, or paravalvular leak.  Specific risks potentially related to the minimally-invasive approach were discussed at length, including but not limited to risk of conversion to full or partial sternotomy, aortic dissection or other major vascular complication, unilateral acute lung injury or pulmonary edema, phrenic nerve dysfunction or paralysis, rib fracture, chronic pain, lung hernia, or lymphocele. All of their questions have been answered.    Valentina Gu. Roxy Manns, MD 10/19/2019 4:29 PM

## 2019-10-19 NOTE — Anesthesia Preprocedure Evaluation (Addendum)
Anesthesia Evaluation  Patient identified by MRN, date of birth, ID band Patient awake    Reviewed: Allergy & Precautions, H&P , NPO status , Patient's Chart, lab work & pertinent test results  History of Anesthesia Complications (+) PONV  Airway Mallampati: III  TM Distance: >3 FB Neck ROM: Full    Dental no notable dental hx. (+) Teeth Intact, Dental Advisory Given   Pulmonary neg pulmonary ROS,    Pulmonary exam normal breath sounds clear to auscultation       Cardiovascular Exercise Tolerance: Good +CHF  + Valvular Problems/Murmurs MR  Rhythm:Regular Rate:Normal     Neuro/Psych negative neurological ROS  negative psych ROS   GI/Hepatic negative GI ROS, Neg liver ROS,   Endo/Other  negative endocrine ROS  Renal/GU negative Renal ROS  negative genitourinary   Musculoskeletal   Abdominal   Peds  Hematology negative hematology ROS (+)   Anesthesia Other Findings   Reproductive/Obstetrics negative OB ROS                            Anesthesia Physical Anesthesia Plan  ASA: III  Anesthesia Plan: General   Post-op Pain Management:    Induction: Intravenous  PONV Risk Score and Plan: 3 and Ondansetron, Dexamethasone and Midazolam  Airway Management Planned: Double Lumen EBT  Additional Equipment: Arterial line, CVP, PA Cath, TEE, 3D TEE and Ultrasound Guidance Line Placement  Intra-op Plan:   Post-operative Plan: Extubation in OR and Possible Post-op intubation/ventilation  Informed Consent: I have reviewed the patients History and Physical, chart, labs and discussed the procedure including the risks, benefits and alternatives for the proposed anesthesia with the patient or authorized representative who has indicated his/her understanding and acceptance.     Dental advisory given  Plan Discussed with: CRNA and Surgeon  Anesthesia Plan Comments:       Anesthesia Quick  Evaluation

## 2019-10-19 NOTE — Progress Notes (Signed)
[address]       ,[place] [address]             [phone]     CARDIOTHORACIC SURGERY OFFICE NOTE  Primary Cardiologist is Evalina Field, MD PCP is Orma Flaming, MD   HPI:  Patient is a 64 year old male who returns to the office today for follow up of recently diagnosed mitral valve prolapse with severe symptomatic primary mitral regurgitation.  He was originally seen in consultation on Sep 25, 2019 at which time we made plans for elective mitral valve repair scheduled for tomorrow.  The patient reports no new problems or complaints over the last few weeks.  He does admit that his symptoms seem to be progressing and that he has been getting short of breath more more often with physical activity.  He continues to deny resting shortness of breath.  He has not had PND, orthopnea, or lower extremity edema.  He has a slight cough.  He has not had fevers or chills.  He otherwise remains well.   Current Outpatient Medications  Medication Sig Dispense Refill   clonazePAM (KLONOPIN) 1 MG tablet TAKE 1 TABLET BY MOUTH  DAILY AT BEDTIME 90 tablet 0   fluticasone (FLONASE) 50 MCG/ACT nasal spray Place 2 sprays into the nose 2 (two) times daily. 16 g 0   ibuprofen (ADVIL) 200 MG tablet Take 400 mg by mouth every 6 (six) hours as needed for moderate pain.     Multiple Vitamin (MULTIVITAMIN) tablet Take 1 tablet by mouth daily.     rosuvastatin (CRESTOR) 20 MG tablet Take 1 tablet (20 mg total) by mouth daily. 90 tablet 3   Saw Palmetto, Serenoa repens, (SAW PALMETTO PO) Take 1 tablet by mouth daily.       traZODone (DESYREL) 100 MG tablet Take one pill at night for sleep (Patient taking differently: Take 100 mg by mouth at bedtime. ) 90 tablet 3   No current facility-administered medications for this visit.   Facility-Administered Medications Ordered in Other Visits  Medication Dose Route Frequency Provider Last Rate Last Admin   [START ON 10/20/2019]  cefUROXime (ZINACEF) 1.5 g in sodium chloride 0.9 % 100 mL IVPB  1.5 g Intravenous To OR Rexene Alberts, MD       [START ON 10/20/2019] cefUROXime (ZINACEF) 750 mg in sodium chloride 0.9 % 100 mL IVPB  750 mg Intravenous To OR Rexene Alberts, MD       [START ON 10/20/2019] dexmedetomidine (PRECEDEX) 400 MCG/100ML (4 mcg/mL) infusion  0.1-0.7 mcg/kg/hr Intravenous To OR Rexene Alberts, MD       [START ON 10/20/2019] EPINEPHrine (ADRENALIN) 4 mg in NS 250 mL (0.016 mg/mL) premix infusion  0-10 mcg/min Intravenous To OR Rexene Alberts, MD       [START ON 10/20/2019] glutaraldehyde 0.625% cardiac soaking solution   Topical To OR Rexene Alberts, MD       [START ON 10/20/2019] heparin 30,000 units/NS 1000 mL solution for CELLSAVER   Other To OR Rexene Alberts, MD       [START ON 10/20/2019] heparin sodium (porcine) 2,500 Units, papaverine 30 mg in electrolyte-148 (PLASMALYTE-148) 500 mL irrigation   Irrigation To OR Rexene Alberts, MD       [START ON 10/20/2019] insulin regular, human (MYXREDLIN) 100 units/ 100 mL infusion   Intravenous To OR Rexene Alberts, MD       [START ON 10/20/2019] [place] Blood  Cardioplegia vial (lidocaine/magnesium/mannitol 0.26g-4g-6.4g)   Intracoronary To OR Rexene Alberts, MD       [START ON 10/20/2019] milrinone (PRIMACOR) 20 MG/100 ML (0.2 mg/mL) infusion  0.3 mcg/kg/min Intravenous To OR Rexene Alberts, MD       [START ON 10/20/2019] nitroGLYCERIN 50 mg in dextrose 5 % 250 mL (0.2 mg/mL) infusion  2-200 mcg/min Intravenous To OR Rexene Alberts, MD       [START ON 10/20/2019] norepinephrine (LEVOPHED) 4mg  in 262mL premix infusion  0-40 mcg/min Intravenous To OR Rexene Alberts, MD       [START ON 10/20/2019] phenylephrine (NEOSYNEPHRINE) 20-0.9 MG/250ML-% infusion  30-200 mcg/min Intravenous To OR Rexene Alberts, MD       [START ON 10/20/2019] potassium chloride injection 80 mEq  80 mEq Other To OR Rexene Alberts, MD       [START ON 10/20/2019]  tranexamic acid (CYKLOKAPRON) 2,500 mg in sodium chloride 0.9 % 250 mL (10 mg/mL) infusion  1.5 mg/kg/hr Intravenous To OR Rexene Alberts, MD       [START ON 10/20/2019] tranexamic acid (CYKLOKAPRON) bolus via infusion - over 30 minutes 1,702.5 mg  15 mg/kg Intravenous To OR Rexene Alberts, MD       [START ON 10/20/2019] tranexamic acid (CYKLOKAPRON) pump prime solution 227 mg  2 mg/kg Intracatheter To OR Rexene Alberts, MD       [START ON 10/20/2019] vancomycin (VANCOCIN) 1,000 mg in sodium chloride 0.9 % 1,000 mL irrigation   Irrigation To OR Rexene Alberts, MD       [START ON 10/20/2019] vancomycin (VANCOREADY) IVPB 1500 mg/300 mL  1,500 mg Intravenous To OR Rexene Alberts, MD          Physical Exam:   BP 137/84 (BP Location: Left Arm, Cuff Size: Normal)    Pulse 92    Resp 18    Ht 6\' 2"  (1.88 m)    Wt 244 lb (110.7 kg)    SpO2 94% Comment: on RA   BMI 31.33 kg/m   General:  Well-appearing  Chest:   Clear to auscultation with symmetrical breath sounds  CV:   Regular rate and rhythm with prominent holosystolic murmur  Incisions:  n/a  Abdomen:  Soft nontender  Extremities:  Warm and well-perfused  Diagnostic Tests:  CT ANGIOGRAPHY CHEST, ABDOMEN AND PELVIS  TECHNIQUE: Multidetector CT imaging through the chest, abdomen and pelvis was performed using the standard protocol during bolus administration of intravenous contrast. Multiplanar reconstructed images and MIPs were obtained and reviewed to evaluate the vascular anatomy.  CONTRAST:  38mL ISOVUE-370 IOPAMIDOL (ISOVUE-370) INJECTION 76%  COMPARISON:  None.  FINDINGS: CTA CHEST FINDINGS  Cardiovascular: Heart size normal. No pericardial effusion. Borderline dilated central pulmonary arteries. Satisfactory opacification of pulmonary arteries noted, and there is no evidence of pulmonary emboli. Adequate contrast opacification of the thoracic aorta with no evidence of dissection, aneurysm, or stenosis. There is  classic 3-vessel brachiocephalic arch anatomy without proximal stenosis. No significant atheromatous change.  Mediastinum/Nodes: No hilar or mediastinal adenopathy.  Lungs/Pleura: No pleural effusion. No pneumothorax. Lungs are clear.  Musculoskeletal: Anterior vertebral endplate spurring at multiple levels in the lower thoracic spine.  Review of the MIP images confirms the above findings.  CTA ABDOMEN AND PELVIS FINDINGS  VASCULAR  Aorta: Minimal atheromatous plaque. No aneurysm, dissection, or stenosis.  Celiac: Patent without evidence of aneurysm, dissection, vasculitis or significant stenosis.  SMA: Patent without evidence of aneurysm, dissection, vasculitis or  significant stenosis.  Renals: Duplicated left, inferior dominant, both patent. Duplicated right, superior dominant, both patent.  IMA: Patent without evidence of aneurysm, dissection, vasculitis or significant stenosis.  Inflow: Mild tortuosity. No aneurysm, dissection, or stenosis. Visualized proximal outflow patent.  Veins: Patent hepatic veins, portal vein, SMV, splenic vein, bilateral renal veins. No venous pathology identified.  Review of the MIP images confirms the above findings.  NON-VASCULAR  Hepatobiliary: Fatty liver. No focal lesion or biliary ductal dilatation. Gallbladder nondistended, unremarkable.  Pancreas: Unremarkable. No pancreatic ductal dilatation or surrounding inflammatory changes.  Spleen: Normal in size without focal abnormality.  Adrenals/Urinary Tract: Adrenal glands unremarkable. Bilateral nephrolithiasis, largest in the mid and lower left collecting system 8 mm. 3 mm calculus, right lower pole collecting system. 2.1 cm cyst from the upper pole left kidney. No hydronephrosis. Bladder physiologically distended containing 7 and 2 mm calculi.  Stomach/Bowel: Stomach is incompletely distended. Prominent proximal duodenal diverticulum. Small bowel is  decompressed. Normal appendix. Colon is nondilated, unremarkable.  Lymphatic: No abdominal or pelvic adenopathy.  Reproductive: Prostate is unremarkable.  Other: No ascites.  No free air.  Musculoskeletal: Small umbilical hernia containing only mesenteric fat. Mild spondylitic changes in the lower lumbar spine.  Review of the MIP images confirms the above findings.  IMPRESSION: 1. Fatty liver. 2. Bilateral nephrolithiasis and bladder calculi as above. No hydronephrosis. 3. Small umbilical hernia containing only mesenteric fat.  Aortic Atherosclerosis (ICD10-I70.0).   Electronically Signed   By: Lucrezia Europe M.D.   On: 10/19/2019 15:39   Impression:  Patient has mitral valve prolapse with stage D severe symptomatic primary mitral regurgitation.  He reports progressive symptoms of exertional shortness of breath and fatigue consistent with chronic diastolic congestive heart failure, New York Heart Association functional class IIb.  Symptoms seem to be accelerating and occur with less and less activity.  I have personally reviewed the patient's recent transthoracic and transesophageal echocardiograms, diagnostic cardiac catheterization, and CT angiograms.  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  Left ventricular systolic function remains preserved.  Diagnostic cardiac catheterization is notable for the absence of significant coronary artery disease and normal right-sided pressures.  CT angiograms revealed no contraindications to peripheral cannulation for surgery.   Plan:  The patient was and his wife were again counseled regarding the indications, risks and potential benefits of mitral valve repair.  The rationale for elective surgery has been  explained, including a comparison between surgery and continued medical therapy with close follow-up.  The likelihood of successful and durable mitral valve repair has been discussed with particular reference to the findings of their recent echocardiogram.  Based upon these findings and previous experience, I have quoted them a greater than 95 percent likelihood of successful valve repair with less than 1 percent risk of mortality or major morbidity.  Alternative surgical approaches have been discussed including a comparison between conventional sternotomy and minimally-invasive techniques.  The relative risks and benefits of each have been reviewed as they pertain to the patient's specific circumstances, and expectations for the patient's postoperative convalescence has been discussed.    The patient understands and accepts all potential risks of surgery including but not limited to risk of death, stroke or other neurologic complication, myocardial infarction, congestive heart failure, respiratory failure, renal failure, bleeding requiring transfusion and/or reexploration, arrhythmia, infection or other wound  complications, pneumonia, pleural and/or pericardial effusion, pulmonary embolus, aortic dissection or other major vascular complication, or delayed complications related to valve repair or replacement including but not limited to structural valve deterioration and failure, thrombosis, embolization, endocarditis, or paravalvular leak.  Specific risks potentially related to the minimally-invasive approach were discussed at length, including but not limited to risk of conversion to full or partial sternotomy, aortic dissection or other major vascular complication, unilateral acute lung injury or pulmonary edema, phrenic nerve dysfunction or paralysis, rib fracture, chronic pain, lung hernia, or lymphocele. All of their questions have been answered.   I spent in excess of 20 minutes during the conduct of  this office consultation and >50% of this time involved direct face-to-face encounter with the patient for counseling and/or coordination of their care.    Valentina Gu. Roxy Manns, MD 10/19/2019 4:29 PM

## 2019-10-19 NOTE — Patient Instructions (Signed)
   Have nothing to eat or drink after midnight the night before surgery.  On the morning of surgery do not take any medications.  At your appointment for Pre-Admission Testing at the [facility] Short-Stay Department you will be asked to sign permission forms for your upcoming surgery.  By definition your signature on these forms implies that you and/or your designee provide full informed consent for your planned surgical procedure(s), that alternative treatment options have been discussed, that you understand and accept any and all potential risks, and that you have some understanding of what to expect for your post-operative convalescence.  For any major cardiac surgical procedure potential operative risks include but are not limited to at least some risk of death, stroke or other neurologic complication, myocardial infarction, congestive heart failure, respiratory failure, renal failure, bleeding requiring blood transfusion and/or reexploration, irregular heart rhythm, heart block or bradycardia requiring permanent pacemaker, pneumonia, pericardial effusion, pleural effusion, wound infection, pulmonary embolus or other thromboembolic complication, chronic pain, or other complications related to the specific procedure(s) performed.  Please call to schedule a follow-up appointment in our office prior to surgery if you have any unresolved questions about your planned surgical procedure, the associated risks, alternative treatment options, and/or expectations for your post-operative recovery.

## 2019-10-20 ENCOUNTER — Inpatient Hospital Stay (HOSPITAL_COMMUNITY)
Admission: RE | Admit: 2019-10-20 | Discharge: 2019-10-24 | DRG: 220 | Disposition: A | Discharge status: Home / Self Care | Payer: Medicare Other | Attending: Thoracic Surgery (Cardiothoracic Vascular Surgery) | Admitting: Thoracic Surgery (Cardiothoracic Vascular Surgery)

## 2019-10-20 ENCOUNTER — Inpatient Hospital Stay (HOSPITAL_COMMUNITY): Payer: Medicare Other

## 2019-10-20 ENCOUNTER — Other Ambulatory Visit: Payer: Self-pay

## 2019-10-20 ENCOUNTER — Encounter (HOSPITAL_COMMUNITY): Payer: Self-pay | Admitting: Thoracic Surgery (Cardiothoracic Vascular Surgery)

## 2019-10-20 ENCOUNTER — Inpatient Hospital Stay (HOSPITAL_COMMUNITY): Payer: Medicare Other | Admitting: Certified Registered Nurse Anesthetist

## 2019-10-20 ENCOUNTER — Inpatient Hospital Stay (HOSPITAL_COMMUNITY): Payer: Medicare Other | Admitting: Physician Assistant

## 2019-10-20 ENCOUNTER — Encounter (HOSPITAL_COMMUNITY)
Admission: RE | Disposition: A | Discharge status: Home / Self Care | Payer: Self-pay | Source: Home / Self Care | Attending: Thoracic Surgery (Cardiothoracic Vascular Surgery)

## 2019-10-20 DIAGNOSIS — D62 Acute posthemorrhagic anemia: Secondary | ICD-10-CM | POA: Diagnosis not present

## 2019-10-20 DIAGNOSIS — I700 Atherosclerosis of aorta: Secondary | ICD-10-CM | POA: Diagnosis present

## 2019-10-20 DIAGNOSIS — I340 Nonrheumatic mitral (valve) insufficiency: Principal | ICD-10-CM | POA: Diagnosis present

## 2019-10-20 DIAGNOSIS — G4700 Insomnia, unspecified: Secondary | ICD-10-CM | POA: Diagnosis present

## 2019-10-20 DIAGNOSIS — E669 Obesity, unspecified: Secondary | ICD-10-CM | POA: Diagnosis present

## 2019-10-20 DIAGNOSIS — I272 Pulmonary hypertension, unspecified: Secondary | ICD-10-CM | POA: Diagnosis present

## 2019-10-20 DIAGNOSIS — Z6831 Body mass index (BMI) 31.0-31.9, adult: Secondary | ICD-10-CM

## 2019-10-20 DIAGNOSIS — Z79899 Other long term (current) drug therapy: Secondary | ICD-10-CM | POA: Diagnosis not present

## 2019-10-20 DIAGNOSIS — N400 Enlarged prostate without lower urinary tract symptoms: Secondary | ICD-10-CM | POA: Diagnosis present

## 2019-10-20 DIAGNOSIS — E785 Hyperlipidemia, unspecified: Secondary | ICD-10-CM | POA: Diagnosis present

## 2019-10-20 DIAGNOSIS — Z8249 Family history of ischemic heart disease and other diseases of the circulatory system: Secondary | ICD-10-CM | POA: Diagnosis not present

## 2019-10-20 DIAGNOSIS — R200 Anesthesia of skin: Secondary | ICD-10-CM | POA: Diagnosis not present

## 2019-10-20 DIAGNOSIS — K760 Fatty (change of) liver, not elsewhere classified: Secondary | ICD-10-CM | POA: Diagnosis present

## 2019-10-20 DIAGNOSIS — J301 Allergic rhinitis due to pollen: Secondary | ICD-10-CM | POA: Diagnosis present

## 2019-10-20 DIAGNOSIS — I493 Ventricular premature depolarization: Secondary | ICD-10-CM | POA: Diagnosis not present

## 2019-10-20 DIAGNOSIS — Z09 Encounter for follow-up examination after completed treatment for conditions other than malignant neoplasm: Secondary | ICD-10-CM

## 2019-10-20 DIAGNOSIS — I5032 Chronic diastolic (congestive) heart failure: Secondary | ICD-10-CM | POA: Diagnosis present

## 2019-10-20 DIAGNOSIS — Z8261 Family history of arthritis: Secondary | ICD-10-CM | POA: Diagnosis not present

## 2019-10-20 DIAGNOSIS — I341 Nonrheumatic mitral (valve) prolapse: Secondary | ICD-10-CM | POA: Diagnosis present

## 2019-10-20 DIAGNOSIS — I77810 Thoracic aortic ectasia: Secondary | ICD-10-CM | POA: Diagnosis present

## 2019-10-20 DIAGNOSIS — J9811 Atelectasis: Secondary | ICD-10-CM

## 2019-10-20 DIAGNOSIS — Z87442 Personal history of urinary calculi: Secondary | ICD-10-CM

## 2019-10-20 DIAGNOSIS — Z9889 Other specified postprocedural states: Secondary | ICD-10-CM

## 2019-10-20 HISTORY — PX: MITRAL VALVE REPAIR: SHX2039

## 2019-10-20 HISTORY — DX: Other specified postprocedural states: Z98.890

## 2019-10-20 HISTORY — PX: TEE WITHOUT CARDIOVERSION: SHX5443

## 2019-10-20 LAB — BASIC METABOLIC PANEL
Anion gap: 7 (ref 5–15)
BUN: 17 mg/dL (ref 8–23)
CO2: 22 mmol/L (ref 22–32)
Calcium: 7.5 mg/dL — ABNORMAL LOW (ref 8.9–10.3)
Chloride: 111 mmol/L (ref 98–111)
Creatinine, Ser: 0.84 mg/dL (ref 0.61–1.24)
GFR calc Af Amer: >60 mL/min (ref >60)
GFR calc non Af Amer: >60 mL/min (ref >60)
Glucose, Bld: 141 mg/dL — ABNORMAL HIGH (ref 70–99)
Potassium: 4.5 mmol/L (ref 3.5–5.1)
Sodium: 140 mmol/L (ref 135–145)

## 2019-10-20 LAB — POCT I-STAT 7, (LYTES, BLD GAS, ICA,H+H)
Acid-Base Excess: 0 mmol/L (ref 0.0–2.0)
Acid-Base Excess: 0 mmol/L (ref 0.0–2.0)
Acid-Base Excess: 2 mmol/L (ref 0.0–2.0)
Acid-base deficit: 1 mmol/L (ref 0.0–2.0)
Acid-base deficit: 2 mmol/L (ref 0.0–2.0)
Acid-base deficit: 5 mmol/L — ABNORMAL HIGH (ref 0.0–2.0)
Acid-base deficit: 4 mmol/L — ABNORMAL HIGH (ref 0.0–2.0)
Acid-base deficit: 5 mmol/L — ABNORMAL HIGH (ref 0.0–2.0)
Acid-base deficit: 5 mmol/L — ABNORMAL HIGH (ref 0.0–2.0)
Bicarbonate: 25.9 mmol/L (ref 20.0–28.0)
Bicarbonate: 26.1 mmol/L (ref 20.0–28.0)
Bicarbonate: 24.3 mmol/L (ref 20.0–28.0)
Bicarbonate: 22.9 mmol/L (ref 20.0–28.0)
Bicarbonate: 21.6 mmol/L (ref 20.0–28.0)
Bicarbonate: 27.3 mmol/L (ref 20.0–28.0)
Bicarbonate: 23 mmol/L (ref 20.0–28.0)
Bicarbonate: 23.3 mmol/L (ref 20.0–28.0)
Bicarbonate: 21.8 mmol/L (ref 20.0–28.0)
Calcium, Ion: 1.24 mmol/L (ref 1.15–1.40)
Calcium, Ion: 1.03 mmol/L — ABNORMAL LOW (ref 1.15–1.40)
Calcium, Ion: 1.08 mmol/L — ABNORMAL LOW (ref 1.15–1.40)
Calcium, Ion: 1.12 mmol/L — ABNORMAL LOW (ref 1.15–1.40)
Calcium, Ion: 1.06 mmol/L — ABNORMAL LOW (ref 1.15–1.40)
Calcium, Ion: 1.1 mmol/L — ABNORMAL LOW (ref 1.15–1.40)
Calcium, Ion: 1.11 mmol/L — ABNORMAL LOW (ref 1.15–1.40)
Calcium, Ion: 1.14 mmol/L — ABNORMAL LOW (ref 1.15–1.40)
Calcium, Ion: 1.13 mmol/L — ABNORMAL LOW (ref 1.15–1.40)
HCT: 44 % (ref 39.0–52.0)
HCT: 37 % — ABNORMAL LOW (ref 39.0–52.0)
HCT: 34 % — ABNORMAL LOW (ref 39.0–52.0)
HCT: 35 % — ABNORMAL LOW (ref 39.0–52.0)
HCT: 34 % — ABNORMAL LOW (ref 39.0–52.0)
HCT: 35 % — ABNORMAL LOW (ref 39.0–52.0)
HCT: 40 % (ref 39.0–52.0)
HCT: 43 % (ref 39.0–52.0)
HCT: 40 % (ref 39.0–52.0)
Hemoglobin: 15 g/dL (ref 13.0–17.0)
Hemoglobin: 12.6 g/dL — ABNORMAL LOW (ref 13.0–17.0)
Hemoglobin: 11.6 g/dL — ABNORMAL LOW (ref 13.0–17.0)
Hemoglobin: 11.9 g/dL — ABNORMAL LOW (ref 13.0–17.0)
Hemoglobin: 11.6 g/dL — ABNORMAL LOW (ref 13.0–17.0)
Hemoglobin: 11.9 g/dL — ABNORMAL LOW (ref 13.0–17.0)
Hemoglobin: 13.6 g/dL (ref 13.0–17.0)
Hemoglobin: 14.6 g/dL (ref 13.0–17.0)
Hemoglobin: 13.6 g/dL (ref 13.0–17.0)
O2 Saturation: 100 %
O2 Saturation: 100 %
O2 Saturation: 100 %
O2 Saturation: 100 %
O2 Saturation: 100 %
O2 Saturation: 91 %
O2 Saturation: 95 %
O2 Saturation: 99 %
O2 Saturation: 94 %
Patient temperature: 36.9
Patient temperature: 37.5
Patient temperature: 36.9
Potassium: 4.3 mmol/L (ref 3.5–5.1)
Potassium: 5.1 mmol/L (ref 3.5–5.1)
Potassium: 5.9 mmol/L — ABNORMAL HIGH (ref 3.5–5.1)
Potassium: 4.8 mmol/L (ref 3.5–5.1)
Potassium: 3.5 mmol/L (ref 3.5–5.1)
Potassium: 4.5 mmol/L (ref 3.5–5.1)
Potassium: 3.9 mmol/L (ref 3.5–5.1)
Potassium: 4.2 mmol/L (ref 3.5–5.1)
Potassium: 4.4 mmol/L (ref 3.5–5.1)
Sodium: 141 mmol/L (ref 135–145)
Sodium: 139 mmol/L (ref 135–145)
Sodium: 136 mmol/L (ref 135–145)
Sodium: 139 mmol/L (ref 135–145)
Sodium: 142 mmol/L (ref 135–145)
Sodium: 143 mmol/L (ref 135–145)
Sodium: 142 mmol/L (ref 135–145)
Sodium: 143 mmol/L (ref 135–145)
Sodium: 142 mmol/L (ref 135–145)
TCO2: 27 mmol/L (ref 22–32)
TCO2: 28 mmol/L (ref 22–32)
TCO2: 25 mmol/L (ref 22–32)
TCO2: 24 mmol/L (ref 22–32)
TCO2: 23 mmol/L (ref 22–32)
TCO2: 29 mmol/L (ref 22–32)
TCO2: 24 mmol/L (ref 22–32)
TCO2: 25 mmol/L (ref 22–32)
TCO2: 23 mmol/L (ref 22–32)
pCO2 arterial: 48.9 mmHg — ABNORMAL HIGH (ref 32.0–48.0)
pCO2 arterial: 49.4 mmHg — ABNORMAL HIGH (ref 32.0–48.0)
pCO2 arterial: 39.4 mmHg (ref 32.0–48.0)
pCO2 arterial: 40.6 mmHg (ref 32.0–48.0)
pCO2 arterial: 42.3 mmHg (ref 32.0–48.0)
pCO2 arterial: 45.5 mmHg (ref 32.0–48.0)
pCO2 arterial: 49.1 mmHg — ABNORMAL HIGH (ref 32.0–48.0)
pCO2 arterial: 52.3 mmHg — ABNORMAL HIGH (ref 32.0–48.0)
pCO2 arterial: 46.6 mmHg (ref 32.0–48.0)
pH, Arterial: 7.332 — ABNORMAL LOW (ref 7.350–7.450)
pH, Arterial: 7.331 — ABNORMAL LOW (ref 7.350–7.450)
pH, Arterial: 7.398 (ref 7.350–7.450)
pH, Arterial: 7.359 (ref 7.350–7.450)
pH, Arterial: 7.284 — ABNORMAL LOW (ref 7.350–7.450)
pH, Arterial: 7.418 (ref 7.350–7.450)
pH, Arterial: 7.256 — ABNORMAL LOW (ref 7.350–7.450)
pH, Arterial: 7.282 — ABNORMAL LOW (ref 7.350–7.450)
pH, Arterial: 7.278 — ABNORMAL LOW (ref 7.350–7.450)
pO2, Arterial: 209 mmHg — ABNORMAL HIGH (ref 83.0–108.0)
pO2, Arterial: 347 mmHg — ABNORMAL HIGH (ref 83.0–108.0)
pO2, Arterial: 306 mmHg — ABNORMAL HIGH (ref 83.0–108.0)
pO2, Arterial: 324 mmHg — ABNORMAL HIGH (ref 83.0–108.0)
pO2, Arterial: 288 mmHg — ABNORMAL HIGH (ref 83.0–108.0)
pO2, Arterial: 70 mmHg — ABNORMAL LOW (ref 83.0–108.0)
pO2, Arterial: 136 mmHg — ABNORMAL HIGH (ref 83.0–108.0)
pO2, Arterial: 86 mmHg (ref 83.0–108.0)
pO2, Arterial: 81 mmHg — ABNORMAL LOW (ref 83.0–108.0)

## 2019-10-20 LAB — CBC
HCT: 41.4 % (ref 39.0–52.0)
HCT: 39.2 % (ref 39.0–52.0)
Hemoglobin: 14.3 g/dL (ref 13.0–17.0)
Hemoglobin: 13.1 g/dL (ref 13.0–17.0)
MCH: 34.5 pg — ABNORMAL HIGH (ref 26.0–34.0)
MCH: 33.7 pg (ref 26.0–34.0)
MCHC: 34.5 g/dL (ref 30.0–36.0)
MCHC: 33.4 g/dL (ref 30.0–36.0)
MCV: 100 fL (ref 80.0–100.0)
MCV: 100.8 fL — ABNORMAL HIGH (ref 80.0–100.0)
Platelets: 147 10*3/uL — ABNORMAL LOW (ref 150–400)
Platelets: 129 10*3/uL — ABNORMAL LOW (ref 150–400)
RBC: 4.14 MIL/uL — ABNORMAL LOW (ref 4.22–5.81)
RBC: 3.89 MIL/uL — ABNORMAL LOW (ref 4.22–5.81)
RDW: 12.4 % (ref 11.5–15.5)
RDW: 12.6 % (ref 11.5–15.5)
WBC: 18.6 10*3/uL — ABNORMAL HIGH (ref 4.0–10.5)
WBC: 16.2 10*3/uL — ABNORMAL HIGH (ref 4.0–10.5)
nRBC: 0 % (ref 0.0–0.2)
nRBC: 0 % (ref 0.0–0.2)

## 2019-10-20 LAB — POCT I-STAT, CHEM 8
BUN: 20 mg/dL (ref 8–23)
BUN: 21 mg/dL (ref 8–23)
BUN: 19 mg/dL (ref 8–23)
BUN: 19 mg/dL (ref 8–23)
BUN: 20 mg/dL (ref 8–23)
BUN: 19 mg/dL (ref 8–23)
Calcium, Ion: 1.28 mmol/L (ref 1.15–1.40)
Calcium, Ion: 1.24 mmol/L (ref 1.15–1.40)
Calcium, Ion: 1.05 mmol/L — ABNORMAL LOW (ref 1.15–1.40)
Calcium, Ion: 1.13 mmol/L — ABNORMAL LOW (ref 1.15–1.40)
Calcium, Ion: 1.13 mmol/L — ABNORMAL LOW (ref 1.15–1.40)
Calcium, Ion: 1.08 mmol/L — ABNORMAL LOW (ref 1.15–1.40)
Chloride: 104 mmol/L (ref 98–111)
Chloride: 104 mmol/L (ref 98–111)
Chloride: 101 mmol/L (ref 98–111)
Chloride: 104 mmol/L (ref 98–111)
Chloride: 107 mmol/L (ref 98–111)
Chloride: 106 mmol/L (ref 98–111)
Creatinine, Ser: 0.8 mg/dL (ref 0.61–1.24)
Creatinine, Ser: 1 mg/dL (ref 0.61–1.24)
Creatinine, Ser: 0.8 mg/dL (ref 0.61–1.24)
Creatinine, Ser: 0.7 mg/dL (ref 0.61–1.24)
Creatinine, Ser: 0.8 mg/dL (ref 0.61–1.24)
Creatinine, Ser: 0.8 mg/dL (ref 0.61–1.24)
Glucose, Bld: 133 mg/dL — ABNORMAL HIGH (ref 70–99)
Glucose, Bld: 158 mg/dL — ABNORMAL HIGH (ref 70–99)
Glucose, Bld: 157 mg/dL — ABNORMAL HIGH (ref 70–99)
Glucose, Bld: 205 mg/dL — ABNORMAL HIGH (ref 70–99)
Glucose, Bld: 195 mg/dL — ABNORMAL HIGH (ref 70–99)
Glucose, Bld: 165 mg/dL — ABNORMAL HIGH (ref 70–99)
HCT: 44 % (ref 39.0–52.0)
HCT: 44 % (ref 39.0–52.0)
HCT: 32 % — ABNORMAL LOW (ref 39.0–52.0)
HCT: 34 % — ABNORMAL LOW (ref 39.0–52.0)
HCT: 33 % — ABNORMAL LOW (ref 39.0–52.0)
HCT: 36 % — ABNORMAL LOW (ref 39.0–52.0)
Hemoglobin: 15 g/dL (ref 13.0–17.0)
Hemoglobin: 15 g/dL (ref 13.0–17.0)
Hemoglobin: 10.9 g/dL — ABNORMAL LOW (ref 13.0–17.0)
Hemoglobin: 11.6 g/dL — ABNORMAL LOW (ref 13.0–17.0)
Hemoglobin: 11.2 g/dL — ABNORMAL LOW (ref 13.0–17.0)
Hemoglobin: 12.2 g/dL — ABNORMAL LOW (ref 13.0–17.0)
Potassium: 4.3 mmol/L (ref 3.5–5.1)
Potassium: 4.9 mmol/L (ref 3.5–5.1)
Potassium: 6 mmol/L — ABNORMAL HIGH (ref 3.5–5.1)
Potassium: 5.4 mmol/L — ABNORMAL HIGH (ref 3.5–5.1)
Potassium: 5.2 mmol/L — ABNORMAL HIGH (ref 3.5–5.1)
Potassium: 3.5 mmol/L (ref 3.5–5.1)
Sodium: 141 mmol/L (ref 135–145)
Sodium: 139 mmol/L (ref 135–145)
Sodium: 132 mmol/L — ABNORMAL LOW (ref 135–145)
Sodium: 137 mmol/L (ref 135–145)
Sodium: 137 mmol/L (ref 135–145)
Sodium: 143 mmol/L (ref 135–145)
TCO2: 25 mmol/L (ref 22–32)
TCO2: 24 mmol/L (ref 22–32)
TCO2: 24 mmol/L (ref 22–32)
TCO2: 23 mmol/L (ref 22–32)
TCO2: 23 mmol/L (ref 22–32)
TCO2: 20 mmol/L — ABNORMAL LOW (ref 22–32)

## 2019-10-20 LAB — GLUCOSE, CAPILLARY
Glucose-Capillary: 148 mg/dL — ABNORMAL HIGH (ref 70–99)
Glucose-Capillary: 147 mg/dL — ABNORMAL HIGH (ref 70–99)
Glucose-Capillary: 154 mg/dL — ABNORMAL HIGH (ref 70–99)
Glucose-Capillary: 146 mg/dL — ABNORMAL HIGH (ref 70–99)
Glucose-Capillary: 138 mg/dL — ABNORMAL HIGH (ref 70–99)
Glucose-Capillary: 143 mg/dL — ABNORMAL HIGH (ref 70–99)
Glucose-Capillary: 148 mg/dL — ABNORMAL HIGH (ref 70–99)
Glucose-Capillary: 177 mg/dL — ABNORMAL HIGH (ref 70–99)
Glucose-Capillary: 175 mg/dL — ABNORMAL HIGH (ref 70–99)
Glucose-Capillary: 141 mg/dL — ABNORMAL HIGH (ref 70–99)

## 2019-10-20 LAB — MAGNESIUM: Magnesium: 2.8 mg/dL — ABNORMAL HIGH (ref 1.7–2.4)

## 2019-10-20 LAB — HEMOGLOBIN AND HEMATOCRIT, BLOOD
HCT: 35.8 % — ABNORMAL LOW (ref 39.0–52.0)
Hemoglobin: 12.2 g/dL — ABNORMAL LOW (ref 13.0–17.0)

## 2019-10-20 LAB — PROTIME-INR
INR: 1.3 — ABNORMAL HIGH (ref 0.8–1.2)
Prothrombin Time: 15.3 seconds — ABNORMAL HIGH (ref 11.4–15.2)

## 2019-10-20 LAB — ECHO INTRAOPERATIVE TEE
Height: 74 in
Weight: 3904 oz

## 2019-10-20 LAB — APTT: aPTT: 34 seconds (ref 24–36)

## 2019-10-20 LAB — PLATELET COUNT: Platelets: 151 10*3/uL (ref 150–400)

## 2019-10-20 SURGERY — REPAIR, MITRAL VALVE, MINIMALLY INVASIVE
Anesthesia: General | Site: Chest | Laterality: Right

## 2019-10-20 MED ORDER — MIDAZOLAM HCL 5 MG/5ML IJ SOLN
INTRAMUSCULAR | Status: DC | PRN
  Administered 2019-10-20: 1 mg via INTRAVENOUS
  Administered 2019-10-20: 2 mg via INTRAVENOUS
  Administered 2019-10-20: 3 mg via INTRAVENOUS

## 2019-10-20 MED ORDER — PNEUMOCOCCAL VAC POLYVALENT 25 MCG/0.5ML IJ INJ: 0.5000 mL | INJECTION | INTRAMUSCULAR | Status: DC | PRN

## 2019-10-20 MED ORDER — ACETAMINOPHEN 500 MG PO TABS
ORAL_TABLET | ORAL | Status: AC
  Administered 2019-10-20: 1000 mg via ORAL
  Filled 2019-10-20: qty 2

## 2019-10-20 MED ORDER — CHLORHEXIDINE GLUCONATE CLOTH 2 % EX PADS
6.0000 | MEDICATED_PAD | Freq: Every day | CUTANEOUS | Status: DC
  Administered 2019-10-20 – 2019-10-24 (×4): 6 via TOPICAL

## 2019-10-20 MED ORDER — MAGNESIUM SULFATE 4 GM/100ML IV SOLN
4.0000 g | Freq: Once | INTRAVENOUS | Status: AC
  Administered 2019-10-20: 4 g via INTRAVENOUS
  Filled 2019-10-20: qty 100

## 2019-10-20 MED ORDER — MORPHINE SULFATE (PF) 2 MG/ML IV SOLN
1.0000 mg | INTRAVENOUS | Status: DC | PRN
  Administered 2019-10-20 (×3): 2 mg via INTRAVENOUS
  Administered 2019-10-21: 4 mg via INTRAVENOUS
  Filled 2019-10-20 (×2): qty 1
  Filled 2019-10-20: qty 2
  Filled 2019-10-20: qty 1

## 2019-10-20 MED ORDER — PHENYLEPHRINE HCL (PRESSORS) 10 MG/ML IV SOLN
INTRAVENOUS | Status: DC | PRN
  Administered 2019-10-20: 80 ug via INTRAVENOUS
  Administered 2019-10-20 (×2): 40 ug via INTRAVENOUS
  Administered 2019-10-20: 80 ug via INTRAVENOUS
  Administered 2019-10-20: 120 ug via INTRAVENOUS
  Administered 2019-10-20: 80 ug via INTRAVENOUS
  Administered 2019-10-20: 40 ug via INTRAVENOUS
  Administered 2019-10-20 (×3): 80 ug via INTRAVENOUS

## 2019-10-20 MED ORDER — OXYCODONE HCL 5 MG PO TABS
5.0000 mg | ORAL_TABLET | ORAL | Status: DC | PRN
  Administered 2019-10-20 – 2019-10-23 (×13): 10 mg via ORAL
  Filled 2019-10-20 (×13): qty 2

## 2019-10-20 MED ORDER — DEXAMETHASONE SODIUM PHOSPHATE 10 MG/ML IJ SOLN
INTRAMUSCULAR | Status: DC | PRN
Start: 2019-10-20 — End: 2019-10-20
  Administered 2019-10-20: 10 mg via INTRAVENOUS

## 2019-10-20 MED ORDER — LACTATED RINGERS IV SOLN
INTRAVENOUS | Status: DC | PRN
Start: 2019-10-20 — End: 2019-10-20

## 2019-10-20 MED ORDER — SODIUM CHLORIDE 0.9 % IV SOLN: INTRAVENOUS | Status: DC

## 2019-10-20 MED ORDER — METOPROLOL TARTRATE 12.5 MG HALF TABLET: 12.5000 mg | ORAL_TABLET | Freq: Once | ORAL | Status: AC

## 2019-10-20 MED ORDER — MILRINONE LACTATE IN DEXTROSE 20-5 MG/100ML-% IV SOLN
0.0000 ug/kg/min | INTRAVENOUS | Status: DC
  Administered 2019-10-20 (×2): 0.25 ug/kg/min via INTRAVENOUS
  Filled 2019-10-20: qty 100

## 2019-10-20 MED ORDER — PROTAMINE SULFATE 10 MG/ML IV SOLN
INTRAVENOUS | Status: DC | PRN
  Administered 2019-10-20: 20 mg via INTRAVENOUS
  Administered 2019-10-20: 330 mg via INTRAVENOUS

## 2019-10-20 MED ORDER — ONDANSETRON HCL 4 MG/2ML IJ SOLN
4.0000 mg | Freq: Four times a day (QID) | INTRAMUSCULAR | Status: DC | PRN
  Administered 2019-10-20 – 2019-10-24 (×5): 4 mg via INTRAVENOUS
  Filled 2019-10-20 (×5): qty 2

## 2019-10-20 MED ORDER — POTASSIUM CHLORIDE 10 MEQ/50ML IV SOLN: 10.0000 meq | INTRAVENOUS | Status: AC

## 2019-10-20 MED ORDER — CHLORHEXIDINE GLUCONATE 0.12 % MT SOLN
15.0000 mL | OROMUCOSAL | Status: AC
  Administered 2019-10-20: 15 mL via OROMUCOSAL
  Filled 2019-10-20: qty 15

## 2019-10-20 MED ORDER — SODIUM CHLORIDE 0.9% FLUSH
3.0000 mL | Freq: Two times a day (BID) | INTRAVENOUS | Status: DC
  Administered 2019-10-21 – 2019-10-24 (×5): 3 mL via INTRAVENOUS

## 2019-10-20 MED ORDER — BUPIVACAINE LIPOSOME 1.3 % IJ SUSP
INTRAMUSCULAR | Status: DC | PRN
  Administered 2019-10-20: 50 mL

## 2019-10-20 MED ORDER — ROCURONIUM BROMIDE 10 MG/ML (PF) SYRINGE
PREFILLED_SYRINGE | INTRAVENOUS | Status: AC
  Filled 2019-10-20: qty 20

## 2019-10-20 MED ORDER — BUPIVACAINE HCL (PF) 0.5 % IJ SOLN
INTRAMUSCULAR | Status: AC
  Filled 2019-10-20: qty 30

## 2019-10-20 MED ORDER — VANCOMYCIN HCL IN DEXTROSE 1-5 GM/200ML-% IV SOLN
1000.0000 mg | Freq: Once | INTRAVENOUS | Status: AC
  Administered 2019-10-20: 1000 mg via INTRAVENOUS
  Filled 2019-10-20: qty 200

## 2019-10-20 MED ORDER — ACETAMINOPHEN 500 MG PO TABS
1000.0000 mg | ORAL_TABLET | Freq: Four times a day (QID) | ORAL | Status: DC
  Administered 2019-10-20 – 2019-10-24 (×12): 1000 mg via ORAL
  Filled 2019-10-20 (×11): qty 2

## 2019-10-20 MED ORDER — SODIUM CHLORIDE 0.9% FLUSH: 10.0000 mL | INTRAVENOUS | Status: DC | PRN

## 2019-10-20 MED ORDER — ARTIFICIAL TEARS OPHTHALMIC OINT
TOPICAL_OINTMENT | OPHTHALMIC | Status: DC | PRN
  Administered 2019-10-20: 1 via OPHTHALMIC

## 2019-10-20 MED ORDER — VANCOMYCIN HCL 1000 MG IV SOLR: INTRAVENOUS | Status: DC | PRN

## 2019-10-20 MED ORDER — DEXMEDETOMIDINE HCL IN NACL 400 MCG/100ML IV SOLN
0.0000 ug/kg/h | INTRAVENOUS | Status: DC
  Administered 2019-10-21: 0.3 ug/kg/h via INTRAVENOUS
  Filled 2019-10-20: qty 100

## 2019-10-20 MED ORDER — DOCUSATE SODIUM 100 MG PO CAPS
200.0000 mg | ORAL_CAPSULE | Freq: Every day | ORAL | Status: DC
  Administered 2019-10-21 – 2019-10-24 (×3): 200 mg via ORAL
  Filled 2019-10-20 (×4): qty 2

## 2019-10-20 MED ORDER — INSULIN REGULAR(HUMAN) IN NACL 100-0.9 UT/100ML-% IV SOLN
INTRAVENOUS | Status: DC
  Filled 2019-10-20: qty 100

## 2019-10-20 MED ORDER — DEXAMETHASONE SODIUM PHOSPHATE 10 MG/ML IJ SOLN
INTRAMUSCULAR | Status: AC
  Filled 2019-10-20: qty 1

## 2019-10-20 MED ORDER — FAMOTIDINE IN NACL 20-0.9 MG/50ML-% IV SOLN
20.0000 mg | Freq: Two times a day (BID) | INTRAVENOUS | Status: DC
  Administered 2019-10-20: 20 mg via INTRAVENOUS
  Filled 2019-10-20: qty 50

## 2019-10-20 MED ORDER — FENTANYL CITRATE (PF) 250 MCG/5ML IJ SOLN
INTRAMUSCULAR | Status: DC | PRN
  Administered 2019-10-20 (×3): 100 ug via INTRAVENOUS
  Administered 2019-10-20: 300 ug via INTRAVENOUS
  Administered 2019-10-20: 100 ug via INTRAVENOUS
  Administered 2019-10-20: 150 ug via INTRAVENOUS

## 2019-10-20 MED ORDER — ALBUMIN HUMAN 5 % IV SOLN
250.0000 mL | INTRAVENOUS | Status: AC | PRN
  Administered 2019-10-20 (×2): 12.5 g via INTRAVENOUS

## 2019-10-20 MED ORDER — CHLORHEXIDINE GLUCONATE 0.12 % MT SOLN
OROMUCOSAL | Status: AC
  Administered 2019-10-20: 15 mL via OROMUCOSAL
  Filled 2019-10-20: qty 15

## 2019-10-20 MED ORDER — PHENYLEPHRINE HCL-NACL 20-0.9 MG/250ML-% IV SOLN: 0.0000 ug/min | INTRAVENOUS | Status: DC

## 2019-10-20 MED ORDER — MIDAZOLAM HCL (PF) 10 MG/2ML IJ SOLN
INTRAMUSCULAR | Status: AC
  Filled 2019-10-20: qty 2

## 2019-10-20 MED ORDER — FENTANYL CITRATE (PF) 250 MCG/5ML IJ SOLN
INTRAMUSCULAR | Status: AC
  Filled 2019-10-20: qty 10

## 2019-10-20 MED ORDER — LACTATED RINGERS IV SOLN: INTRAVENOUS | Status: DC

## 2019-10-20 MED ORDER — SODIUM CHLORIDE 0.9% FLUSH
10.0000 mL | Freq: Two times a day (BID) | INTRAVENOUS | Status: DC
  Administered 2019-10-20 – 2019-10-21 (×2): 10 mL

## 2019-10-20 MED ORDER — ACETAMINOPHEN 500 MG PO TABS: 1000.0000 mg | ORAL_TABLET | Freq: Once | ORAL | Status: AC

## 2019-10-20 MED ORDER — CHLORHEXIDINE GLUCONATE 4 % EX LIQD: 30.0000 mL | CUTANEOUS | Status: DC

## 2019-10-20 MED ORDER — ASPIRIN 81 MG PO CHEW: 324.0000 mg | CHEWABLE_TABLET | Freq: Every day | ORAL | Status: DC

## 2019-10-20 MED ORDER — ORAL CARE MOUTH RINSE
15.0000 mL | Freq: Two times a day (BID) | OROMUCOSAL | Status: DC
  Administered 2019-10-20 – 2019-10-24 (×7): 15 mL via OROMUCOSAL

## 2019-10-20 MED ORDER — CHLORHEXIDINE GLUCONATE 0.12 % MT SOLN: 15.0000 mL | Freq: Once | OROMUCOSAL | Status: AC

## 2019-10-20 MED ORDER — HEPARIN SODIUM (PORCINE) 1000 UNIT/ML IJ SOLN
INTRAMUSCULAR | Status: AC
  Filled 2019-10-20: qty 1

## 2019-10-20 MED ORDER — PANTOPRAZOLE SODIUM 40 MG PO TBEC
40.0000 mg | DELAYED_RELEASE_TABLET | Freq: Every day | ORAL | Status: DC
  Administered 2019-10-22 – 2019-10-24 (×3): 40 mg via ORAL
  Filled 2019-10-20 (×3): qty 1

## 2019-10-20 MED ORDER — PLASMA-LYTE 148 IV SOLN
INTRAVENOUS | Status: DC | PRN
  Administered 2019-10-20: 500 mL

## 2019-10-20 MED ORDER — SODIUM CHLORIDE 0.9 % IV SOLN
1.5000 g | Freq: Two times a day (BID) | INTRAVENOUS | Status: AC
  Administered 2019-10-20 – 2019-10-22 (×4): 1.5 g via INTRAVENOUS
  Filled 2019-10-20 (×4): qty 1.5

## 2019-10-20 MED ORDER — ROCURONIUM BROMIDE 10 MG/ML (PF) SYRINGE
PREFILLED_SYRINGE | INTRAVENOUS | Status: AC
  Filled 2019-10-20: qty 10

## 2019-10-20 MED ORDER — METOPROLOL TARTRATE 12.5 MG HALF TABLET
ORAL_TABLET | ORAL | Status: AC
  Administered 2019-10-20: 12.5 mg via ORAL
  Filled 2019-10-20: qty 1

## 2019-10-20 MED ORDER — METOPROLOL TARTRATE 5 MG/5ML IV SOLN: 2.5000 mg | INTRAVENOUS | Status: DC | PRN

## 2019-10-20 MED ORDER — BISACODYL 10 MG RE SUPP: 10.0000 mg | Freq: Every day | RECTAL | Status: DC

## 2019-10-20 MED ORDER — ACETAMINOPHEN 650 MG RE SUPP
650.0000 mg | Freq: Once | RECTAL | Status: AC
  Administered 2019-10-20: 650 mg via RECTAL

## 2019-10-20 MED ORDER — PROTAMINE SULFATE 10 MG/ML IV SOLN
INTRAVENOUS | Status: AC
  Filled 2019-10-20: qty 25

## 2019-10-20 MED ORDER — ONDANSETRON HCL 4 MG/2ML IJ SOLN
INTRAMUSCULAR | Status: AC
  Filled 2019-10-20: qty 2

## 2019-10-20 MED ORDER — ASPIRIN EC 325 MG PO TBEC: 325.0000 mg | DELAYED_RELEASE_TABLET | Freq: Every day | ORAL | Status: DC

## 2019-10-20 MED ORDER — LACTATED RINGERS IV SOLN: INTRAVENOUS | Status: DC | PRN

## 2019-10-20 MED ORDER — SODIUM CHLORIDE 0.9 % IV SOLN: 250.0000 mL | INTRAVENOUS | Status: DC

## 2019-10-20 MED ORDER — 0.9 % SODIUM CHLORIDE (POUR BTL) OPTIME
TOPICAL | Status: DC | PRN
  Administered 2019-10-20: 1000 mL

## 2019-10-20 MED ORDER — HEPARIN SODIUM (PORCINE) 1000 UNIT/ML IJ SOLN
INTRAMUSCULAR | Status: DC | PRN
  Administered 2019-10-20: 35000 [IU] via INTRAVENOUS

## 2019-10-20 MED ORDER — ROCURONIUM BROMIDE 10 MG/ML (PF) SYRINGE
PREFILLED_SYRINGE | INTRAVENOUS | Status: DC | PRN
  Administered 2019-10-20: 10 mg via INTRAVENOUS
  Administered 2019-10-20: 100 mg via INTRAVENOUS
  Administered 2019-10-20: 40 mg via INTRAVENOUS
  Administered 2019-10-20: 10 mg via INTRAVENOUS
  Administered 2019-10-20: 30 mg via INTRAVENOUS
  Administered 2019-10-20: 10 mg via INTRAVENOUS
  Administered 2019-10-20: 30 mg via INTRAVENOUS
  Administered 2019-10-20: 10 mg via INTRAVENOUS

## 2019-10-20 MED ORDER — ACETAMINOPHEN 160 MG/5ML PO SOLN: 650.0000 mg | Freq: Once | ORAL | Status: AC

## 2019-10-20 MED ORDER — BISACODYL 5 MG PO TBEC
10.0000 mg | DELAYED_RELEASE_TABLET | Freq: Every day | ORAL | Status: DC
  Administered 2019-10-21 – 2019-10-24 (×3): 10 mg via ORAL
  Filled 2019-10-20 (×3): qty 2

## 2019-10-20 MED ORDER — PROPOFOL 10 MG/ML IV BOLUS
INTRAVENOUS | Status: AC
  Filled 2019-10-20: qty 20

## 2019-10-20 MED ORDER — SODIUM CHLORIDE 0.9 % IR SOLN
Status: DC | PRN
  Administered 2019-10-20: 3000 mL

## 2019-10-20 MED ORDER — MIDAZOLAM HCL 2 MG/2ML IJ SOLN: 2.0000 mg | INTRAMUSCULAR | Status: DC | PRN

## 2019-10-20 MED ORDER — SODIUM BICARBONATE 8.4 % IV SOLN
50.0000 meq | Freq: Once | INTRAVENOUS | Status: AC
  Administered 2019-10-20: 50 meq via INTRAVENOUS

## 2019-10-20 MED ORDER — SODIUM CHLORIDE 0.9 % IV SOLN
INTRAVENOUS | Status: DC | PRN
Start: 2019-10-20 — End: 2019-10-20

## 2019-10-20 MED ORDER — NITROGLYCERIN IN D5W 200-5 MCG/ML-% IV SOLN: 0.0000 ug/min | INTRAVENOUS | Status: DC

## 2019-10-20 MED ORDER — ALBUMIN HUMAN 5 % IV SOLN: INTRAVENOUS | Status: DC | PRN

## 2019-10-20 MED ORDER — DEXTROSE 50 % IV SOLN: 0.0000 mL | INTRAVENOUS | Status: DC | PRN

## 2019-10-20 MED ORDER — ALBUTEROL SULFATE HFA 108 (90 BASE) MCG/ACT IN AERS
INHALATION_SPRAY | RESPIRATORY_TRACT | Status: DC | PRN
Start: 2019-10-20 — End: 2019-10-20
  Administered 2019-10-20: 3 via RESPIRATORY_TRACT

## 2019-10-20 MED ORDER — SODIUM CHLORIDE 0.9% FLUSH: 3.0000 mL | INTRAVENOUS | Status: DC | PRN

## 2019-10-20 MED ORDER — SCOPOLAMINE 1 MG/3DAYS TD PT72
MEDICATED_PATCH | TRANSDERMAL | Status: DC | PRN
  Administered 2019-10-20: 1 via TRANSDERMAL

## 2019-10-20 MED ORDER — LACTATED RINGERS IV SOLN: 500.0000 mL | Freq: Once | INTRAVENOUS | Status: DC | PRN

## 2019-10-20 MED ORDER — SODIUM CHLORIDE 0.45 % IV SOLN: INTRAVENOUS | Status: DC | PRN

## 2019-10-20 MED ORDER — PROPOFOL 10 MG/ML IV BOLUS
INTRAVENOUS | Status: DC | PRN
  Administered 2019-10-20: 50 mg via INTRAVENOUS

## 2019-10-20 MED ORDER — TRAMADOL HCL 50 MG PO TABS
50.0000 mg | ORAL_TABLET | ORAL | Status: DC | PRN
  Administered 2019-10-20 – 2019-10-22 (×5): 100 mg via ORAL
  Administered 2019-10-23: 50 mg via ORAL
  Administered 2019-10-23: 100 mg via ORAL
  Administered 2019-10-24: 50 mg via ORAL
  Filled 2019-10-20 (×5): qty 2
  Filled 2019-10-20: qty 1
  Filled 2019-10-20: qty 2
  Filled 2019-10-20: qty 1

## 2019-10-20 MED ORDER — DEXMEDETOMIDINE HCL IN NACL 200 MCG/50ML IV SOLN
INTRAVENOUS | Status: AC
  Filled 2019-10-20: qty 50

## 2019-10-20 MED ORDER — ACETAMINOPHEN 160 MG/5ML PO SOLN: 1000.0000 mg | Freq: Four times a day (QID) | ORAL | Status: DC

## 2019-10-20 MED ORDER — ARTIFICIAL TEARS OPHTHALMIC OINT
TOPICAL_OINTMENT | OPHTHALMIC | Status: AC
  Filled 2019-10-20: qty 3.5

## 2019-10-20 MED ORDER — ONDANSETRON HCL 4 MG/2ML IJ SOLN
INTRAMUSCULAR | Status: DC | PRN
Start: 2019-10-20 — End: 2019-10-20
  Administered 2019-10-20: 4 mg via INTRAVENOUS

## 2019-10-20 MED ORDER — PROTAMINE SULFATE 10 MG/ML IV SOLN
INTRAVENOUS | Status: AC
  Filled 2019-10-20: qty 10

## 2019-10-20 SURGICAL SUPPLY — 115 items
ADAPTER CARDIO PERF ANTE/RETRO (ADAPTER) ×3 IMPLANT
ADH SKN CLS APL DERMABOND .7 (GAUZE/BANDAGES/DRESSINGS) ×2
ADH SKN CLS LQ APL DERMABOND (GAUZE/BANDAGES/DRESSINGS) ×2
ADPR PRFSN 84XANTGRD RTRGD (ADAPTER) ×2
APPLIER CLIP 11 MED OPEN (CLIP) ×3
APR CLP MED 11 20 MLT OPN (CLIP) ×2
BAG DECANTER FOR FLEXI CONT (MISCELLANEOUS) ×6 IMPLANT
BLADE CLIPPER SURG (BLADE) ×3 IMPLANT
BLADE SURG 11 STRL SS (BLADE) ×3 IMPLANT
CANISTER SUCT 3000ML PPV (MISCELLANEOUS) ×6 IMPLANT
CANNULA ADULT BIO-MEDICUS 15FR (CANNULA) ×1 IMPLANT
CANNULA FEM VENOUS REMOTE 22FR (CANNULA) ×1 IMPLANT
CANNULA FEMORAL ART 14 SM (MISCELLANEOUS) ×3 IMPLANT
CANNULA GUNDRY RCSP 15FR (MISCELLANEOUS) ×3 IMPLANT
CANNULA OPTISITE PERFUSION 16F (CANNULA) IMPLANT
CANNULA OPTISITE PERFUSION 18F (CANNULA) ×1 IMPLANT
CANNULA SUMP PERICARDIAL (CANNULA) ×6 IMPLANT
CATH CPB KIT OWEN (MISCELLANEOUS) IMPLANT
CATH KIT ON-Q SILVERSOAK 5 (CATHETERS) IMPLANT
CATH KIT ON-Q SILVERSOAK 5IN (CATHETERS) IMPLANT
CELLS DAT CNTRL 66122 CELL SVR (MISCELLANEOUS) ×2 IMPLANT
CLIP APPLIE 11 MED OPEN (CLIP) IMPLANT
CNTNR URN SCR LID CUP LEK RST (MISCELLANEOUS) ×2 IMPLANT
CONN ST 1/4X3/8  BEN (MISCELLANEOUS) ×6
CONN ST 1/4X3/8 BEN (MISCELLANEOUS) ×4 IMPLANT
CONNECTOR 1/2X3/8X1/2 3 WAY (MISCELLANEOUS) ×3
CONNECTOR 1/2X3/8X1/2 3WAY (MISCELLANEOUS) ×2 IMPLANT
CONT SPEC 4OZ STRL OR WHT (MISCELLANEOUS) ×3
COVER BACK TABLE 24X17X13 BIG (DRAPES) ×3 IMPLANT
COVER PROBE W GEL 5X96 (DRAPES) ×3 IMPLANT
DERMABOND ADHESIVE PROPEN (GAUZE/BANDAGES/DRESSINGS) ×1
DERMABOND ADVANCED (GAUZE/BANDAGES/DRESSINGS) ×1
DERMABOND ADVANCED .7 DNX12 (GAUZE/BANDAGES/DRESSINGS) ×4 IMPLANT
DERMABOND ADVANCED .7 DNX6 (GAUZE/BANDAGES/DRESSINGS) IMPLANT
DEVICE CLOSURE PERCLS PRGLD 6F (VASCULAR PRODUCTS) ×8 IMPLANT
DEVICE SUT CK QUICK LOAD MINI (Prosthesis & Implant Heart) ×2 IMPLANT
DEVICE TROCAR PUNCTURE CLOSURE (ENDOMECHANICALS) ×3 IMPLANT
DRAIN CHANNEL 32F RND 10.7 FF (WOUND CARE) ×6 IMPLANT
DRAPE C-ARM 42X72 X-RAY (DRAPES) ×3 IMPLANT
DRAPE CV SPLIT W-CLR ANES SCRN (DRAPES) ×3 IMPLANT
DRAPE INCISE IOBAN 66X45 STRL (DRAPES) ×9 IMPLANT
DRAPE PERI GROIN 82X75IN TIB (DRAPES) ×3 IMPLANT
DRAPE SLUSH/WARMER DISC (DRAPES) ×3 IMPLANT
DRSG AQUACEL AG ADV 3.5X 6 (GAUZE/BANDAGES/DRESSINGS) ×1 IMPLANT
DRSG COVADERM 4X8 (GAUZE/BANDAGES/DRESSINGS) ×3 IMPLANT
ELECT BLADE 6.5 EXT (BLADE) ×3 IMPLANT
ELECT REM PT RETURN 9FT ADLT (ELECTROSURGICAL) ×6
ELECTRODE REM PT RTRN 9FT ADLT (ELECTROSURGICAL) ×4 IMPLANT
FELT TEFLON 1X6 (MISCELLANEOUS) ×3 IMPLANT
FEMORAL VENOUS CANN RAP (CANNULA) IMPLANT
GAUZE SPONGE 4X4 12PLY STRL LF (GAUZE/BANDAGES/DRESSINGS) ×3 IMPLANT
GLOVE BIO SURGEON STRL SZ 6.5 (GLOVE) ×6 IMPLANT
GLOVE BIO SURGEON STRL SZ7.5 (GLOVE) ×3 IMPLANT
GLOVE BIOGEL PI IND STRL 6.5 (GLOVE) IMPLANT
GLOVE BIOGEL PI INDICATOR 6.5 (GLOVE) ×2
GLOVE ORTHO TXT STRL SZ7.5 (GLOVE) ×9 IMPLANT
GOWN STRL REUS W/ TWL LRG LVL3 (GOWN DISPOSABLE) ×8 IMPLANT
GOWN STRL REUS W/TWL LRG LVL3 (GOWN DISPOSABLE) ×24
GRASPER SUT TROCAR 14GX15 (MISCELLANEOUS) ×3 IMPLANT
IV NS IRRIG 3000ML ARTHROMATIC (IV SOLUTION) ×1 IMPLANT
KIT BASIN OR (CUSTOM PROCEDURE TRAY) ×3 IMPLANT
KIT DILATOR VASC 18G NDL (KITS) ×3 IMPLANT
KIT DRAINAGE VACCUM ASSIST (KITS) ×1 IMPLANT
KIT SUCTION CATH 14FR (SUCTIONS) ×3 IMPLANT
KIT SUT CK MINI COMBO 4X17 (Prosthesis & Implant Heart) ×1 IMPLANT
KIT TURNOVER KIT B (KITS) ×3 IMPLANT
LEAD PACING MYOCARDI (MISCELLANEOUS) ×3 IMPLANT
LINE VENT (MISCELLANEOUS) ×1 IMPLANT
NDL AORTIC ROOT 14G 7F (CATHETERS) ×2 IMPLANT
NEEDLE AORTIC ROOT 14G 7F (CATHETERS) ×3 IMPLANT
NS IRRIG 1000ML POUR BTL (IV SOLUTION) ×15 IMPLANT
PACK E MIN INVASIVE VALVE (SUTURE) ×3 IMPLANT
PACK OPEN HEART (CUSTOM PROCEDURE TRAY) ×3 IMPLANT
PAD ARMBOARD 7.5X6 YLW CONV (MISCELLANEOUS) ×6 IMPLANT
PAD ELECT DEFIB RADIOL ZOLL (MISCELLANEOUS) ×3 IMPLANT
PERCLOSE PROGLIDE 6F (VASCULAR PRODUCTS) ×12
POSITIONER HEAD DONUT 9IN (MISCELLANEOUS) ×3 IMPLANT
RETRACTOR WND ALEXIS 18 MED (MISCELLANEOUS) ×2 IMPLANT
RING MITRAL MEMO 4D 32 (Prosthesis & Implant Heart) ×1 IMPLANT
RTRCTR WOUND ALEXIS 18CM MED (MISCELLANEOUS) ×3
SET CANNULATION TOURNIQUET (MISCELLANEOUS) ×3 IMPLANT
SET CARDIOPLEGIA MPS 5001102 (MISCELLANEOUS) ×1 IMPLANT
SET IRRIG TUBING LAPAROSCOPIC (IRRIGATION / IRRIGATOR) ×3 IMPLANT
SET MICROPUNCTURE 5F STIFF (MISCELLANEOUS) ×3 IMPLANT
SHEATH PINNACLE 8F 10CM (SHEATH) ×9 IMPLANT
SIZER CHORD-X CHORDAL CXCS (SIZER) ×1 IMPLANT
SLEEVE SURGEON STRL (DRAPES) ×1 IMPLANT
SOL ANTI FOG 6CC (MISCELLANEOUS) ×2 IMPLANT
SOLUTION ANTI FOG 6CC (MISCELLANEOUS) ×1
SUT BONE WAX W31G (SUTURE) ×3 IMPLANT
SUT ETHIBOND (SUTURE) ×2 IMPLANT
SUT ETHIBOND 2 0 SH (SUTURE) ×1 IMPLANT
SUT ETHIBOND 2-0 RB-1 WHT (SUTURE) ×2 IMPLANT
SUT ETHIBOND NAB MH 2-0 36IN (SUTURE) ×2 IMPLANT
SUT ETHIBOND X763 2 0 SH 1 (SUTURE) ×3 IMPLANT
SUT GORETEX CV 4 TH 22 36 (SUTURE) IMPLANT
SUT GORETEX CV-5THC-13 36IN (SUTURE) ×5 IMPLANT
SUT GORETEX CV4 TH-18 (SUTURE) IMPLANT
SUT PROLENE 3 0 SH1 36 (SUTURE) ×14 IMPLANT
SUT PTFE CHORD X 16MM (SUTURE) ×2 IMPLANT
SUT SILK  1 MH (SUTURE) ×6
SUT SILK 1 MH (SUTURE) IMPLANT
SYR 20ML LL LF (SYRINGE) ×1 IMPLANT
SYSTEM SAHARA CHEST DRAIN ATS (WOUND CARE) ×3 IMPLANT
TAPE CLOTH SURG 4X10 WHT LF (GAUZE/BANDAGES/DRESSINGS) ×1 IMPLANT
TOWEL GREEN STERILE (TOWEL DISPOSABLE) ×3 IMPLANT
TOWEL GREEN STERILE FF (TOWEL DISPOSABLE) ×3 IMPLANT
TRAY FOLEY SLVR 16FR TEMP STAT (SET/KITS/TRAYS/PACK) ×3 IMPLANT
TROCAR XCEL BLADELESS 5X75MML (TROCAR) ×3 IMPLANT
TROCAR XCEL NON-BLD 11X100MML (ENDOMECHANICALS) ×6 IMPLANT
TUBE SUCT INTRACARD DLP 20F (MISCELLANEOUS) ×3 IMPLANT
TUNNELER SHEATH ON-Q 11GX8 DSP (PAIN MANAGEMENT) IMPLANT
UNDERPAD 30X36 HEAVY ABSORB (UNDERPADS AND DIAPERS) ×3 IMPLANT
WATER STERILE IRR 1000ML POUR (IV SOLUTION) ×6 IMPLANT
WIRE EMERALD 3MM-J .035X150CM (WIRE) ×3 IMPLANT

## 2019-10-20 NOTE — Progress Notes (Signed)
  Echocardiogram Echocardiogram Transesophageal has been performed.  Bobbye Charleston 10/20/2019, 8:56 AM

## 2019-10-20 NOTE — Interval H&P Note (Signed)
History and Physical Interval Note:  10/20/2019 5:33 AM  Oscar [name]  has presented today for surgery, with the diagnosis of MR.  The various methods of treatment have been discussed with the patient and family. After consideration of risks, benefits and other options for treatment, the patient has consented to  Procedure(s): MINIMALLY INVASIVE MITRAL VALVE REPAIR (MVR) (Right) TRANSESOPHAGEAL ECHOCARDIOGRAM (TEE) (N/A) as a surgical intervention.  The patient's history has been reviewed, patient examined, no change in status, stable for surgery.  I have reviewed the patient's chart and labs.  Questions were answered to the patient's satisfaction.     Rexene Alberts

## 2019-10-20 NOTE — Anesthesia Procedure Notes (Signed)
Arterial Line Insertion Start/End6/15/2021 6:50 AM Performed by: Roderic Palau, MD, Clearnce Sorrel, CRNA, CRNA  Patient location: Pre-op. Preanesthetic checklist: patient identified, IV checked, site marked, risks and benefits discussed, surgical consent, monitors and equipment checked, pre-op evaluation, timeout performed and anesthesia consent Lidocaine 1% used for infiltration Left, radial was placed Catheter size: 20 Fr Hand hygiene performed  and maximum sterile barriers used   Attempts: 1 Procedure performed without using ultrasound guided technique. Following insertion, dressing applied and Biopatch. Post procedure assessment: normal and unchanged  Patient tolerated the procedure well with no immediate complications.

## 2019-10-20 NOTE — Progress Notes (Signed)
Patient ID: Oscar [name], male   DOB: 01-03-1956, 64 y.o.   MRN: [id number]  TCTS Evening Rounds:   Hemodynamically stable  CI = 2.6  Extubated in the OR. Awake on NRB mask.  Urine output good  CT output low  CBC    Component Value Date/Time   WBC 18.6 (H) 10/20/2019 1428   RBC 4.14 (L) 10/20/2019 1428   HGB 14.3 10/20/2019 1428   HGB 15.9 09/17/2019 0936   HCT 41.4 10/20/2019 1428   HCT 45.7 09/17/2019 0936   PLT 147 (L) 10/20/2019 1428   PLT 216 09/17/2019 0936   MCV 100.0 10/20/2019 1428   MCV 98 (H) 09/17/2019 0936   MCH 34.5 (H) 10/20/2019 1428   MCHC 34.5 10/20/2019 1428   RDW 12.4 10/20/2019 1428   RDW 12.1 09/17/2019 0936   LYMPHSABS 1.8 04/08/2019 1055   MONOABS 0.8 04/08/2019 1055   EOSABS 0.1 04/08/2019 1055   BASOSABS 0.0 04/08/2019 1055     BMET    Component Value Date/Time   NA 142 10/20/2019 1427   NA 140 09/17/2019 0936   K 4.2 10/20/2019 1427   CL 106 10/20/2019 1302   CO2 23 10/16/2019 1044   GLUCOSE 165 (H) 10/20/2019 1302   BUN 19 10/20/2019 1302   BUN 16 09/17/2019 0936   CREATININE 0.80 10/20/2019 1302   CREATININE 0.95 04/06/2016 0832   CALCIUM 9.4 10/16/2019 1044   GFRNONAA >60 10/16/2019 1044   GFRNONAA 86 04/06/2016 0832   GFRAA >60 10/16/2019 1044   GFRAA >89 04/06/2016 0832     A/P:  Stable postop course. Continue current plans

## 2019-10-20 NOTE — Anesthesia Postprocedure Evaluation (Signed)
Anesthesia Post Note  Patient: Hagen Bohorquez Petitjean  Procedure(s) Performed: MINIMALLY INVASIVE MITRAL VALVE REPAIR (MVR) (Right Chest) TRANSESOPHAGEAL ECHOCARDIOGRAM (TEE) (N/A )     Patient location during evaluation: ICU Anesthesia Type: General Level of consciousness: awake Pain management: pain level controlled Vital Signs Assessment: post-procedure vital signs reviewed and stable Respiratory status: spontaneous breathing, nonlabored ventilation, respiratory function stable and patient connected to face mask oxygen Cardiovascular status: blood pressure returned to baseline and stable Postop Assessment: no apparent nausea or vomiting Anesthetic complications: no   No complications documented.  Last Vitals:  Vitals:   10/20/19 1530 10/20/19 1545  BP:    Pulse: 79 80  Resp: 14 18  Temp: 36.9 C 36.9 C  SpO2: 95% 95%    Last Pain:  Vitals:   10/20/19 1430  TempSrc:   PainSc: 3                  Francesca Strome,W. EDMOND

## 2019-10-20 NOTE — Brief Op Note (Addendum)
10/20/2019  12:21 PM  PATIENT:  Oscar [name]  64 y.o. male  PRE-OPERATIVE DIAGNOSIS:  Mitral Regurgitation  POST-OPERATIVE DIAGNOSIS:  Mitral Regurgitation  PROCEDURE:   MINIMALLY INVASIVE MITRAL VALVE REPAIR with Placement of a 81mm Sorin Memo annuloplasty ring, placement of Gortex neo-cords x 12,  TRANSESOPHAGEAL ECHOCARDIOGRAM (TEE) (N/A)  SURGEON:   Rexene Alberts, MD - Primary  PHYSICIAN ASSISTANT: Roddenberry  ANESTHESIA:   general  EBL:  Per perfusion and anesthesia records  BLOOD ADMINISTERED:none  DRAINS: Mediastinal and right Pleural Drains   LOCAL MEDICATIONS USED:  NONE  SPECIMEN: None  DISPOSITION OF SPECIMEN:  N/A  COUNTS:  YES  DICTATION: .Dragon Dictation  PLAN OF CARE: Admit to inpatient   PATIENT DISPOSITION:  ICU - extubated and stable.   Delay start of Pharmacological VTE agent (>24hrs) due to surgical blood loss or risk of bleeding: yes

## 2019-10-20 NOTE — Op Note (Signed)
CARDIOTHORACIC SURGERY OPERATIVE NOTE  Date of Procedure:  10/20/2019  Preoperative Diagnosis: Severe Mitral Regurgitation  Postoperative Diagnosis: Same  Procedure:    Minimally-Invasive Mitral Valve Repair  Complex valvuloplasty including artificial Gore-tex neochord placement x 12  Suture plication of posterior leaflet  Sorin Memo 4D Ring Annuloplasty (size 22mm, catalog # [id number], serial # [id number])    Surgeon: Valentina Gu. Roxy Manns, MD  Assistant: Enid Cutter, PA-C  Anesthesia: Arabella Merles, MD  Operative Findings:  Fibroelastic deficiency type myxomatous degenerative disease  Multiple ruptured and elongated chordae tendinae  Large flail P2 segment and prolapsing P3 segment   Type II dysfunction with severe mitral regurgitation  Mild global left ventricular systolic dysfunction  Moderate pulmonary hypertension  No residual mitral regurgitation after successful valve repair                 BRIEF CLINICAL NOTE AND INDICATIONS FOR SURGERY  Patient is a 64 year old male with no previous cardiac history referred for surgical consultation to discuss treatment options for management of recently diagnosed mitral valve prolapse with severe symptomatic primary mitral regurgitation.  Patient states that he was just recently noted to have a heart murmur on physical exam by his primary care physician. Transthoracic echocardiogram was performed demonstrating mitral valve prolapse with at least moderate to severe mitral regurgitation. He was referred for cardiology consultation and evaluated by Dr. Davina Poke. TEE confirmed the presence of mitral valve prolapse with a large flail segment involving the P2 segment of the posterior leaflet causing severe mitral regurgitation. Diagnostic cardiac catheterization was performed and notable for the absence of significant coronary artery disease with normal right-sided pressures. The patient was referred for surgical  consultation.  The patient has been seen in consultation and counseled at length regarding the indications, risks and potential benefits of surgery.  All questions have been answered, and the patient provides full informed consent for the operation as described.    DETAILS OF THE OPERATIVE PROCEDURE  Preparation:  The patient is brought to the operating room on the above mentioned date and central monitoring was established by the anesthesia team including placement of Swan-Ganz catheter through the left internal jugular vein.  There was mild to moderate pulmonary hypertension at baseline.  A radial arterial line is placed. The patient is placed in the supine position on the operating table.  Intravenous antibiotics are administered. General endotracheal anesthesia is induced uneventfully. The patient is initially intubated using a dual lumen endotracheal tube.  A Foley catheter is placed.  Baseline transesophageal echocardiogram was performed.  Findings were notable for myxomatous degenerative disease of the mitral valve with an obvious flail segment involving the P2 segment of the posterior leaflet with multiple ruptured primary chordae tendinae and severe mitral regurgitation.  The left ventricle was mildly dilated and left ventricular systolic function was mildly depressed with ejection fraction estimated 50% in the setting of severe mitral regurgitation.  The aortic valve was normal.  The right ventricle was somewhat dilated but there was normal right ventricular systolic function.  A soft roll is placed behind the patient's left scapula and the neck gently extended and turned to the left.   The patient's right neck, chest, abdomen, both groins, and both lower extremities are prepared and draped in a sterile manner. A time out procedure is performed.   Percutaneous Vascular Access:  Percutaneous arterial and venous access were obtained on the right side.  Using ultrasound guidance the right  common femoral vein was cannulated using the Seldinger  technique a pair of Perclose vascular closure devises were placed at opposing 30 degree angles in the femoral vein, after which time an 8 French sheath inserted.  The right common femoral artery was cannulated using a micropuncture wire and sheath.  A pair of Perclose vascular closure devices were placed at opposing 30 degree angles in the femoral artery, and a 8 French sheath inserted.  The right internal jugular vein was cannulated  using ultrasound guidance and an 8 French sheath inserted.     Surgical Approach:  A right miniature anterolateral thoracotomy incision is performed. The incision is placed just lateral to and superior to the right nipple. The pectoralis major muscle is retracted medially and completely preserved. The right pleural space is entered through the 3rd intercostal space. A soft tissue retractor is placed.  Two 11 mm ports are placed through separate stab incisions inferiorly. The right pleural space is insufflated continuously with carbon dioxide gas through the posterior port during the remainder of the operation.  A pledgeted sutures placed through the dome of the right hemidiaphragm and retracted inferiorly to facilitate exposure.  A longitudinal incision is made in the pericardium 3 cm anterior to the phrenic nerve and silk traction sutures are placed on either side of the incision for exposure.   Extracorporeal Cardiopulmonary Bypass and Myocardial Protection:   The patient was heparinized systemically.  The right common femoral vein is cannulated through the venous sheath and a guidewire advanced into the right atrium using TEE guidance.  The femoral vein cannulated using a 22 Fr long femoral venous cannula.  The right common femoral artery is cannulated through the arterial sheath and a guidewire advanced into the descending thoracic aorta using TEE guidance.  Femoral artery is cannulated with a 18 French femoral  arterial cannula.  The right internal jugular vein is cannulated through the venous sheath and a guidewire advanced into the right atrium.  The internal jugular vein is cannulated using a 15 [place] pediatric femoral venous cannula.   Adequate heparinization is verified.   The entire pre-bypass portion of the operation was notable for stable hemodynamics.  Cardiopulmonary bypass was begun.  Vacuum assist venous drainage is utilized. The incision in the pericardium is extended in both directions. Venous drainage and exposure are notably excellent. A retrograde cardioplegia cannula is placed through the right atrium into the coronary sinus using transesophageal echocardiogram guidance.  An antegrade cardioplegia cannula is placed in the ascending aorta.    The patient is cooled to 32C systemic temperature.  The aortic cross clamp is applied and cardioplegia is delivered initially in an antegrade fashion through the aortic root using modified [place] cold blood cardioplegia ([place] blood cardioplegia protocol).   The initial cardioplegic arrest is rapid with early diastolic arrest.  The entire resting dose was administered antegrade through the aortic root catheter.  When cardioplegia was administered retrograde to the coronary sinus catheter initially the coronary sinus pressure rose appropriately but subsequently fell last and would not elevate, suggested that either the balloon had ruptured or the catheter had become dislodged from the coronary sinus.  Repeat doses of cardioplegia are administered at 90 minutes and every 30 minutes thereafter through the aortic root in order to maintain completely flat electrocardiogram.  Myocardial protection was felt to be excellent.   Mitral Valve Repair:  A left atriotomy incision was performed through the interatrial groove and extended partially across the back wall of the left atrium after opening the oblique sinus inferiorly.  The  mitral valve is exposed  using a self-retaining retractor.  The mitral valve was inspected and notable for fibroelastic deficiency type myxomatous degenerative disease.  There was severe prolapse with large flail segment comprising the majority of the P2 segment of the posterior leaflet with multiple ruptured primary chordae tendinae.  There was also prolapse without flail segment involving the P3 segment of the posterior leaflet.  The entire anterior leaflet was normal.  There was no annular calcification.  Interrupted 2-0 Ethibond horizontal mattress sutures are placed circumferentially around the entire mitral valve annulus. The sutures will ultimately be utilized for ring annuloplasty, and at this juncture there are utilized to suspend the valve symmetrically.  Artificial neochord placement was performed using Chord-X multi-strand CV-4 Goretex pre-measured loops.  The appropriate cord length (16 mm) was measured from corresponding normal length primary cords from the P1 segment of the posterior leaflet. The papillary muscle suture of a Chord-X multi-strand suture was placed through the head of the anterior papillary muscle in a horizontal mattress fashion and tied over Teflon felt pledgets. Each of the three pre-measured loops were then reimplanted into the free margin of the P2 segment of the posterior leaflet on the anterior side of midline.  The papillary muscle suture of a second Chord-X multi-strand suture was placed through the head of the posterior papillary muscle in a horizontal mattress fashion and tied over Teflon felt pledgets. Each of the three pre-measured loops were then reimplanted into the free margin of the P2 and P3 segments of the posterior leaflet on the posterior side of midline.     The valve was tested with saline and appeared competent even without ring annuloplasty complete. The valve was sized to a 32 mm annuloplasty ring, based upon the transverse distance between the left and right commissures and the  height of the anterior leaflet, corresponding to a size just slightly larger than the overall surface area of the anterior leaflet.  A Sorin Memo 4D annuloplasty ring (size 32 mm, catalog #[id number], serial [id number]) was secured in place uneventfully. All ring sutures were secured using a Cor-knot device.    The valve was tested with saline and appeared competent.  There remains some billowing of the P2 segment without prolapse although there is no residual leak.  The billowing was corrected using suture plication of the billowing portion of the P2 segment.  This was performed using interrupted everting CV 5 Gore-Tex suture to close the indentation or cleft between P1 and P2 M between 2023.  The valve was again tested with saline and appeared intact with no residual leak.  The previous billowing of the anterior was corrected.  There was a broad, symmetrical line of coaptation of the anterior and posterior leaflet which was confirmed using the blue ink test.  Rewarming is begun.   Procedure Completion:  The atriotomy was closed using a 2-layer closure of running 3-0 Prolene suture after placing a sump drain across the mitral valve to serve as a left ventricular vent.  One final dose of warm retrograde "reanimation dose" cardioplegia was given through the aortic root.  The aortic cross clamp was removed after a total cross clamp time of 127 minutes.  Epicardial pacing wires are fixed to the inferior wall of the right ventricule and to the right atrial appendage. The patient is rewarmed to 37C temperature. The left ventricular vent and antegrade cardioplegia cannula are removed. The patient is weaned and disconnected from cardiopulmonary bypass.  The patient's rhythm at separation from  bypass was sinus.  The patient was weaned from bypass on low dose milrinone infusion. Total cardiopulmonary bypass time for the operation was 187 minutes.  Followup transesophageal echocardiogram performed after separation from  bypass revealed  a well-seated annuloplasty ring in the mitral position with a normal functioning mitral valve. There was no residual leak.  Left ventricular function was unchanged from preoperatively.  The mean gradient across the mitral valve was estimated to be 2 mmHg.  The femoral arterial and venous cannulas were removed and all Perclose sutures secured.  Manual pressure was maintained while Protamine was administered.  The right internal jugular cannula was removed and manual pressure held on the neck and groin for 15 minutes.  Single lung ventilation was begun. The atriotomy closure was inspected for hemostasis. The pericardial sac was drained using a 32 French Bard drain placed through the anterior port incision.  The right pleural space is irrigated with saline solution and inspected for hemostasis.   A mixture of Exparel liposomal bupivacaine (20 mL) and 0.5% bupivacaine (30 mL) is utilized to create an intercostal nerve block for postoperative analgesia.  The mixture is injected under direct vision into the intercostal neurovascular bundles posteriorly to cover the second through the sixth intercostal nerve roots.  Portions of the solution are also injected into the intercostal neurovascular bundles immediately surrounding the surgical incision and immediately adjacent to the chest tube exit sites.  The right pleural space was drained using a 32 French Bard drain placed through the posterior port incision. The miniature thoracotomy incision was closed in multiple layers in routine fashion.   The post-bypass portion of the operation was notable for stable rhythm and hemodynamics.  No blood products were administered during the operation.   Disposition:  The patient tolerated the procedure well.  The patient was extubated in the operating room and subsequently transported to the surgical intensive care unit in stable condition. There were no intraoperative complications. All sponge instrument  and needle counts are verified correct at completion of the operation.     Valentina Gu. Roxy Manns MD 10/20/2019 1:43 PM

## 2019-10-20 NOTE — OR Nursing (Signed)
First call made to ICu at 1254, second call made to ICU at 1340

## 2019-10-20 NOTE — Transfer of Care (Signed)
Immediate Anesthesia Transfer of Care Note  Patient: Oscar [name]  Procedure(s) Performed: MINIMALLY INVASIVE MITRAL VALVE REPAIR (MVR) (Right Chest) TRANSESOPHAGEAL ECHOCARDIOGRAM (TEE) (N/A )  Patient Location: SICU  Anesthesia Type:General  Level of Consciousness: alert , oriented and drowsy  Airway & Oxygen Therapy: Patient Spontanous Breathing and Patient connected to face mask oxygen  Post-op Assessment: Report given to RN and Post -op Vital signs reviewed and stable  Post vital signs: Reviewed and stable  Last Vitals:  Vitals Value Taken Time  BP 135/76 10/20/19 1408  Temp 36.9 C 10/20/19 1418  Pulse 70 10/20/19 1418  Resp 12 10/20/19 1418  SpO2 100 % 10/20/19 1418  Vitals shown include unvalidated device data.  Last Pain:  Vitals:   10/20/19 0612  TempSrc:   PainSc: 0-No pain      Patients Stated Pain Goal: 5 (61/53/79 4327)  Complications: No complications documented.

## 2019-10-20 NOTE — Anesthesia Procedure Notes (Addendum)
Procedure Name: Intubation Date/Time: 10/20/2019 7:47 AM Performed by: Clearnce Sorrel, CRNA Pre-anesthesia Checklist: Patient identified, Emergency Drugs available, Suction available and Patient being monitored Patient Re-evaluated:Patient Re-evaluated prior to induction Oxygen Delivery Method: Circle System Utilized Preoxygenation: Pre-oxygenation with 100% oxygen Induction Type: IV induction Ventilation: Mask ventilation without difficulty Laryngoscope Size: Miller and 4 Grade View: Grade III Tube type: Oral Endobronchial tube: Left and Double lumen EBT and 39 Fr Number of attempts: 1 Airway Equipment and Method: Stylet and Oral airway Placement Confirmation: ETT inserted through vocal cords under direct vision,  positive ETCO2 and breath sounds checked- equal and bilateral Secured at: 29 cm Tube secured with: Tape Dental Injury: Teeth and Oropharynx as per pre-operative assessment  Comments: SRNA Doretha Sou intubated

## 2019-10-20 NOTE — Anesthesia Procedure Notes (Signed)
Central Venous Catheter Insertion Performed by: Roderic Palau, MD, anesthesiologist Start/End6/15/2021 6:45 AM, 10/20/2019 7:00 AM Patient location: Pre-op. Preanesthetic checklist: patient identified, IV checked, site marked, risks and benefits discussed, surgical consent, monitors and equipment checked, pre-op evaluation, timeout performed and anesthesia consent Hand hygiene performed  and maximum sterile barriers used  PA cath was placed.Swan type:thermodilution PA Cath depth:50 Procedure performed without using ultrasound guided technique. Attempts: 1 Patient tolerated the procedure well with no immediate complications.

## 2019-10-20 NOTE — Anesthesia Procedure Notes (Signed)
Central Venous Catheter Insertion Performed by: Roderic Palau, MD, anesthesiologist Start/End6/15/2021 6:45 AM, 10/20/2019 7:00 AM Patient location: Pre-op. Preanesthetic checklist: patient identified, IV checked, site marked, risks and benefits discussed, surgical consent, monitors and equipment checked, pre-op evaluation, timeout performed and anesthesia consent Position: Trendelenburg Lidocaine 1% used for infiltration and patient sedated Hand hygiene performed , maximum sterile barriers used  and Seldinger technique used Catheter size: 9 Fr Total catheter length 10. Central line was placed.MAC introducer Procedure performed using ultrasound guided technique. Ultrasound Notes:anatomy identified, needle tip was noted to be adjacent to the nerve/plexus identified, no ultrasound evidence of intravascular and/or intraneural injection and image(s) printed for medical record Attempts: 1 Following insertion, line sutured, dressing applied and Biopatch. Post procedure assessment: blood return through all ports, free fluid flow and no air  Patient tolerated the procedure well with no immediate complications.

## 2019-10-21 ENCOUNTER — Encounter (HOSPITAL_COMMUNITY): Payer: Self-pay | Admitting: Thoracic Surgery (Cardiothoracic Vascular Surgery)

## 2019-10-21 ENCOUNTER — Inpatient Hospital Stay (HOSPITAL_COMMUNITY): Payer: Medicare Other

## 2019-10-21 LAB — CBC
HCT: 37.4 % — ABNORMAL LOW (ref 39.0–52.0)
HCT: 38.5 % — ABNORMAL LOW (ref 39.0–52.0)
Hemoglobin: 12.8 g/dL — ABNORMAL LOW (ref 13.0–17.0)
Hemoglobin: 12.9 g/dL — ABNORMAL LOW (ref 13.0–17.0)
MCH: 34.4 pg — ABNORMAL HIGH (ref 26.0–34.0)
MCH: 34.2 pg — ABNORMAL HIGH (ref 26.0–34.0)
MCHC: 34.2 g/dL (ref 30.0–36.0)
MCHC: 33.5 g/dL (ref 30.0–36.0)
MCV: 100.5 fL — ABNORMAL HIGH (ref 80.0–100.0)
MCV: 102.1 fL — ABNORMAL HIGH (ref 80.0–100.0)
Platelets: 114 10*3/uL — ABNORMAL LOW (ref 150–400)
Platelets: 106 10*3/uL — ABNORMAL LOW (ref 150–400)
RBC: 3.72 MIL/uL — ABNORMAL LOW (ref 4.22–5.81)
RBC: 3.77 MIL/uL — ABNORMAL LOW (ref 4.22–5.81)
RDW: 12.5 % (ref 11.5–15.5)
RDW: 12.7 % (ref 11.5–15.5)
WBC: 15.5 10*3/uL — ABNORMAL HIGH (ref 4.0–10.5)
WBC: 19 10*3/uL — ABNORMAL HIGH (ref 4.0–10.5)
nRBC: 0 % (ref 0.0–0.2)
nRBC: 0 % (ref 0.0–0.2)

## 2019-10-21 LAB — COOXEMETRY PANEL
Carboxyhemoglobin: 1.4 % (ref 0.5–1.5)
Methemoglobin: 1.4 % (ref 0.0–1.5)
O2 Saturation: 71.5 %
Total hemoglobin: 12.8 g/dL (ref 12.0–16.0)

## 2019-10-21 LAB — GLUCOSE, CAPILLARY
Glucose-Capillary: 121 mg/dL — ABNORMAL HIGH (ref 70–99)
Glucose-Capillary: 121 mg/dL — ABNORMAL HIGH (ref 70–99)
Glucose-Capillary: 122 mg/dL — ABNORMAL HIGH (ref 70–99)
Glucose-Capillary: 129 mg/dL — ABNORMAL HIGH (ref 70–99)
Glucose-Capillary: 131 mg/dL — ABNORMAL HIGH (ref 70–99)
Glucose-Capillary: 132 mg/dL — ABNORMAL HIGH (ref 70–99)
Glucose-Capillary: 137 mg/dL — ABNORMAL HIGH (ref 70–99)
Glucose-Capillary: 137 mg/dL — ABNORMAL HIGH (ref 70–99)
Glucose-Capillary: 137 mg/dL — ABNORMAL HIGH (ref 70–99)
Glucose-Capillary: 139 mg/dL — ABNORMAL HIGH (ref 70–99)
Glucose-Capillary: 145 mg/dL — ABNORMAL HIGH (ref 70–99)

## 2019-10-21 LAB — BASIC METABOLIC PANEL
Anion gap: 6 (ref 5–15)
Anion gap: 7 (ref 5–15)
BUN: 16 mg/dL (ref 8–23)
BUN: 16 mg/dL (ref 8–23)
CO2: 22 mmol/L (ref 22–32)
CO2: 26 mmol/L (ref 22–32)
Calcium: 7.6 mg/dL — ABNORMAL LOW (ref 8.9–10.3)
Calcium: 7.8 mg/dL — ABNORMAL LOW (ref 8.9–10.3)
Chloride: 108 mmol/L (ref 98–111)
Chloride: 102 mmol/L (ref 98–111)
Creatinine, Ser: 1.08 mg/dL (ref 0.61–1.24)
Creatinine, Ser: 0.96 mg/dL (ref 0.61–1.24)
GFR calc Af Amer: >60 mL/min (ref >60)
GFR calc Af Amer: >60 mL/min (ref >60)
GFR calc non Af Amer: >60 mL/min (ref >60)
GFR calc non Af Amer: >60 mL/min (ref >60)
Glucose, Bld: 136 mg/dL — ABNORMAL HIGH (ref 70–99)
Glucose, Bld: 132 mg/dL — ABNORMAL HIGH (ref 70–99)
Potassium: 4.3 mmol/L (ref 3.5–5.1)
Potassium: 4.3 mmol/L (ref 3.5–5.1)
Sodium: 136 mmol/L (ref 135–145)
Sodium: 135 mmol/L (ref 135–145)

## 2019-10-21 LAB — MAGNESIUM
Magnesium: 2.2 mg/dL (ref 1.7–2.4)
Magnesium: 2.1 mg/dL (ref 1.7–2.4)

## 2019-10-21 MED ORDER — WARFARIN - PHYSICIAN DOSING INPATIENT: Freq: Every day | Status: DC

## 2019-10-21 MED ORDER — KETOROLAC TROMETHAMINE 15 MG/ML IJ SOLN
15.0000 mg | Freq: Four times a day (QID) | INTRAMUSCULAR | Status: AC
  Administered 2019-10-21 – 2019-10-22 (×5): 15 mg via INTRAVENOUS
  Filled 2019-10-21 (×4): qty 1

## 2019-10-21 MED ORDER — ENOXAPARIN SODIUM 40 MG/0.4ML ~~LOC~~ SOLN
40.0000 mg | Freq: Every day | SUBCUTANEOUS | Status: DC
  Administered 2019-10-22 – 2019-10-23 (×2): 40 mg via SUBCUTANEOUS
  Filled 2019-10-21 (×2): qty 0.4

## 2019-10-21 MED ORDER — ROSUVASTATIN CALCIUM 20 MG PO TABS
20.0000 mg | ORAL_TABLET | Freq: Every day | ORAL | Status: DC
  Administered 2019-10-24: 20 mg via ORAL
  Filled 2019-10-21: qty 1

## 2019-10-21 MED ORDER — INSULIN ASPART 100 UNIT/ML ~~LOC~~ SOLN
0.0000 [IU] | SUBCUTANEOUS | Status: DC
  Administered 2019-10-22 (×2): 2 [IU] via SUBCUTANEOUS

## 2019-10-21 MED ORDER — WARFARIN SODIUM 2.5 MG PO TABS
2.5000 mg | ORAL_TABLET | Freq: Every day | ORAL | Status: DC
  Administered 2019-10-21 – 2019-10-23 (×3): 2.5 mg via ORAL
  Filled 2019-10-21 (×3): qty 1

## 2019-10-21 MED ORDER — KETOROLAC TROMETHAMINE 15 MG/ML IJ SOLN
15.0000 mg | Freq: Four times a day (QID) | INTRAMUSCULAR | Status: DC
  Filled 2019-10-21: qty 1

## 2019-10-21 MED ORDER — CLONAZEPAM 0.5 MG PO TABS
0.5000 mg | ORAL_TABLET | Freq: Every day | ORAL | Status: DC
  Administered 2019-10-21 – 2019-10-23 (×3): 0.5 mg via ORAL
  Filled 2019-10-21 (×3): qty 1

## 2019-10-21 MED ORDER — METOCLOPRAMIDE HCL 5 MG/ML IJ SOLN
10.0000 mg | Freq: Four times a day (QID) | INTRAMUSCULAR | Status: AC
  Administered 2019-10-21 – 2019-10-22 (×4): 10 mg via INTRAVENOUS
  Filled 2019-10-21 (×4): qty 2

## 2019-10-21 MED ORDER — ASPIRIN EC 325 MG PO TBEC
325.0000 mg | DELAYED_RELEASE_TABLET | Freq: Every day | ORAL | Status: AC
  Administered 2019-10-21: 325 mg via ORAL
  Filled 2019-10-21: qty 1

## 2019-10-21 MED ORDER — FUROSEMIDE 10 MG/ML IJ SOLN
20.0000 mg | Freq: Four times a day (QID) | INTRAMUSCULAR | Status: AC
  Administered 2019-10-21 (×3): 20 mg via INTRAVENOUS
  Filled 2019-10-21 (×3): qty 2

## 2019-10-21 MED ORDER — ASPIRIN EC 81 MG PO TBEC
81.0000 mg | DELAYED_RELEASE_TABLET | Freq: Every day | ORAL | Status: DC
  Administered 2019-10-22 – 2019-10-24 (×3): 81 mg via ORAL
  Filled 2019-10-21 (×3): qty 1

## 2019-10-21 MED FILL — Heparin Sodium (Porcine) Inj 1000 Unit/ML: INTRAMUSCULAR | Qty: 30 | Status: AC

## 2019-10-21 MED FILL — Potassium Chloride Inj 2 mEq/ML: INTRAVENOUS | Qty: 40 | Status: AC

## 2019-10-21 MED FILL — Lidocaine HCl Local Preservative Free (PF) Inj 2%: INTRAMUSCULAR | Qty: 15 | Status: AC

## 2019-10-21 NOTE — Addendum Note (Signed)
Addendum  created 10/21/19 0841 by Josephine Igo, CRNA   Order list changed

## 2019-10-21 NOTE — Progress Notes (Signed)
Patient ID: Oscar [name], male   DOB: 1955/12/21, 64 y.o.   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  360   I.V. (mL/kg) 3922.3 (32.8) 143.9 (1.2)   Blood 537    IV Piggyback 996    Total Intake(mL/kg) 5455.3 (45.6) 503.9 (4.2)   Urine (mL/kg/hr) 2165 (0.8) 1100 (0.8)   Chest Tube 400 200   Total Output 2565 1300   Net +2890.3 -796.1          . sodium chloride    . cefUROXime (ZINACEF)  IV 1.5 g (10/21/19 1756)  . insulin Stopped (10/21/19 1248)  . lactated ringers    . lactated ringers Stopped (10/21/19 1248)  . milrinone Stopped (10/21/19 1127)     Lab Results  Component Value Date   WBC 19.0 (H) 10/21/2019   HGB 12.9 (L) 10/21/2019   HCT 38.5 (L) 10/21/2019   PLT 106 (L) 10/21/2019   GLUCOSE 132 (H) 10/21/2019   CHOL 240 (H) 04/08/2019   TRIG 146.0 04/08/2019   HDL 58.50 04/08/2019   LDLCALC 152 (H) 04/08/2019   ALT 36 10/16/2019   AST 23 10/16/2019   NA 135 10/21/2019   K 4.3 10/21/2019   CL 102 10/21/2019   CREATININE 0.96 10/21/2019   BUN 16 10/21/2019   CO2 26 10/21/2019   TSH 1.84 04/08/2019   PSA 3.39 04/08/2019   INR 1.3 (H) 10/20/2019   HGBA1C 5.8 (H) 10/16/2019   Up in chair Stable day, co nausea  Alfonzo Feller Redmond MD  Beeper [phone] Office [phone] 10/21/2019 6:00 PM

## 2019-10-21 NOTE — Progress Notes (Addendum)
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  Awake and alert this AM. Notices chest soreness with breathing and also complains of numbness in his right hand.   Currently on milrinone at 0.174mcg/kg/min.  Objective: Vital signs in last 24 hours: Temp:  [98.1 F (36.7 C)-99.7 F (37.6 C)] 98.1 F (36.7 C) (06/16 0723) Pulse Rate:  [39-82] 81 (06/16 0700) Cardiac Rhythm: Atrial paced (06/16 0000) Resp:  [12-24] 22 (06/16 0700) BP: (93-135)/(58-78) 105/66 (06/16 0700) SpO2:  [90 %-99 %] 96 % (06/16 0700) Arterial Line BP: (95-133)/(50-69) 110/60 (06/16 0600) Weight:  [119.7 kg] 119.7 kg (06/16 0600)  Filed Weights   10/20/19 0551 10/21/19 0600  Weight: 110.7 kg 119.7 kg    Weight change: 9.022 kg   Hemodynamic parameters for last 24 hours: PAP: (15-50)/(10-31) 50/31 CO:  [4.3 L/min-7.8 L/min] 4.6 L/min CI:  [1.8 L/min/m2-3.3 L/min/m2] 1.9 L/min/m2  Intake/Output from previous day: 06/15 0701 - 06/16 0700 In: 5455.3 [I.V.:3922.3; Blood:537; IV Piggyback:996] Out: 2565 [Urine:2165; Chest Tube:400]  Intake/Output this shift: No intake/output data recorded.  Current Meds: Scheduled Meds: . acetaminophen  1,000 mg Oral Q6H  . aspirin EC  325 mg Oral Daily  . [START ON 10/22/2019] aspirin EC  81 mg Oral Daily  . bisacodyl  10 mg Oral Daily   Or  . bisacodyl  10 mg Rectal Daily  . Chlorhexidine Gluconate Cloth  6 each Topical Daily  . clonazePAM  0.5 mg Oral QHS  . docusate sodium  200 mg Oral Daily  . [START ON 10/22/2019] enoxaparin (LOVENOX) injection  40 mg Subcutaneous QHS  . furosemide  20 mg Intravenous Q6H  . insulin aspart  0-24 Units Subcutaneous Q4H    . mouth rinse  15 mL Mouth Rinse BID  . [START ON 10/22/2019] pantoprazole  40 mg Oral Daily  . [START ON 10/24/2019] rosuvastatin  20 mg Oral Daily  . sodium chloride flush  10-40 mL Intracatheter Q12H  . sodium chloride flush  3 mL Intravenous Q12H  . warfarin  2.5 mg Oral q1600  . Warfarin - Physician Dosing Inpatient   Does not apply q1600   Continuous Infusions: . sodium chloride    . albumin human 12.5 g (10/20/19 1732)  . cefUROXime (ZINACEF)  IV Stopped (10/21/19 9983)  . insulin 2.2 mL/hr at 10/21/19 0700  . lactated ringers    . lactated ringers 20 mL/hr at 10/21/19 0700  . milrinone 0.25 mcg/kg/min (10/21/19 0700)   PRN Meds:.albumin human, dextrose, metoprolol tartrate, morphine injection, ondansetron (ZOFRAN) IV, oxyCODONE, pneumococcal 23 valent vaccine, sodium chloride flush, sodium chloride flush, traMADol  General appearance: alert, cooperative and mild distress Neurologic: has reduced sensation on the dorsal surface of the right hand. Grip strength is also diminished but strength at right elbow and shoulder is near normal. Otherwise intact neurologically. Heart: Currently a-paced. Has intrinsic SR at [place] on EKG this AM.  Lungs: Breath sounds are clear anterior.Chest tube drainage 211ml past 12 hours. Abdomen: soft, non-tender, absent bowel sounds.  Extremities: All well perfused  with palpable pulses.  Wound: The right chest incision is covered with a dry, intact dressing.   Lab Results: CBC: Recent Labs    10/20/19 2024 10/21/19 0336  WBC 16.2* 15.5*  HGB 13.1 12.8*  HCT 39.2 37.4*  PLT 129* 114*   BMET:  Recent Labs    10/20/19 2024 10/21/19 0336  NA 140 136  K 4.5 4.3  CL 111 108  CO2 22 22  GLUCOSE 141* 136*  BUN 17 16  CREATININE 0.84 1.08  CALCIUM 7.5* 7.6*    CMET: Lab Results  Component Value Date   WBC 15.5 (H) 10/21/2019   HGB 12.8 (L) 10/21/2019   HCT 37.4 (L) 10/21/2019   PLT 114 (L) 10/21/2019   GLUCOSE 136 (H) 10/21/2019    CHOL 240 (H) 04/08/2019   TRIG 146.0 04/08/2019   HDL 58.50 04/08/2019   LDLCALC 152 (H) 04/08/2019   ALT 36 10/16/2019   AST 23 10/16/2019   NA 136 10/21/2019   K 4.3 10/21/2019   CL 108 10/21/2019   CREATININE 1.08 10/21/2019   BUN 16 10/21/2019   CO2 22 10/21/2019   TSH 1.84 04/08/2019   PSA 3.39 04/08/2019   INR 1.3 (H) 10/20/2019   HGBA1C 5.8 (H) 10/16/2019      PT/INR:  Recent Labs    10/20/19 1428  LABPROT 15.3*  INR 1.3*   Radiology: [facility] Chest Port 1 View  Result Date: 10/20/2019 CLINICAL DATA:  Status post mitral valve repair. EXAM: PORTABLE CHEST 1 VIEW COMPARISON:  October 16, 2019. FINDINGS: Stable cardiomediastinal silhouette. Right-sided chest tube is noted without definite pneumothorax. Mild bibasilar subsegmental atelectasis is noted. No pleural effusion is noted. Bony thorax is unremarkable. IMPRESSION: Right-sided chest tube is noted without definite pneumothorax. Mild bibasilar subsegmental atelectasis is noted. Electronically Signed   By: Marijo Conception M.D.   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 Hemodynamically stable since surgery. Weaning milrinone. D/C PA catheter and arterial line. Mobilize. Initiate Coumadin. Leave chest tubes.   -Volume excess- Wt ~9kg + from pre-op. Diurese as tolerated.   -Endo- no prior h/o diabetes, pre-op A1C 5.8. Glucose well controlled on insulin drip since surgery, transition to SSI today.   -Right hand numbness and weakness--suspect this is from local nerve compression. Observe for now.   -DVT PPX- advance activity and begin enoxaparin later today.   -History of insomnia- takes Klonopin routinely at bedtime, this has been resumed.    Antony Odea, PA-C [phone] 10/21/2019 7:53 AM   I have seen and examined the patient and agree with the  assessment and plan as outlined.  Looks good POD1.  Maintaining NSR w/ stable hemodynamics on very low dose milrinone.  Mobilize.  Diuresis.  Wean milrinone off.  Start Coumadin  Rexene Alberts, MD 10/21/2019 8:51 AM

## 2019-10-21 NOTE — Plan of Care (Signed)
  Problem: Education: Goal: Knowledge of the prescribed therapeutic regimen will improve Outcome: Progressing   Problem: Cardiac: Goal: Will achieve and/or maintain hemodynamic stability Outcome: Progressing   Problem: Respiratory: Goal: Respiratory status will improve Outcome: Progressing   Problem: Urinary Elimination: Goal: Ability to achieve and maintain adequate renal perfusion and functioning will improve Outcome: Progressing

## 2019-10-22 ENCOUNTER — Inpatient Hospital Stay (HOSPITAL_COMMUNITY): Payer: Medicare Other

## 2019-10-22 ENCOUNTER — Other Ambulatory Visit: Payer: Self-pay | Admitting: Medical

## 2019-10-22 DIAGNOSIS — I340 Nonrheumatic mitral (valve) insufficiency: Secondary | ICD-10-CM

## 2019-10-22 LAB — CBC
HCT: 38.3 % — ABNORMAL LOW (ref 39.0–52.0)
Hemoglobin: 12.5 g/dL — ABNORMAL LOW (ref 13.0–17.0)
MCH: 34 pg (ref 26.0–34.0)
MCHC: 32.6 g/dL (ref 30.0–36.0)
MCV: 104.1 fL — ABNORMAL HIGH (ref 80.0–100.0)
Platelets: 107 10*3/uL — ABNORMAL LOW (ref 150–400)
RBC: 3.68 MIL/uL — ABNORMAL LOW (ref 4.22–5.81)
RDW: 12.9 % (ref 11.5–15.5)
WBC: 16.8 10*3/uL — ABNORMAL HIGH (ref 4.0–10.5)
nRBC: 0 % (ref 0.0–0.2)

## 2019-10-22 LAB — BASIC METABOLIC PANEL
Anion gap: 9 (ref 5–15)
BUN: 19 mg/dL (ref 8–23)
CO2: 27 mmol/L (ref 22–32)
Calcium: 8.1 mg/dL — ABNORMAL LOW (ref 8.9–10.3)
Chloride: 98 mmol/L (ref 98–111)
Creatinine, Ser: 0.82 mg/dL (ref 0.61–1.24)
GFR calc Af Amer: >60 mL/min (ref >60)
GFR calc non Af Amer: >60 mL/min (ref >60)
Glucose, Bld: 126 mg/dL — ABNORMAL HIGH (ref 70–99)
Potassium: 4.4 mmol/L (ref 3.5–5.1)
Sodium: 134 mmol/L — ABNORMAL LOW (ref 135–145)

## 2019-10-22 LAB — PROTIME-INR
INR: 1 (ref 0.8–1.2)
Prothrombin Time: 13.1 seconds (ref 11.4–15.2)

## 2019-10-22 MED ORDER — FUROSEMIDE 40 MG PO TABS
40.0000 mg | ORAL_TABLET | Freq: Two times a day (BID) | ORAL | Status: DC
  Administered 2019-10-22 – 2019-10-24 (×5): 40 mg via ORAL
  Filled 2019-10-22 (×5): qty 1

## 2019-10-22 MED ORDER — TRAZODONE HCL 50 MG PO TABS
50.0000 mg | ORAL_TABLET | Freq: Every day | ORAL | Status: DC
  Administered 2019-10-22 – 2019-10-23 (×2): 50 mg via ORAL
  Filled 2019-10-22 (×2): qty 1

## 2019-10-22 MED ORDER — METOPROLOL TARTRATE 12.5 MG HALF TABLET
12.5000 mg | ORAL_TABLET | Freq: Two times a day (BID) | ORAL | Status: DC
  Administered 2019-10-22 – 2019-10-24 (×5): 12.5 mg via ORAL
  Filled 2019-10-22 (×5): qty 1

## 2019-10-22 MED ORDER — ~~LOC~~ CARDIAC SURGERY, PATIENT & FAMILY EDUCATION: Freq: Once | Status: DC

## 2019-10-22 MED ORDER — POTASSIUM CHLORIDE CRYS ER 20 MEQ PO TBCR
20.0000 meq | EXTENDED_RELEASE_TABLET | Freq: Two times a day (BID) | ORAL | Status: DC
  Administered 2019-10-22 – 2019-10-24 (×5): 20 meq via ORAL
  Filled 2019-10-22 (×5): qty 1

## 2019-10-22 MED FILL — Sodium Bicarbonate IV Soln 8.4%: INTRAVENOUS | Qty: 50 | Status: AC

## 2019-10-22 MED FILL — Sodium Chloride IV Soln 0.9%: INTRAVENOUS | Qty: 3000 | Status: AC

## 2019-10-22 MED FILL — Heparin Sodium (Porcine) Inj 1000 Unit/ML: INTRAMUSCULAR | Qty: 20 | Status: AC

## 2019-10-22 MED FILL — Electrolyte-R (PH 7.4) Solution: INTRAVENOUS | Qty: 5000 | Status: AC

## 2019-10-22 MED FILL — Mannitol IV Soln 20%: INTRAVENOUS | Qty: 500 | Status: AC

## 2019-10-22 NOTE — Discharge Summary (Addendum)
Physician Discharge Summary  Patient ID: Oscar [name] MRN: [id number] DOB/AGE: 10/12/1955 64 y.o.  Admit date: 10/20/2019 Discharge date: 10/24/2019  Admission Diagnoses:  Severe mitral insufficiency H/O Dyslipidemia H/O Benign prostatic hyperplasia   Discharge Diagnoses:   Severe mitral insufficiency S/P minimally-invasive mitral valve repair H/O Dyslipidemia H/O Benign prostatic hyperplasia  Discharged Condition: good  History of Present Illness:  Patient is a 64 year old male with no previous cardiac history referred for surgical consultation to discuss treatment options for management of recently diagnosed mitral valve prolapse with severe symptomatic primary mitral regurgitation.  Patient states that he was just recently noted to have a heart murmur on physical exam by his primary care physician. Transthoracic echocardiogram was performed demonstrating mitral valve prolapse with at least moderate to severe mitral regurgitation. He was referred for cardiology consultation and evaluated by Dr. Davina Poke. TEE confirmed the presence of mitral valve prolapse with a large flail segment involving the P2 segment of the posterior leaflet causing severe mitral regurgitation. Diagnostic cardiac catheterization was performed and notable for the absence of significant coronary artery disease with normal right-sided pressures. The patient was referred for surgical consultation.  Patient is married and [place] locally [place] the country near [place]. He has been retired for approximately 1-1/2 years having previously worked for [employer] for many years. He has remained physically active throughout his adult life and into retirement. The patient and his wife live on a large parcel of land and he spends a great deal of time working outdoors. He enjoys hunting and golf. He has noticed a tendency to get short of breath and tired with exertion. Breathing has gotten considerably worse over the  last few months. He initially attributed this to presumed asthma and has sought relief by sticking his head into the freezer to inhale cold air. He has some tightness across his chest when he feels short of breath. He has not had palpitations, dizzy spells, nor syncope. He gets short of breath with moderate level activity. He denies orthopnea or PND. He reports increasing persistent dry nonproductive cough.  Patient was originally seen in consultation on Sep 25, 2019 at which time we made plans for elective mitral valve repair. The patient reports no new problems or complaints over the last few weeks. He does admit that his symptoms seem to be progressing and that he has been getting short of breath more more often with physical activity. He continues to deny resting shortness of breath. He has not had PND, orthopnea, or lower extremity edema. He has a slight cough. He has not had fevers or chills. He otherwise remains well.   Hospital Course:   Oscar [name] was admitted on 10/20/2019.Marland Kitchen  He mitral valve repair was accomplished of the septic knee.  This required complex valvuloplasty including artificial Gore-Tex neocords x12 suture posterior no fluid.  A Sorin memo 4D annuloplasty ring was also placed.  Please see his operative note for details.  Following the procedure, patient was extubated in the operating room and transferred to the cardiovascular ICU.  He remained hemodynamically stable on low-dose milrinone.  The milrinone was weaned and  discontinued on the first postop.  He was mobilized and made excellent progress with ambulation.  By the second postoperative day, his heart rate 75/min.  The pacemaker was discontinued. He was transferred to progressive care. He was anticoagulated with Coumadin and low-dose metoprolol was also initiated. On the floor he continued to progress. He had some de-sating overnight and we ordered a pulse overnight  but it wasn't done. He however, did not have any  episodes last night and is ready for discharge today.    Consults: None  Significant Diagnostic Studies:  ECHOCARDIOGRAM REPORT       Patient Name:  Oscar [name] Date of Exam: 04/10/2019  Medical Rec #: [id number]     Height:    74.0 in  Accession #:  [id number]    Weight:    252.0 lb  Date of Birth: [birth year]     BSA:     2.40 m  Patient Age:  71 years     BP:      140/78 mmHg  Patient Gender: M         HR:      84 bpm.  Exam Location: [place]   Procedure: 2D Echo, Color Doppler and Cardiac Doppler   Indications:  R01.1 Murmur    History:    Patient has no prior history of Echocardiogram  examinations.         Risk Factors:Dyslipidemia.    Sonographer:  Jessee Avers, RDCS  Referring Phys: [id number] [place]    1. The mitral valve is abnormal. Prolapse of the posterior leaflet. There  is eccentric anterior directed jet. Appears to be moderate mitral  regurgitation, though could be underestimating given eccentric jet. Normal  left atrial size and E wave <1.2  argues against severe MR, though does have mild LV dilatation of unclear  cause. Comparing MV annular inflow to LVOT SV, the regurgitant volume is  50cc (RF 34%), suggestive of moderate MR. If clinical suspicion for severe  MR, consider cardiac MRI or TEE  for further evaluation.  2. Left ventricular ejection fraction, by visual estimation, is 60 to  65%. The left ventricle has normal function. There is no left ventricular  hypertrophy.  3. Mildly dilated left ventricular internal cavity size.  4. Global right ventricle has normal systolic function.The right  ventricular size is normal. No increase in right ventricular wall  thickness.  5. Left atrial size was normal.  6. Right atrial size was normal.  7. The tricuspid valve is normal in structure. Tricuspid valve  regurgitation is not demonstrated.    8. The aortic valve is tricuspid. Aortic valve regurgitation is not  visualized.  9. The pulmonic valve was not well visualized. Pulmonic valve  regurgitation is not visualized.  10. There is mild dilatation of the ascending aorta measuring 36 mm.  11. TR signal is inadequate for assessing pulmonary artery systolic  pressure.   FINDINGS  Left Ventricle: Left ventricular ejection fraction, by visual estimation,  is 60 to 65%. The left ventricle has normal function. The left ventricular  internal cavity size was mildly dilated left ventricle. There is no left  ventricular hypertrophy. Left  ventricular diastolic parameters were normal.   Right Ventricle: The right ventricular size is normal. No increase in  right ventricular wall thickness. Global RV systolic function is has  normal systolic function.   Left Atrium: Left atrial size was normal in size.   Right Atrium: Right atrial size was normal in size   Pericardium: There is no evidence of pericardial effusion.   Mitral Valve: The mitral valve is abnormal. Moderate mitral valve  regurgitation, with eccentric anteriorly directed jet.   Tricuspid Valve: The tricuspid valve is normal in structure. Tricuspid  valve regurgitation is not demonstrated.   Aortic Valve: The aortic valve is tricuspid. Aortic valve regurgitation is  not visualized.  Pulmonic Valve: The pulmonic valve was not well visualized. Pulmonic valve  regurgitation is not visualized.   Aorta: Aortic dilatation noted. There is mild dilatation of the ascending  aorta measuring 36 mm.   IAS/Shunts: The interatrial septum was not well visualized.     LEFT VENTRICLE  PLAX 2D  LVIDd:     5.90 cm Diastology  LVIDs:     3.90 cm LV e' lateral:  15.60 cm/s  LV PW:     1.00 cm LV E/e' lateral: 6.8  LV IVS:    0.80 cm LV e' medial:  9.32 cm/s  LVOT diam:   2.60 cm LV E/e' medial: 11.4  LV SV:     107 ml  LV SV Index:  43.38   LVOT Area:   5.31 cm     RIGHT VENTRICLE  RV Basal diam: 3.00 cm  RV S prime:   6.57 cm/s   LEFT ATRIUM       Index    RIGHT ATRIUM      Index  LA diam:    4.50 cm 1.88 cm/m RA Area:   12.00 cm  LA Vol (A2C):  47.6 ml 19.85 ml/m RA Volume:  28.90 ml 12.05 ml/m  LA Vol (A4C):  48.0 ml 20.02 ml/m  LA Biplane Vol: 48.0 ml 20.02 ml/m  AORTIC VALVE  LVOT Vmax:  108.00 cm/s  LVOT Vmean: 74.800 cm/s  LVOT VTI:  0.186 m    AORTA  Ao Root diam: 3.20 cm  Ao Asc diam: 3.60 cm   MITRAL VALVE                    SHUNTS                    Systemic VTI: 0.19 m  MV Decel Time: 155 msec       Systemic Diam: 2.60 cm  MV E velocity: 106.00 cm/s 103 cm/s  MV A velocity: 63.50 cm/s 70.3 cm/s  MV E/A ratio: 1.67    1.5     Oswaldo Milian MD  Electronically signed by Oswaldo Milian MD  Signature Date/Time: 04/10/2019/6:49:02 PM      TRANSESOPHOGEAL ECHO REPORT       Patient Name:  Oscar [name] Date of Exam: 09/16/2019  Medical Rec #: [id number]     Height:    74.0 in  Accession #:  [id number]    Weight:    247.0 lb  Date of Birth: 03-30-1956     BSA:     2.377 m  Patient Age:  37 years     BP:      137/110 mmHg  Patient Gender: M         HR:      78 bpm.  Exam Location: Outpatient   Procedure: 3D Echo, Transesophageal Echo, Cardiac Doppler, Color Doppler  and       Saline Contrast Bubble Study   Indications:  mitral regurgitation    History:    Patient has prior history of Echocardiogram examinations,  most         recent 04/10/2019.    Sonographer:  Johny Chess  Referring Phys: [id number] [place]   PROCEDURE: After discussion of the risks and benefits of a TEE, an  informed consent was obtained from the patient. TEE procedure time was 25  minutes. The transesophogeal  probe was passed without difficulty through  the esophogus of the patient. Local  oropharyngeal anesthetic was  provided with viscous lidocaine. Sedation  performed by different physician. The patient was monitored while under  deep sedation. Anesthestetic sedation was provided intravenously by  Anesthesiology: 527mg  of Propofol. Image  quality was excellent. The patient's vital signs; including heart rate,  blood pressure, and oxygen saturation; remained stable throughout the  procedure. The patient developed no complications during the procedure.   IMPRESSIONS    1. There is severe prolapse of the P2 segment with a fail chord. There is  moderate P3 prolapse. There is severe MR with an eccentric jet directed  anteriorly with coanda effect. There is systolic pulmonary vein flow  reversal in the left upper pulmonary  vein. 2D PISA radius is 1.3 cm, 2D ERO 0.74 cm2, R vol 121 cc. 3D ERO 0.75  cm2. Flail width 14 mm. Flail gap 5.2 mm. PMVL length 19 mm. Overall,  findings consistent with severe mitral regurgitation. The mitral valve is  myxomatous. Severe mitral valve  regurgitation. No evidence of mitral stenosis.  2. Left ventricular ejection fraction, by estimation, is 65 to 70%. Left  ventricular ejection fraction by 3D volume is 71 %. The left ventricle has  normal function. The left ventricle has no regional wall motion  abnormalities.  3. Right ventricular systolic function is normal. The right ventricular  size is normal.  4. Left atrial size was mildly dilated. No left atrial/left atrial  appendage thrombus was detected. The LAA emptying velocity was 65 cm/s.  5. There appears to be a flail cord of the septal leflet. There is mild  TR associated with this. The tricuspid valve is myxomatous.  6. The aortic valve is tricuspid. Aortic valve regurgitation is not  visualized. No aortic stenosis is present.  7. There is mild (Grade II) layered plaque involving the ascending  aorta,  transverse aorta and descending aorta.  8. Agitated saline contrast bubble study was negative, with no evidence  of any interatrial shunt.   FINDINGS  Left Ventricle: Left ventricular ejection fraction, by estimation, is 65  to 70%. Left ventricular ejection fraction by 3D volume is 71 %. The left  ventricle has normal function. The left ventricle has no regional wall  motion abnormalities. The left  ventricular internal cavity size was normal in size. There is no left  ventricular hypertrophy.   Right Ventricle: The right ventricular size is normal. No increase in  right ventricular wall thickness. Right ventricular systolic function is  normal.   Left Atrium: Left atrial size was mildly dilated. No left atrial/left  atrial appendage thrombus was detected. The LAA emptying velocity was 65  cm/s.   Right Atrium: Right atrial size was normal in size.   Pericardium: There is no evidence of pericardial effusion.   Mitral Valve: There is severe prolapse of the P2 segment with a fail  chord. There is moderate P3 prolapse. There is severe MR with an eccentric  jet directed anteriorly with coanda effect. There is systolic pulmonary  vein flow reversal in the left upper  pulmonary vein. 2D PISA radius is 1.3 cm, 2D ERO 0.74 cm2, R vol 121 cc.  3D ERO 0.75 cm2. Flail width 14 mm. Flail gap 5.2 mm. PMVL length 19 mm.  Overall, findings consistent with severe mitral regurgitation. The mitral  valve is myxomatous. Severe mitral  valve regurgitation, with anteriorly-directed jet. No evidence of mitral  valve stenosis.   Tricuspid Valve: There appears to be a flail cord of the septal leflet.  There is mild TR associated with  this. The tricuspid valve is myxomatous.  Tricuspid valve regurgitation is mild . No evidence of tricuspid stenosis.   Aortic Valve: The aortic valve is tricuspid. Aortic valve regurgitation is  not visualized. No aortic stenosis is present.   Pulmonic  Valve: The pulmonic valve was normal in structure. Pulmonic valve  regurgitation is not visualized. No evidence of pulmonic stenosis.   Aorta: The aortic root and ascending aorta are structurally normal, with  no evidence of dilitation. There is mild (Grade II) layered plaque  involving the ascending aorta, transverse aorta and descending aorta.   Venous: The left upper pulmonary vein is normal. A pattern of systolic  flow reversal, suggestive of severe mitral regurgitation is recorded from  the left upper pulmonary vein.   IAS/Shunts: No atrial level shunt detected by color flow Doppler. Agitated  saline contrast was given intravenously to evaluate for intracardiac  shunting. Agitated saline contrast bubble study was negative, with no  evidence of any interatrial shunt.       3D Volume EF  LV 3D EF:  Left ventricular ejection fraction by 3D volume is 71        %.    3D Volume EF  LV 3D EF:  71.30 %  LV 3D EDV:  139100.00 mm  LV 3D ESV:  39900.00 mm  LV 3D SV:  99300.00 mm    3D Volume EF:  3D EF:    71 %     AORTA  Ao Root diam: 3.10 cm  Ao Asc diam: 3.09 cm   MR Peak grad:  121.9 mmHg TRICUSPID VALVE  MR Mean grad:  87.0 mmHg  TR Peak grad:  24.0 mmHg  MR Vmax:     552.00 cm/s TR Vmax:    245.00 cm/s  MR Vmean:    444.0 cm/s  MR PISA:     10.62 cm  MR PISA Eff ROA: 74 mm  MR PISA Radius: 1.30 cm   Eleonore Chiquito MD  Electronically signed by Eleonore Chiquito MD  Signature Date/Time: 09/16/2019/1:09:30 PM      RIGHT/LEFT HEART CATH AND CORONARY ANGIOGRAPHY  Conclusion  Conclusions: 1. Mild ostial D1 narrowing (~20%). Otherwise, no angiographically significant coronary artery disease. 2. Upper normal to mildly elevated left and right heart filling pressures. 3. Low normal Fick cardiac output/index.  Recommendations: 1. Ongoing management of mitral regurgitation per Drs. O'Neal and Roxy Manns. 2. Primary  prevention of coronary artery disease.  Nelva Bush, MD [facility]  Recommendations  Antiplatelet/Anticoag No indication for antiplatelet therapy at this time .  Discharge Date In the absence of any other complications or medical issues, we expect the patient to be ready for discharge on 09/21/2019.  Surgeon Notes    09/21/2019 9:40 PM Brief Op Note addendum by Nelva Bush, MD    09/16/2019 9:48 AM CV Procedure signed by Geralynn Rile, MD  Indications  Nonrheumatic mitral valve regurgitation [I34.0 (ICD-10-CM)]  Procedural Details  Technical Details Indication: 64 y.o. year-old man with history of hypertension and severe mitral regurgitation with flair P2 segment, presenting for preoperative evaluation in anticipation of mitral valve repair.  GFR: >60 ml/min  Procedure: The risks, benefits, complications, treatment options, and expected outcomes were discussed with the patient. The patient and/or family concurred with the proposed plan, giving informed consent. The patient was sedated with IV midazolam and fentanyl. The right wrist was assessed with a modified Allens test which was normal. The right wrist and elbow were prepped and draped in a  sterile fashion. 1% lidocaine was used for local anesthesia. A previously placed antecubital vein IV was exchanged for a 43F slender Glidesheath using modified Seldinger technique. Right heart catheterization was performed by advancing a 43F balloon-tipped catheter through the right heart chambers into the pulmonary capillary wedge position. Pressure measurements and oxygen saturations were obtained.  Using the modified Seldinger access technique, a 6415F slender Glidesheath was placed in the right radial artery. 3 mg Verapamil was given through the sheath. Heparin 5,000 units were administered.  Selective coronary angiography was performed using 43F JL3.5 and AR2 catheters to engage the left and right coronary arteries,  respectively. Left heart catheterization was performed using a 43F JR4 catheter. Left ventriculogram was not performed.  At the end of the procedure, the radial artery sheath was removed and a TR band applied to achieve patent hemostasis. There were no immediate complications. The patient was taken to the recovery area in stable condition.  Estimated blood loss <50 mL.   During this procedure medications were administered to achieve and maintain moderate conscious sedation while the patient's heart rate, blood pressure, and oxygen saturation were continuously monitored and I was present face-to-face 100% of this time.  Medications (Filter: Administrations occurring from 09/21/19 1015 to 09/21/19 1114) Heparin (Porcine) in NaCl 1000-0.9 UT/500ML-% SOLN (mL) Total volume: 1,000 mL Date/Time  Rate/Dose/Volume Action  09/21/19 1031  500 mL Given  1032  500 mL Given    fentaNYL (SUBLIMAZE) injection (mcg) Total dose: 50 mcg Date/Time  Rate/Dose/Volume Action  09/21/19 1033  25 mcg Given  1045  25 mcg Given    midazolam (VERSED) injection (mg) Total dose: 2 mg Date/Time  Rate/Dose/Volume Action  09/21/19 1033  1 mg Given  1045  1 mg Given    lidocaine (PF) (XYLOCAINE) 1 % injection (mL) Total volume: 4 mL Date/Time  Rate/Dose/Volume Action  09/21/19 1041  2 mL Given  1045  2 mL Given    Radial Cocktail/Verapamil only (mL) Total volume: 10 mL Date/Time  Rate/Dose/Volume Action  09/21/19 1050  10 mL Given    heparin sodium (porcine) injection (Units) Total dose: 5,000 Units Date/Time  Rate/Dose/Volume Action  09/21/19 1053  5,000 Units Given    iohexol (OMNIPAQUE) 350 MG/ML injection (mL) Total volume: 75 mL Date/Time  Rate/Dose/Volume Action  09/21/19 1111  75 mL Given    Sedation Time  Sedation Time Physician-1: 35 minutes 50 seconds  Contrast  Medication Name Total Dose  iohexol (OMNIPAQUE) 350 MG/ML injection 75 mL     Radiation/Fluoro  Fluoro time: 5.7 (min) DAP: 32075 (mGycm2) Cumulative Air Kerma: 852 (mGy)  Complications  Complications documented before study signed (09/21/2019 7:78 PM)   No complications were associated with this study.  Documented by Nelva Bush, MD - 09/21/2019 9:37 PM    Coronary Findings  Diagnostic Dominance: Right Left Main  Vessel is large. Vessel is angiographically normal.  Left Anterior Descending  First Diagonal Branch  Vessel is moderate in size.  1st Diag lesion 20% stenosed  1st Diag lesion is 20% stenosed.  Second Diagonal Branch  Vessel is small in size.  Left Circumflex  Vessel is large. Vessel is angiographically normal.  First Obtuse Marginal Branch  Vessel is small in size.  Second Obtuse Marginal Branch  Vessel is large in size.  Third Obtuse Marginal Branch  Vessel is small in size.  Right Coronary Artery  Vessel is large. Vessel is angiographically normal.  Right Posterior Descending Artery  Vessel is small in size.  Right Posterior Atrioventricular Artery  Vessel is moderate in size.  Intervention  No interventions have been documented. Right Heart  Right Heart Pressures RA (mean): 5 mmHg RV (S/EDP): 30/7 mmHg PA (S/D, mean): 30/15 (20) mmHg PCWP (mean): 15 mmHg  Ao sat: 98% PA sat: 72%  Fick CO: 5.6 L/min Fick CI: 2.4 L/min/m^2  Left Heart  Left Ventricle LV end diastolic pressure is mildly elevated. LVEDP 15-20 mmHg.  Aortic Valve There is no aortic valve stenosis.  Coronary Diagrams  Diagnostic Dominance: Right    Treatments:   CARDIOTHORACIC SURGERY OPERATIVE NOTE  Date of Procedure:                10/20/2019  Preoperative Diagnosis:      Severe Mitral Regurgitation  Postoperative Diagnosis:    Same  Procedure:        Minimally-Invasive Mitral Valve Repair             Complex valvuloplasty including artificial Gore-tex neochord placement x 12             Suture plication of posterior  leaflet             Sorin Memo 4D Ring Annuloplasty (size 48mm, catalog # [id number], serial # [id number])               Surgeon:        Valentina Gu. Roxy Manns, MD  Assistant:       Enid Cutter, PA-C  Anesthesia:    Arabella Merles, MD  Operative Findings: ? Fibroelastic deficiency type myxomatous degenerative disease ? Multiple ruptured and elongated chordae tendinae ? Large flail P2 segment and prolapsing P3 segment  ? Type II dysfunction with severe mitral regurgitation ? Mild global left ventricular systolic dysfunction ? Moderate pulmonary hypertension ? No residual mitral regurgitation after successful valve repair                 BRIEF CLINICAL NOTE AND INDICATIONS FOR SURGERY  Patient is a 64 year old male with no previous cardiac history referred for surgical consultation to discuss treatment options for management of recently diagnosed mitral valve prolapse with severe symptomatic primary mitral regurgitation.  Patient states that he was just recently noted to have a heart murmur on physical exam by his primary care physician. Transthoracic echocardiogram was performed demonstrating mitral valve prolapse with at least moderate to severe mitral regurgitation. He was referred for cardiology consultation and evaluated by Dr. Davina Poke. TEE confirmed the presence of mitral valve prolapse with a large flail segment involving the P2 segment of the posterior leaflet causing severe mitral regurgitation. Diagnostic cardiac catheterization was performed and notable for the absence of significant coronary artery disease with normal right-sided pressures. The patient was referred for surgical consultation.  The patient has been seen in consultation and counseled at length regarding the indications, risks and potential benefits of surgery.  All questions have been answered, and the patient provides full informed consent for the operation as  described.   Discharge Exam: Blood pressure 115/71, pulse 78, temperature 98.8 F (37.1 C), temperature source Oral, resp. rate 16, height 6\' 2"  (1.88 m), weight 114.1 kg, SpO2 94 %.  General appearance: alert, cooperative and no distress Heart: regular rate and rhythm, S1, S2 normal, no murmur, click, rub or gallop Lungs: clear to auscultation bilaterally and diminished in the lower lobes Abdomen: soft, non-tender; bowel sounds normal; no masses,  no organomegaly Extremities: extremities normal, atraumatic, no cyanosis or edema Wound: clean and dry  Disposition: Discharge disposition: 01-Home or Self Care       Discharge Instructions    Amb Referral to Cardiac Rehabilitation   Complete by: As directed    Will send Cardiac Rehab Phase 2 referral to [facility]   Diagnosis: Valve Repair   Valve: Mitral   After initial evaluation and assessments completed: Virtual Based Care may be provided alone or in conjunction with Phase 2 Cardiac Rehab based on patient barriers.: Yes     Allergies as of 10/24/2019      Reactions   Poison Ivy Treatments [poison Ivy Treatments]    Allergic to poison ivy   Pollen Extract       Medication List    STOP taking these medications   ibuprofen 200 MG tablet Commonly known as: ADVIL   SAW PALMETTO PO     TAKE these medications   acetaminophen 500 MG tablet Commonly known as: TYLENOL Take 2 tablets (1,000 mg total) by mouth every 6 (six) hours.   aspirin 81 MG EC tablet Take 1 tablet (81 mg total) by mouth daily. Swallow whole.   clonazePAM 0.5 MG tablet Commonly known as: KLONOPIN TAKE 1 TABLET BY MOUTH  DAILY AT BEDTIME What changed: medication strength   fluticasone 50 MCG/ACT nasal spray Commonly known as: FLONASE Place 2 sprays into the nose 2 (two) times daily.   furosemide 40 MG tablet Commonly known as: LASIX Take 1 tablet (40 mg total) by mouth daily for 3 days.   metoprolol tartrate 25 MG tablet Commonly known as:  LOPRESSOR Take 0.5 tablets (12.5 mg total) by mouth 2 (two) times daily.   multivitamin tablet Take 1 tablet by mouth daily.   ondansetron 4 MG tablet Commonly known as: Zofran Take 1 tablet (4 mg total) by mouth every 8 (eight) hours as needed for nausea or vomiting.   oxyCODONE 5 MG immediate release tablet Commonly known as: Oxy IR/ROXICODONE Take 1 tablet (5 mg total) by mouth every 6 (six) hours as needed for severe pain.   potassium chloride SA 20 MEQ tablet Commonly known as: KLOR-CON Take 1 tablet (20 mEq total) by mouth daily for 3 days.   rosuvastatin 20 MG tablet Commonly known as: CRESTOR Take 1 tablet (20 mg total) by mouth daily.   traZODone 50 MG tablet Commonly known as: DESYREL Take one pill at night for sleep What changed: medication strength   warfarin 2.5 MG tablet Commonly known as: COUMADIN Take 1 tablet (2.5 mg total) by mouth daily at 4 PM.       Follow-up Information    [facility] Follow up on 10/30/2019.   Specialty: Cardiology Why: @ 10:30 for coumadin clinic  Contact information: [address] [place] [phone]       Orma Flaming, MD. Call in 1 day(s).   Specialty: Family Medicine Contact information: [place] [address] [place], Cassie Freer, MD .   Specialties: Internal Medicine, Cardiology, Radiology Contact information: [place] [address] [phone]        Rexene Alberts, MD Follow up.   Specialty: Cardiothoracic Surgery Why: Your routine follow-up appointment is on 11/16/2019 at 4:30. Please arrive at 4:00pm for a chest xray located at [facility] which is on the first floor of our building.  Contact information: [place] [address] [phone]        chest tube  sutures removal Follow up.   Why: Your chest tube suture appointment is on 6/25 at  11:00 Contact information: Dr. Guy Sandifer office              Signed: Elgie Collard, PA-C 10/24/2019, 12:26 PM

## 2019-10-22 NOTE — Discharge Instructions (Addendum)
Discharge Instructions:  1. You may shower, please wash incisions daily with soap and water and keep dry.  If you wish to cover wounds with dressing you may do so but please keep clean and change daily.  No tub baths or swimming until incisions have completely healed.  If your incisions become red or develop any drainage please call our office at [phone]  2. No Driving until cleared by Dr. Guy Sandifer office and you are no longer using narcotic pain medications  3. Monitor your weight daily.. Please use the same scale and weigh at same time... If you gain 3-5 lbs in 48 hours with associated lower extremity swelling, please contact our office at [phone]  4. Fever of 101.5 for at least 24 hours with no source, please contact our office at [phone]  5. Activity- up as tolerated, please walk at least 3 times per day.  Avoid strenuous activity, no lifting, pushing, or pulling with your arms over 8-10 lbs for a minimum of 6 weeks  6. If any questions or concerns arise, please do not hesitate to contact our office at [phone]  **Please follow these instructions until at least your follow-up appointment with Dr. Roxy Manns.   ------------------------------------------------- Information on my medicine - Coumadin   (Warfarin)  Why was Coumadin prescribed for you? Coumadin was prescribed for you because you have a blood clot or a medical condition that can cause an increased risk of forming blood clots. Blood clots can cause serious health problems by blocking the flow of blood to the heart, lung, or brain. Coumadin can prevent harmful blood clots from forming. As a reminder your indication for Coumadin is:   Blood Clot Prevention After Heart Valve Surgery  What test will check on my response to Coumadin? While on Coumadin (warfarin) you will need to have an INR test regularly to ensure that your dose is keeping you in the desired range. The INR (international normalized ratio) number is  calculated from the result of the laboratory test called prothrombin time (PT).  If an INR APPOINTMENT HAS NOT ALREADY BEEN MADE FOR YOU please schedule an appointment to have this lab work done by your health care provider within 7 days. Your INR goal is usually a number between:  2 to 3 or your provider may give you a more narrow range like 2-2.5.  Ask your health care provider during an office visit what your goal INR is.  What  do you need to  know  About  COUMADIN? Take Coumadin (warfarin) exactly as prescribed by your healthcare provider about the same time each day.  DO NOT stop taking without talking to the doctor who prescribed the medication.  Stopping without other blood clot prevention medication to take the place of Coumadin may increase your risk of developing a new clot or stroke.  Get refills before you run out.  What do you do if you miss a dose? If you miss a dose, take it as soon as you remember on the same day then continue your regularly scheduled regimen the next day.  Do not take two doses of Coumadin at the same time.  Important Safety Information A possible side effect of Coumadin (Warfarin) is an increased risk of bleeding. You should call your healthcare provider right away if you experience any of the following: ? Bleeding from an injury or your nose that does not stop. ? Unusual colored urine (red or dark brown) or unusual colored stools (red or black). ? Unusual  bruising for unknown reasons. ? A serious fall or if you hit your head (even if there is no bleeding).  Some foods or medicines interact with Coumadin (warfarin) and might alter your response to warfarin. To help avoid this: ? Eat a balanced diet, maintaining a consistent amount of Vitamin K. ? Notify your provider about major diet changes you plan to make. ? Avoid alcohol or limit your intake to 1 drink for women and 2 drinks for men per day. (1 drink is 5 oz. wine, 12 oz. beer, or 1.5 oz.  liquor.)  Make sure that ANY health care provider who prescribes medication for you knows that you are taking Coumadin (warfarin).  Also make sure the healthcare provider who is monitoring your Coumadin knows when you have started a new medication including herbals and non-prescription products.  Coumadin (Warfarin)  Major Drug Interactions  Increased Warfarin Effect Decreased Warfarin Effect  Alcohol (large quantities) Antibiotics (esp. Septra/Bactrim, Flagyl, Cipro) Amiodarone (Cordarone) Aspirin (ASA) Cimetidine (Tagamet) Megestrol (Megace) NSAIDs (ibuprofen, naproxen, etc.) Piroxicam (Feldene) Propafenone (Rythmol SR) Propranolol (Inderal) Isoniazid (INH) Posaconazole (Noxafil) Barbiturates (Phenobarbital) Carbamazepine (Tegretol) Chlordiazepoxide (Librium) Cholestyramine (Questran) Griseofulvin Oral Contraceptives Rifampin Sucralfate (Carafate) Vitamin K   Coumadin (Warfarin) Major Herbal Interactions  Increased Warfarin Effect Decreased Warfarin Effect  Garlic Ginseng Ginkgo biloba Coenzyme Q10 Green tea St. John's wort    Coumadin (Warfarin) FOOD Interactions  Eat a consistent number of servings per week of foods HIGH in Vitamin K (1 serving =  cup)  Collards (cooked, or boiled & drained) Kale (cooked, or boiled & drained) Mustard greens (cooked, or boiled & drained) Parsley *serving size only =  cup Spinach (cooked, or boiled & drained) Swiss chard (cooked, or boiled & drained) Turnip greens (cooked, or boiled & drained)  Eat a consistent number of servings per week of foods MEDIUM-HIGH in Vitamin K (1 serving = 1 cup)  Asparagus (cooked, or boiled & drained) Broccoli (cooked, boiled & drained, or raw & chopped) Brussel sprouts (cooked, or boiled & drained) *serving size only =  cup Lettuce, raw (green leaf, endive, romaine) Spinach, raw Turnip greens, raw & chopped   These websites have more information on Coumadin (warfarin):   FailFactory.se; VeganReport.com.au;

## 2019-10-22 NOTE — Hospital Course (Signed)
Mr. 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  This required complex valvuloplasty including artificial Gore-Tex neocords x12 suture posterior no fluid.  A Sorin memo 4D annuloplasty ring was also placed.  Please see his operative note for details.  Following the procedure, patient was extubated in the operating room and transferred to the cardiovascular ICU.  He remained hemodynamically stable on low-dose milrinone.  The milrinone was weaned and  discontinued on the first postop.  He was mobilized and made excellent progress with ambulation.  By the second postoperative day, his heart rate 75/min.  The pacemaker was discontinued. He was transferred to progressive care. He was anticoagulated with Coumadin and low-dose metoprolol was also initiated.

## 2019-10-22 NOTE — Progress Notes (Signed)
CARDIAC REHAB PHASE I   Went to offer to walk with pt, pt ambulating with RN. Encouraged continued ambulation and IS use. Will continue to follow.  Rufina Falco, RN BSN 10/22/2019 2:12 PM

## 2019-10-22 NOTE — Progress Notes (Signed)
Pt received from [place] to 4E16. Oriented to room and call bell. CHG bath completed. Tele box connected to patient, CCMD called. VSS. Call bell in reach. Will continue to monitor.  Arletta Bale, RN

## 2019-10-22 NOTE — Progress Notes (Addendum)
[facility] DAILY ICU PROGRESS NOTE                   [address]            [place] [address]          [phone]   2 Days Post-Op Procedure(s) (LRB): MINIMALLY INVASIVE MITRAL VALVE REPAIR (MVR) (Right) TRANSESOPHAGEAL ECHOCARDIOGRAM (TEE) (N/A)  Total Length of Stay:  LOS: 2 days   Subjective: Up in the bedside chair. Feels better, nausea resolved. Tolerating clear liquids. Says right hand numbness has improved.  Made 2 laps in the hall this morning.  Passing gas, no BM yet.   Objective: Vital signs in last 24 hours: Temp:  [97.8 F (36.6 C)-98.7 F (37.1 C)] 97.8 F (36.6 C) (06/17 0333) Pulse Rate:  [78-81] 81 (06/17 0600) Cardiac Rhythm: Atrial paced (06/16 2000) Resp:  [7-31] 7 (06/17 0600) BP: (105-131)/(57-84) 129/79 (06/17 0600) SpO2:  [92 %-100 %] 97 % (06/17 0600) Weight:  [118.8 kg] 118.8 kg (06/17 0500)  Filed Weights   10/20/19 0551 10/21/19 0600 10/22/19 0500  Weight: 110.7 kg 119.7 kg 118.8 kg    Weight change: -0.9 kg     Intake/Output from previous day: 06/16 0701 - 06/17 0700 In: 604 [P.O.:360; I.V.:143.9; IV Piggyback:100.1] Out: 2550 [Urine:2100; Chest Tube:450]  Intake/Output this shift: No intake/output data recorded.  Current Meds: Scheduled Meds: . acetaminophen  1,000 mg Oral Q6H  . aspirin EC  81 mg Oral Daily  . bisacodyl  10 mg Oral Daily   Or  . bisacodyl  10 mg Rectal Daily  . Chlorhexidine Gluconate Cloth  6 each Topical Daily  . clonazePAM  0.5 mg Oral QHS  .  Cardiac Surgery, Patient & Family Education   Does not apply Once  . docusate sodium  200 mg Oral Daily  . enoxaparin (LOVENOX) injection  40 mg Subcutaneous QHS  . furosemide  40 mg Oral BID  . ketorolac  15 mg Intravenous Q6H  . mouth rinse  15 mL Mouth Rinse BID  . metoCLOPramide (REGLAN) injection  10 mg Intravenous Q6H  . pantoprazole  40 mg Oral Daily  . potassium chloride  20 mEq Oral BID  . [START ON 10/24/2019] rosuvastatin  20 mg  Oral Daily  . sodium chloride flush  10-40 mL Intracatheter Q12H  . sodium chloride flush  3 mL Intravenous Q12H  . warfarin  2.5 mg Oral q1600  . Warfarin - Physician Dosing Inpatient   Does not apply q1600   Continuous Infusions: . sodium chloride    . insulin Stopped (10/21/19 1248)  . lactated ringers    . lactated ringers Stopped (10/21/19 1248)   PRN Meds:.dextrose, metoprolol tartrate, morphine injection, ondansetron (ZOFRAN) IV, oxyCODONE, pneumococcal 23 valent vaccine, sodium chloride flush, sodium chloride flush, traMADol   General appearance: alert, cooperative and mild distress Neurologic: improved sensation on the dorsal surface of the right hand. Grip strength improved.  Otherwise intact neurologically. Heart: NSR at 75/min, rare PVC. Pacer turned off.  Lungs: Breath sounds are clear anterior.Chest tube drainage 169ml past 12 hours. Abdomen: soft, non-tender, few bowel sounds.  Extremities: All well perfused with palpable pulses.  Wound: The right chest incision is covered with a dry, intact dressing.   Lab Results: CBC: Recent Labs    10/21/19 1622 10/22/19 0610  WBC 19.0* 16.8*  HGB 12.9* 12.5*  HCT 38.5* 38.3*  PLT 106* 107*   BMET:  Recent Labs  10/21/19 1622 10/22/19 0610  NA 135 134*  K 4.3 4.4  CL 102 98  CO2 26 27  GLUCOSE 132* 126*  BUN 16 19  CREATININE 0.96 0.82  CALCIUM 7.8* 8.1*    CMET: Lab Results  Component Value Date   WBC 16.8 (H) 10/22/2019   HGB 12.5 (L) 10/22/2019   HCT 38.3 (L) 10/22/2019   PLT 107 (L) 10/22/2019   GLUCOSE 126 (H) 10/22/2019   CHOL 240 (H) 04/08/2019   TRIG 146.0 04/08/2019   HDL 58.50 04/08/2019   LDLCALC 152 (H) 04/08/2019   ALT 36 10/16/2019   AST 23 10/16/2019   NA 134 (L) 10/22/2019   K 4.4 10/22/2019   CL 98 10/22/2019   CREATININE 0.82 10/22/2019   BUN 19 10/22/2019   CO2 27 10/22/2019   TSH 1.84 04/08/2019   PSA 3.39 04/08/2019   INR 1.0 10/22/2019   HGBA1C 5.8 (H) 10/16/2019       PT/INR:  Recent Labs    10/22/19 0610  LABPROT 13.1  INR 1.0   Radiology: No results found.   Assessment/Plan: S/P Procedure(s) (LRB): MINIMALLY INVASIVE MITRAL VALVE REPAIR (MVR) (Right) TRANSESOPHAGEAL ECHOCARDIOGRAM (TEE) (N/A)  -POD-2 minimally invasive MV repair. Hemodynamically stable since surgery. Off milrinone. D/C pacer. .D/C Foley catheter, leave CTs another day.  Advance activity. Continue to anticoagulate with Coumadin. Initiate metoprolol.  Transfer to 4E.  -Volume excess- 2.5L output yesterday but Wt ~8kg + from pre-op. Continue diuresis.  -Endo- no prior h/o diabetes, pre-op A1C 5.8. Glucose well controlled,  continue SSI .   -Right hand numbness and weakness--suspect this is from local nerve compression and is improving. Continue to observe.   -DVT PPX- continue,  enoxaparin later today.   -History of insomnia- takes Klonopin routinely at bedtime, this has been resumed.    Antony Odea, PA-C [phone] 10/22/2019 7:32 AM    I have seen and examined the patient and agree with the assessment and plan as outlined.  Doing very well.  Mobilize.  Diuresis.  Leave chest tubes in one more day.  Transfer 4E  Rexene Alberts, MD 10/22/2019 8:06 AM

## 2019-10-23 ENCOUNTER — Ambulatory Visit: Payer: Medicare Other | Admitting: Cardiovascular Disease

## 2019-10-23 LAB — BASIC METABOLIC PANEL
Anion gap: 9 (ref 5–15)
BUN: 16 mg/dL (ref 8–23)
CO2: 31 mmol/L (ref 22–32)
Calcium: 8.4 mg/dL — ABNORMAL LOW (ref 8.9–10.3)
Chloride: 96 mmol/L — ABNORMAL LOW (ref 98–111)
Creatinine, Ser: 1.02 mg/dL (ref 0.61–1.24)
GFR calc Af Amer: >60 mL/min (ref >60)
GFR calc non Af Amer: >60 mL/min (ref >60)
Glucose, Bld: 111 mg/dL — ABNORMAL HIGH (ref 70–99)
Potassium: 4 mmol/L (ref 3.5–5.1)
Sodium: 136 mmol/L (ref 135–145)

## 2019-10-23 LAB — CBC
HCT: 37.1 % — ABNORMAL LOW (ref 39.0–52.0)
Hemoglobin: 12.3 g/dL — ABNORMAL LOW (ref 13.0–17.0)
MCH: 33.7 pg (ref 26.0–34.0)
MCHC: 33.2 g/dL (ref 30.0–36.0)
MCV: 101.6 fL — ABNORMAL HIGH (ref 80.0–100.0)
Platelets: 113 10*3/uL — ABNORMAL LOW (ref 150–400)
RBC: 3.65 MIL/uL — ABNORMAL LOW (ref 4.22–5.81)
RDW: 12.7 % (ref 11.5–15.5)
WBC: 14 10*3/uL — ABNORMAL HIGH (ref 4.0–10.5)
nRBC: 0 % (ref 0.0–0.2)

## 2019-10-23 LAB — GLUCOSE, CAPILLARY
Glucose-Capillary: 121 mg/dL — ABNORMAL HIGH (ref 70–99)
Glucose-Capillary: 124 mg/dL — ABNORMAL HIGH (ref 70–99)
Glucose-Capillary: 138 mg/dL — ABNORMAL HIGH (ref 70–99)

## 2019-10-23 LAB — PROTIME-INR
INR: 1 (ref 0.8–1.2)
Prothrombin Time: 13.1 seconds (ref 11.4–15.2)

## 2019-10-23 NOTE — Progress Notes (Signed)
CARDIAC REHAB PHASE I   Went to offer to walk with pt. EPW recently d/c'd will f/u later to encourage ambulation.  Rufina Falco, RN BSN 10/23/2019 11:07 AM

## 2019-10-23 NOTE — Progress Notes (Addendum)
      [address]       [place] [address]             [phone]        3 Days Post-Op   Procedure(s) (LRB): MINIMALLY INVASIVE MITRAL VALVE REPAIR (MVR) (Right) TRANSESOPHAGEAL ECHOCARDIOGRAM (TEE) (N/A) Subjective: Feels good except for soreness at the chest tube site. Progressing well with ambulation. Good sats on RA while awake but continues to desat while sleeping.  Objective: Vital signs in last 24 hours: Temp:  [98.4 F (36.9 C)-99 F (37.2 C)] 98.8 F (37.1 C) (06/18 0827) Pulse Rate:  [65-101] 79 (06/18 0827) Cardiac Rhythm: Normal sinus rhythm (06/18 0827) Resp:  [9-22] 20 (06/18 0827) BP: (108-138)/(66-77) 138/69 (06/18 0827) SpO2:  [67 %-100 %] 91 % (06/18 0827) Weight:  [116.9 kg] 116.9 kg (06/18 0504)     Intake/Output from previous day: 06/17 0701 - 06/18 0700 In: 1100 [P.O.:1000; IV Piggyback:100] Out: 2550 [Urine:2250; Chest Tube:300] Intake/Output this shift: No intake/output data recorded.  General appearance:alert, cooperative and mild distress Neurologic:improved sensation on the dorsal surface of the right hand. Grip strength continues to improve.  Otherwise intact neurologically. Heart:NSR at 75/min, no significant arrhythmias on monitor review. .  Lungs:Breath sounds are clear anterior.Chest tube drainage is this serous and tapering off.  Abdomen:soft, non-tender, few bowel sounds. Extremities:All well perfused with palpable pulses. Wound:The right chest incision is covered with a dry, intact dressing.   Lab Results: Recent Labs    10/22/19 0610 10/23/19 0317  WBC 16.8* 14.0*  HGB 12.5* 12.3*  HCT 38.3* 37.1*  PLT 107* 113*   BMET:  Recent Labs    10/22/19 0610 10/23/19 0317  NA 134* 136  K 4.4 4.0  CL 98 96*  CO2 27 31  GLUCOSE 126* 111*  BUN 19 16  CREATININE 0.82 1.02  CALCIUM 8.1* 8.4*    PT/INR:  Recent Labs    10/23/19 0317  LABPROT 13.1  INR 1.0   ABG    Component Value  Date/Time   PHART 7.278 (L) 10/20/2019 1717   HCO3 21.8 10/20/2019 1717   TCO2 23 10/20/2019 1717   ACIDBASEDEF 5.0 (H) 10/20/2019 1717   O2SAT 71.5 10/21/2019 0336   CBG (last 3)  Recent Labs    10/21/19 1126 10/21/19 1243 10/21/19 1538  GLUCAP 139* 132* 122*    Assessment/Plan: S/P Procedure(s) (LRB): MINIMALLY INVASIVE MITRAL VALVE REPAIR (MVR) (Right) TRANSESOPHAGEAL ECHOCARDIOGRAM (TEE) (N/A)  -POD-3 minimally invasive MV repair.Progressing well. . D/C pacer wires and chest tubes.  Continue to anticoagulate with Coumadin, INR 1.0  -Volume excess- 2.5L output again yesterday and Wt is trending down appropriately.  Continue diuresis.  K+4.0  -Endo- no prior h/o diabetes, pre-op A1C 5.8. Glucose well controlled,  continue SSI .   -Right hand numbness and weakness--continues to improve each day.   -DVT PPX- continue enoxaparin   -History of insomnia- takes Klonopin routinely at bedtime, this has been resumed.  -Anticipate discharge 1-2 days.   LOS: 3 days    Antony Odea, PA-C [phone] 10/23/2019   I have seen and examined the patient and agree with the assessment and plan as outlined.  Doing very well.  D/C pacing wires and chest tubes.  Possibly ready for d/c home tomorrow.  Continue diuresis.  Rexene Alberts, MD 10/23/2019 9:45 AM

## 2019-10-23 NOTE — Progress Notes (Addendum)
Pt's chest tubes removed per MD order.  Pt tolerated well.  No complications to report at this time.  Pt will be on bed rest and vitals checked per protocol.  Pt will continue to be monitored.

## 2019-10-23 NOTE — Progress Notes (Signed)
Pt's EPW removed today per MD order.  Pt tolerated well.  Small portion of internal suture noted on one wire.  PA notified and came to room to inspect. PA recommended two hours bed rest with Q15 vitals and to monitor for any signs of bleeding.  Pt currently doing well and all vitals stable.  Pt will continue to be closely monitored.

## 2019-10-23 NOTE — Plan of Care (Signed)
Care Plan reviewed. Pt's been progressing.  Problem: Cardiac: Goal: Will achieve and/or maintain hemodynamic stability Outcome: Progressing. NSR on monitor. He's hemodynamically stable. BP within normal limits.   Problem: Clinical Measurements: Goal: Will remain free from infection Outcome: Progressing: remained afebrile. Right chest incision dressing dry and clean. Chest tube with -20 cmH2o suction, no air leak, no subcutaneous emphysema, drainage appeared serosanguinously, no active drainage.     Problem: Clinical Measurements: Goal: Respiratory complications will improve Outcome: On room air SPO2 83-85% when Pt took O2 NCL out at night. Continue 2-3 LPM of O2 NCL, able to maintain SPO2 92-98%. Encouraged breathing exercise with incentive spirometer.  Auscultated right lung diminished breath sound, left lung clear.   Problem: Activity: Goal: Risk for activity intolerance will decrease Outcome: Progressing: able to ambulate with one standby assist. Tolerated well.    Problem: Elimination: Goal: Will not experience complications related to bowel motility Outcome: Progressing. Pt stated last BM on 6/ 14/ 21. Laxative and stool softener given. Hypoactive bowel sound. Stated positive flatus.   Problem: Pain Managment: Goal: General experience of comfort will improve Outcome: Progressing: pain tolerated well. Able to rest well tonight. Oxycodone and Tylenol given as needed.     No immediate distress noted. We will continue to monitor.   Kennyth Lose, RN

## 2019-10-23 NOTE — Progress Notes (Signed)
CARDIAC REHAB PHASE I   PRE:  Rate/Rhythm: 75 SR  BP:  Sitting: 140/78      SaO2: 97 RA  MODE:  Ambulation: 800 ft   POST:  Rate/Rhythm: 96 SR  BP:  Sitting: 142/73    SaO2: 91 RA  Pt ambulated 867ft in hallway independently with front wheel walker. Pt denies dizziness or SOB. D/c education reviewed with pt. Pt and wife educated on importance of site care and monitoring incision daily. Encouraged continued IS use and walks. Reviewed restrictions and exercise guidelines. Will refer to [facility] [place].   [phone] Rufina Falco, RN BSN 10/23/2019 2:04 PM

## 2019-10-24 ENCOUNTER — Inpatient Hospital Stay (HOSPITAL_COMMUNITY): Payer: Medicare Other

## 2019-10-24 LAB — CBC
HCT: 35.6 % — ABNORMAL LOW (ref 39.0–52.0)
Hemoglobin: 12 g/dL — ABNORMAL LOW (ref 13.0–17.0)
MCH: 34 pg (ref 26.0–34.0)
MCHC: 33.7 g/dL (ref 30.0–36.0)
MCV: 100.8 fL — ABNORMAL HIGH (ref 80.0–100.0)
Platelets: 149 10*3/uL — ABNORMAL LOW (ref 150–400)
RBC: 3.53 MIL/uL — ABNORMAL LOW (ref 4.22–5.81)
RDW: 12.4 % (ref 11.5–15.5)
WBC: 10.4 10*3/uL (ref 4.0–10.5)
nRBC: 0 % (ref 0.0–0.2)

## 2019-10-24 LAB — PROTIME-INR
INR: 1.1 (ref 0.8–1.2)
Prothrombin Time: 13.4 seconds (ref 11.4–15.2)

## 2019-10-24 LAB — BASIC METABOLIC PANEL
Anion gap: 8 (ref 5–15)
BUN: 15 mg/dL (ref 8–23)
CO2: 32 mmol/L (ref 22–32)
Calcium: 8.5 mg/dL — ABNORMAL LOW (ref 8.9–10.3)
Chloride: 97 mmol/L — ABNORMAL LOW (ref 98–111)
Creatinine, Ser: 0.95 mg/dL (ref 0.61–1.24)
GFR calc Af Amer: >60 mL/min (ref >60)
GFR calc non Af Amer: >60 mL/min (ref >60)
Glucose, Bld: 104 mg/dL — ABNORMAL HIGH (ref 70–99)
Potassium: 3.8 mmol/L (ref 3.5–5.1)
Sodium: 137 mmol/L (ref 135–145)

## 2019-10-24 MED ORDER — METOPROLOL TARTRATE 25 MG PO TABS: 12.5000 mg | ORAL_TABLET | Freq: Two times a day (BID) | ORAL | 1 refills | Status: DC

## 2019-10-24 MED ORDER — ASPIRIN 81 MG PO TBEC: 81.0000 mg | DELAYED_RELEASE_TABLET | Freq: Every day | ORAL | 11 refills | Status: DC

## 2019-10-24 MED ORDER — FUROSEMIDE 40 MG PO TABS: 40.0000 mg | ORAL_TABLET | Freq: Every day | ORAL | 0 refills | Status: DC

## 2019-10-24 MED ORDER — TRAZODONE HCL 50 MG PO TABS: ORAL_TABLET | ORAL | 1 refills | Status: DC

## 2019-10-24 MED ORDER — OXYCODONE HCL 5 MG PO TABS: 5.0000 mg | ORAL_TABLET | Freq: Four times a day (QID) | ORAL | 0 refills | Status: DC | PRN

## 2019-10-24 MED ORDER — POTASSIUM CHLORIDE CRYS ER 20 MEQ PO TBCR: 20.0000 meq | EXTENDED_RELEASE_TABLET | Freq: Every day | ORAL | 0 refills | Status: DC

## 2019-10-24 MED ORDER — WARFARIN SODIUM 2.5 MG PO TABS: 2.5000 mg | ORAL_TABLET | Freq: Every day | ORAL | 1 refills | Status: DC

## 2019-10-24 MED ORDER — ACETAMINOPHEN 500 MG PO TABS: 1000.0000 mg | ORAL_TABLET | Freq: Four times a day (QID) | ORAL | 0 refills | Status: DC

## 2019-10-24 MED ORDER — ONDANSETRON HCL 4 MG PO TABS: 4.0000 mg | ORAL_TABLET | Freq: Three times a day (TID) | ORAL | 0 refills | Status: AC | PRN

## 2019-10-24 MED ORDER — CLONAZEPAM 0.5 MG PO TABS: ORAL_TABLET | ORAL | 0 refills | Status: DC

## 2019-10-24 NOTE — Progress Notes (Signed)
Mobility Specialist: Progress Note    10/24/19 1211  Mobility  Activity Ambulated in hall  Level of Assistance Independent  Assistive Device None  Distance Ambulated (ft) 600 ft  Mobility Response Tolerated well  Mobility performed by Mobility specialist  Bed Position Chair  $Mobility charge 1 Mobility   Pre-Mobility: 78 HR, 121/69 BP, 93% SpO2 Post-Mobility: 94 HR, 133/67 BP, 87% SpO2  Pt tolerated ambulation well and had no c/o of SOB, dizziness, or feeling light headed.   [facility] Oscar [name] Mobility Specialist

## 2019-10-24 NOTE — Progress Notes (Signed)
Pt discharged today to home with wife.  Pt's IV removed.  Pt taken off telemetry and CCMD notified.  Pt left with all of their personal belongings.  AVS documentation reviewed with Pt and all questions answered.     

## 2019-10-24 NOTE — Progress Notes (Addendum)
      [address]       ,[place] [address]             [phone]      4 Days Post-Op Procedure(s) (LRB): MINIMALLY INVASIVE MITRAL VALVE REPAIR (MVR) (Right) TRANSESOPHAGEAL ECHOCARDIOGRAM (TEE) (N/A) Subjective: Feels okay this morning. Ready to go home.   Objective: Vital signs in last 24 hours: Temp:  [98.4 F (36.9 C)-99.3 F (37.4 C)] 98.7 F (37.1 C) (06/19 0812) Pulse Rate:  [73-81] 77 (06/19 0408) Cardiac Rhythm: Normal sinus rhythm (06/19 0731) Resp:  [14-23] 23 (06/19 0408) BP: (108-140)/(65-87) 116/70 (06/19 0408) SpO2:  [91 %-99 %] 99 % (06/19 0408) Weight:  [114.1 kg] 114.1 kg (06/19 0501)     Intake/Output from previous day: 06/18 0701 - 06/19 0700 In: 600 [P.O.:600] Out: 360 [Urine:180; Chest Tube:180] Intake/Output this shift: No intake/output data recorded.  General appearance: alert, cooperative and no distress Heart: regular rate and rhythm, S1, S2 normal, no murmur, click, rub or gallop Lungs: clear to auscultation bilaterally and diminished in the lower lobes Abdomen: soft, non-tender; bowel sounds normal; no masses,  no organomegaly Extremities: extremities normal, atraumatic, no cyanosis or edema Wound: clean and dry  Lab Results: Recent Labs    10/23/19 0317 10/24/19 0331  WBC 14.0* 10.4  HGB 12.3* 12.0*  HCT 37.1* 35.6*  PLT 113* 149*   BMET:  Recent Labs    10/23/19 0317 10/24/19 0331  NA 136 137  K 4.0 3.8  CL 96* 97*  CO2 31 32  GLUCOSE 111* 104*  BUN 16 15  CREATININE 1.02 0.95  CALCIUM 8.4* 8.5*    PT/INR:  Recent Labs    10/24/19 0331  LABPROT 13.4  INR 1.1   ABG    Component Value Date/Time   PHART 7.278 (L) 10/20/2019 1717   HCO3 21.8 10/20/2019 1717   TCO2 23 10/20/2019 1717   ACIDBASEDEF 5.0 (H) 10/20/2019 1717   O2SAT 71.5 10/21/2019 0336   CBG (last 3)  Recent Labs    10/21/19 1958 10/22/19 0028 10/22/19 0341  GLUCAP 124* 138* 121*    Assessment/Plan: S/P Procedure(s)  (LRB): MINIMALLY INVASIVE MITRAL VALVE REPAIR (MVR) (Right) TRANSESOPHAGEAL ECHOCARDIOGRAM (TEE) (N/A)  1. CV-BP well controlled. NSR in the 70s. ASA, metorpolol, statin, and coumadin. INR 1.1 2. Pulm-tolerating room air with excellent saturation. CXR appears stable without large pleural effusion or pneumothorax 3. Renal-creatinine 0.95, electrolytes okay. Continue lasix 40mg  BID. No weight for today but yesterday 4kg over baseline 4. H and H 12.0/35.6, expected acute blood loss anemia 5. Endo-blood glucose well controlled  Plan: No weight for today recorded but patient about down to his baseline today. Will decrease lasix to 40mg  daily on discharge for a few days. He has a coumadin appointment scheduled. Will have Dr. Cyndia Bent review the CXR but it does not seem that it will hold him from discharging today. We will have him f/u in a few weeks in the office and he will get a f/u CXR then.   CXR result:  1. Persistent opacification over the lateral right mid to lower lung likely atelectasis, although infection is possible. Possible small amount right pleural fluid.  2. Subtle line over the right apex likely artifactual, although small apical pneumothorax is possible. Recommend attention on follow-up.    LOS: 4 days    Oscar [name] 10/24/2019   Chart reviewed, patient examined, agree with above. CXR looks fine. He can go home.

## 2019-10-28 ENCOUNTER — Encounter: Payer: Self-pay | Admitting: Thoracic Surgery (Cardiothoracic Vascular Surgery)

## 2019-10-30 ENCOUNTER — Telehealth (HOSPITAL_COMMUNITY): Payer: Self-pay

## 2019-10-30 ENCOUNTER — Ambulatory Visit (INDEPENDENT_AMBULATORY_CARE_PROVIDER_SITE_OTHER): Payer: Medicare Other | Admitting: Pharmacist Clinician (PhC)/ Clinical Pharmacy Specialist

## 2019-10-30 ENCOUNTER — Ambulatory Visit (INDEPENDENT_AMBULATORY_CARE_PROVIDER_SITE_OTHER): Payer: Self-pay | Admitting: Physician Assistant

## 2019-10-30 ENCOUNTER — Other Ambulatory Visit: Payer: Self-pay

## 2019-10-30 VITALS — BP 124/81 | HR 80 | Temp 97.3°F | Resp 20 | Ht 74.0 in | Wt 244.0 lb

## 2019-10-30 DIAGNOSIS — Z9889 Other specified postprocedural states: Secondary | ICD-10-CM | POA: Diagnosis not present

## 2019-10-30 DIAGNOSIS — Z7901 Long term (current) use of anticoagulants: Secondary | ICD-10-CM | POA: Diagnosis not present

## 2019-10-30 DIAGNOSIS — T888XXA Other specified complications of surgical and medical care, not elsewhere classified, initial encounter: Secondary | ICD-10-CM

## 2019-10-30 NOTE — Progress Notes (Signed)
Patient contacted office after appointment with Cardiologist office.  He states that he had a coughing spell and he developed fluid leaking out from his incision site.     BP 124/81 (BP Location: Left Arm, Patient Position: Sitting, Cuff Size: Normal)   Pulse 80   Temp (!) 97.3 F (36.3 C) (Temporal)   Resp 20   Ht 6\' 2"  (1.88 m)   Wt 244 lb (110.7 kg)   SpO2 97% Comment: RA  BMI 31.33 kg/m   Gen: NAD Heart: RRR Lungs: CTA bilaterally Incision: thoracotomy site healing without evidence of infection, + ecchymosis present  CT sites with clear serous drainage present, worsened when patient coughs, no evidence of seroma, infection  A/P:  1. Patient has fluid leaking from chest tube site with cough, this can happen with vagal removal... patients wound is not infected, no active bleeding present, pressure dressing applied, patient instructed this may occur for several days as he likely has a pocket of fluid that is leaking out 2. RTC as scheduled for post operative visit, will remove sutures at that time  Ellwood Handler, PA-C

## 2019-10-30 NOTE — Telephone Encounter (Signed)
Faxed referral for Phase II cardiac rehab to [facility].

## 2019-11-02 ENCOUNTER — Ambulatory Visit (INDEPENDENT_AMBULATORY_CARE_PROVIDER_SITE_OTHER): Payer: Self-pay

## 2019-11-02 ENCOUNTER — Other Ambulatory Visit: Payer: Self-pay

## 2019-11-02 ENCOUNTER — Ambulatory Visit (INDEPENDENT_AMBULATORY_CARE_PROVIDER_SITE_OTHER): Payer: Medicare Other | Admitting: Pharmacist

## 2019-11-02 DIAGNOSIS — Z7901 Long term (current) use of anticoagulants: Secondary | ICD-10-CM

## 2019-11-02 DIAGNOSIS — Z9889 Other specified postprocedural states: Secondary | ICD-10-CM

## 2019-11-02 DIAGNOSIS — Z954 Presence of other heart-valve replacement: Secondary | ICD-10-CM

## 2019-11-02 DIAGNOSIS — Z4802 Encounter for removal of sutures: Secondary | ICD-10-CM

## 2019-11-02 LAB — POCT INR: INR: 1.2 — AB (ref 2.0–3.0)

## 2019-11-02 MED ORDER — WARFARIN SODIUM 5 MG PO TABS: 5.0000 mg | ORAL_TABLET | Freq: Every day | ORAL | 0 refills | Status: DC

## 2019-11-02 MED ORDER — CEPHALEXIN 500 MG PO CAPS
500.0000 mg | ORAL_CAPSULE | Freq: Three times a day (TID) | ORAL | 0 refills | Status: DC
Start: 2019-11-02 — End: 2019-12-18

## 2019-11-02 NOTE — Progress Notes (Signed)
Patient arrived for nurse visit to remove sutures post- procedure Mini MV Repair with Dr. Roxy Manns 10/20/2019.  2 chest tube incision sutures removed.  Upon observation, sites looked reddened accompanied with purulent drainage at the site of the sutures as well as hardened area behind sutures, possibly fluid filled.  Dr. Roxy Manns looked at site and determined patient should have Keflex 500 mg TID x7 days to rid any chance of infection patient might have.    Prescription will be sent in elecrtonically to preferred pharmacy, [facility] on [address]. Patient tolerated procedure well. Bandaids placed at site and advised to not leave them on.  Patient/ family instructed to keep the incision sites clean and dry.  Patient/ family acknowledged instructions given. Patient is aware of his follow up appointment with Dr. Roxy Manns with a chest xray.  Patient also asked if he could drive and mow the lawn, to which Dr. Roxy Manns advised he could, as long as he increased his activity slowly.

## 2019-11-04 ENCOUNTER — Other Ambulatory Visit (INDEPENDENT_AMBULATORY_CARE_PROVIDER_SITE_OTHER): Payer: Medicare Other

## 2019-11-04 DIAGNOSIS — Z9889 Other specified postprocedural states: Secondary | ICD-10-CM | POA: Diagnosis not present

## 2019-11-04 DIAGNOSIS — Z7901 Long term (current) use of anticoagulants: Secondary | ICD-10-CM

## 2019-11-06 ENCOUNTER — Ambulatory Visit (INDEPENDENT_AMBULATORY_CARE_PROVIDER_SITE_OTHER): Payer: Medicare Other | Admitting: Pharmacist Clinician (PhC)/ Clinical Pharmacy Specialist

## 2019-11-06 ENCOUNTER — Other Ambulatory Visit: Payer: Self-pay

## 2019-11-06 DIAGNOSIS — Z7901 Long term (current) use of anticoagulants: Secondary | ICD-10-CM

## 2019-11-06 DIAGNOSIS — Z9889 Other specified postprocedural states: Secondary | ICD-10-CM

## 2019-11-06 LAB — POCT INR: INR: 1.5 — AB (ref 2.0–3.0)

## 2019-11-10 ENCOUNTER — Other Ambulatory Visit: Payer: Self-pay | Admitting: Thoracic Surgery (Cardiothoracic Vascular Surgery)

## 2019-11-10 DIAGNOSIS — Z9889 Other specified postprocedural states: Secondary | ICD-10-CM

## 2019-11-15 ENCOUNTER — Other Ambulatory Visit: Payer: Self-pay | Admitting: Physician Assistant

## 2019-11-16 ENCOUNTER — Ambulatory Visit (INDEPENDENT_AMBULATORY_CARE_PROVIDER_SITE_OTHER): Payer: Self-pay | Admitting: Thoracic Surgery (Cardiothoracic Vascular Surgery)

## 2019-11-16 ENCOUNTER — Encounter: Payer: Self-pay | Admitting: Thoracic Surgery (Cardiothoracic Vascular Surgery)

## 2019-11-16 ENCOUNTER — Ambulatory Visit
Admission: RE | Admit: 2019-11-16 | Discharge: 2019-11-16 | Disposition: A | Discharge status: Home / Self Care | Payer: Medicare Other | Source: Ambulatory Visit | Attending: Thoracic Surgery (Cardiothoracic Vascular Surgery) | Admitting: Thoracic Surgery (Cardiothoracic Vascular Surgery)

## 2019-11-16 ENCOUNTER — Other Ambulatory Visit: Payer: Self-pay

## 2019-11-16 ENCOUNTER — Ambulatory Visit (INDEPENDENT_AMBULATORY_CARE_PROVIDER_SITE_OTHER): Payer: Medicare Other

## 2019-11-16 VITALS — BP 119/80 | HR 110 | Temp 97.8°F | Resp 20 | Ht 74.0 in | Wt 242.0 lb

## 2019-11-16 DIAGNOSIS — Z9889 Other specified postprocedural states: Secondary | ICD-10-CM

## 2019-11-16 DIAGNOSIS — Z5181 Encounter for therapeutic drug level monitoring: Secondary | ICD-10-CM

## 2019-11-16 DIAGNOSIS — Z7901 Long term (current) use of anticoagulants: Secondary | ICD-10-CM

## 2019-11-16 LAB — POCT INR: INR: 2.3 (ref 2.0–3.0)

## 2019-11-16 MED ORDER — WARFARIN SODIUM 5 MG PO TABS: 5.0000 mg | ORAL_TABLET | Freq: Every day | ORAL | 0 refills | Status: DC

## 2019-11-16 NOTE — Patient Instructions (Signed)
-   Continue warfarin dosage of 7.5 mg every day - Recheck INR on 7/28 at your appt for your ECHO

## 2019-11-16 NOTE — Patient Instructions (Signed)
Stop taking aspirin while you are on Coumadin (warfarin).  We tentatively will plan to stop Coumadin approximately 3 months after surgery as long as you don't have significant irregular heart rhythms  Continue all other previous medications without any changes at this time  You may continue to gradually increase your physical activity as tolerated.  Refrain from any heavy lifting or strenuous use of your arms and shoulders until at least 8 weeks from the time of your surgery, and avoid activities that cause increased pain in your chest on the side of your surgical incision.  Otherwise you may continue to increase activities without any particular limitations.  Increase the intensity and duration of physical activity gradually.  Endocarditis is a potentially serious infection of heart valves or inside lining of the heart.  It occurs more commonly in patients with diseased heart valves (such as patient's with aortic or mitral valve disease) and in patients who have undergone heart valve repair or replacement.  Certain surgical and dental procedures may put you at risk, such as dental cleaning, other dental procedures, or any surgery involving the respiratory, urinary, gastrointestinal tract, gallbladder or prostate gland.   To minimize your chances for develooping endocarditis, maintain good oral health and seek prompt medical attention for any infections involving the mouth, teeth, gums, skin or urinary tract.    Always notify your doctor or dentist about your underlying heart valve condition before having any invasive procedures. You will need to take antibiotics before certain procedures, including all routine dental cleanings or other dental procedures.  Your cardiologist or dentist should prescribe these antibiotics for you to be taken ahead of time.

## 2019-11-16 NOTE — Progress Notes (Signed)
[address]       [place] [address]             [phone]     CARDIOTHORACIC SURGERY OFFICE NOTE  Primary Cardiologist is Evalina Field, MD PCP is Orma Flaming, MD   HPI:  Patient is a 64 year old male who returns the office today for routine follow-up status post minimally invasive mitral valve repair on October 20, 2019 for mitral valve prolapse with severe symptomatic primary mitral regurgitation.  His early postoperative recovery in the hospital was uneventful and he was discharged home on the fourth postoperative day.  He was seen in the office several days later after some serous drainage from one of his chest tube incisions, and he was given a short course of oral Keflex because of mild surrounding cellulitis.  He has been followed carefully in the Coumadin clinic, and on one of his visits to the Coumadin clinic metoprolol was stopped reportedly because the patient's blood pressure was borderline low.  Patient returns to our office today and reports that he is doing very well.  He has minimal residual soreness in the chest that he notices only when he is doing something more strenuous with his arms or shoulders.  He denies any shortness of breath either with activity or at rest.  Appetite is good.  He is sleeping well at night.  Overall he is pleased with his progress.  Current Outpatient Medications  Medication Sig Dispense Refill  . acetaminophen (TYLENOL) 500 MG tablet Take 2 tablets (1,000 mg total) by mouth every 6 (six) hours. 30 tablet 0  . aspirin EC 81 MG EC tablet Take 1 tablet (81 mg total) by mouth daily. Swallow whole. 30 tablet 11  . cephALEXin (KEFLEX) 500 MG capsule Take 1 capsule (500 mg total) by mouth 3 (three) times daily. 21 capsule 0  . clonazePAM (KLONOPIN) 0.5 MG tablet TAKE 1 TABLET BY MOUTH  DAILY AT BEDTIME 20 tablet 0  . fluticasone (FLONASE) 50 MCG/ACT nasal spray Place 2 sprays into the nose 2 (two) times daily. 16 g 0  .  metoprolol tartrate (LOPRESSOR) 25 MG tablet Take 0.5 tablets (12.5 mg total) by mouth 2 (two) times daily. 30 tablet 1  . Multiple Vitamin (MULTIVITAMIN) tablet Take 1 tablet by mouth daily.    . ondansetron (ZOFRAN) 4 MG tablet Take 1 tablet (4 mg total) by mouth every 8 (eight) hours as needed for nausea or vomiting. 20 tablet 0  . oxyCODONE (OXY IR/ROXICODONE) 5 MG immediate release tablet Take 1 tablet (5 mg total) by mouth every 6 (six) hours as needed for severe pain. 30 tablet 0  . traZODone (DESYREL) 50 MG tablet Take one pill at night for sleep 20 tablet 1  . warfarin (COUMADIN) 5 MG tablet Take 1 tablet (5 mg total) by mouth daily. 30 tablet 0  . furosemide (LASIX) 40 MG tablet Take 1 tablet (40 mg total) by mouth daily for 3 days. 3 tablet 0  . potassium chloride SA (KLOR-CON) 20 MEQ tablet Take 1 tablet (20 mEq total) by mouth daily for 3 days. 3 tablet 0  . rosuvastatin (CRESTOR) 20 MG tablet Take 1 tablet (20 mg total) by mouth daily. 90 tablet 3   No current facility-administered medications for this visit.      Physical Exam:   BP 119/80   Pulse (!) 110   Temp 97.8 F (36.6 C) (Skin)   Resp 20  Ht 6\' 2"  (1.88 m)   Wt 242 lb (109.8 kg)   SpO2 95% Comment: RA  BMI 31.07 kg/m   General:  Well-appearing  Chest:   Clear to auscultation  CV:   Regular rate and rhythm with no murmur appreciated  Incisions:  Healing nicely, chest tube incisions have sealed completely  Abdomen:  Soft nontender  Extremities:  Warm and well-perfused  Diagnostic Tests:  2 channel telemetry rhythm strip demonstrates sinus tachycardia   CHEST - 2 VIEW  COMPARISON:  October 24, 2019.  FINDINGS: The heart size and mediastinal contours are within normal limits. No pneumothorax is noted. Small right pleural effusion may be present. Left lung is clear. Probable scarring is noted in right upper lobe. No definite acute abnormality is noted. The visualized skeletal structures are  unremarkable.  IMPRESSION: Small right pleural effusion may be present. No other significant abnormality is noted.   Electronically Signed   By: Marijo Conception M.D.   On: 11/16/2019 15:45   Impression:  Patient is doing well approximately 1 month status post minimally invasive mitral valve repair.  His resting pulse is somewhat elevated but telemetry rhythm strip demonstrates sinus tachycardia.  Metoprolol has been stopped.  The patient is therapeutic on warfarin.   Plan:  I have recommended that the patient stop taking aspirin now that he is therapeutic on warfarin.  Aspirin can be resumed once warfarin is stopped in approximately 2 months.  We have otherwise not recommended any changes to his current medications.  I have encouraged the patient to begin gradually increasing his physical activity as tolerated.  We discussed a specific plan for his physical rehabilitation, potentially to include the cardiac rehab program.  The patient has been reminded regarding the importance of dental hygiene and the lifelong need for antibiotic prophylaxis for all dental cleanings and other related invasive procedures.  All of his questions have been addressed.  Patient will return to our office in approximately 2 months at which time we will review the results of his routine follow-up echocardiogram.  He will call return sooner should specific problems or questions arise.    Valentina Gu. Roxy Manns, MD 11/16/2019 4:35 PM

## 2019-11-17 ENCOUNTER — Other Ambulatory Visit: Payer: Self-pay

## 2019-11-18 ENCOUNTER — Telehealth: Payer: Self-pay

## 2019-11-18 ENCOUNTER — Other Ambulatory Visit: Payer: Self-pay

## 2019-11-18 MED ORDER — WARFARIN SODIUM 5 MG PO TABS: 5.0000 mg | ORAL_TABLET | Freq: Every day | ORAL | 0 refills | Status: DC

## 2019-11-18 MED ORDER — WARFARIN SODIUM 5 MG PO TABS: ORAL_TABLET | ORAL | 0 refills | Status: DC

## 2019-11-18 NOTE — Telephone Encounter (Signed)
Please call to discuss Warfarin doseage. Patient states his pharmacy would not fill due to doseage should be 7.5mg . please call to discuss.

## 2019-11-18 NOTE — Telephone Encounter (Signed)
Spoke w/ pt's wife. She reports that they went to p/u pt's warfarin and the pharmacy would not fill it "b/c it's too soon" and that the dosage of 5 mg should be 7.5 mg. Advised her that pt should be on the 5 mg dosage w/ instructions to take 1.5 tablets daily until recheck. Advised her that b/c this dosage may fluctuate, it is easier to use the 5 mg tabs until his dosage is more regulated. She verbalizes understanding. Advised her that I will send in refill w/ instructions to "take as directed" so that the pharmacy will not limit his pills. She is appreciative and will call back w/ any further questions or concerns.

## 2019-11-18 NOTE — Telephone Encounter (Signed)
Left message for pt to call back  °

## 2019-11-24 ENCOUNTER — Other Ambulatory Visit: Payer: Self-pay | Admitting: Cardiovascular Disease

## 2019-12-02 ENCOUNTER — Other Ambulatory Visit: Payer: Self-pay

## 2019-12-02 ENCOUNTER — Ambulatory Visit (HOSPITAL_COMMUNITY): Payer: Medicare Other | Attending: Cardiology

## 2019-12-02 ENCOUNTER — Ambulatory Visit (INDEPENDENT_AMBULATORY_CARE_PROVIDER_SITE_OTHER): Payer: Medicare Other | Admitting: *Deleted

## 2019-12-02 DIAGNOSIS — Z7901 Long term (current) use of anticoagulants: Secondary | ICD-10-CM

## 2019-12-02 DIAGNOSIS — Z9889 Other specified postprocedural states: Secondary | ICD-10-CM | POA: Diagnosis not present

## 2019-12-02 DIAGNOSIS — I340 Nonrheumatic mitral (valve) insufficiency: Secondary | ICD-10-CM | POA: Insufficient documentation

## 2019-12-02 LAB — POCT INR: INR: 3 (ref 2.0–3.0)

## 2019-12-02 NOTE — Patient Instructions (Signed)
Description   Continue taking warfarin dosage of 7.5 mg every day. Recheck INR in 2 weeks with MD appt.

## 2019-12-03 LAB — ECHOCARDIOGRAM COMPLETE
Area-P 1/2: 7.82 cm2
S' Lateral: 3.6 cm

## 2019-12-12 ENCOUNTER — Other Ambulatory Visit: Payer: Self-pay | Admitting: Cardiovascular Disease

## 2019-12-15 NOTE — Progress Notes (Signed)
Cardiology Office Note:   Date:  12/18/2019  NAME:  [name]    MRN: [id number] DOB:  1955-09-01   PCP:  Orma Flaming, MD  Cardiologist:  Evalina Field, MD   Referring MD: Orma Flaming, MD   Chief Complaint  Patient presents with  . Follow-up   History of Present Illness:   [name] is a 64 y.o. male with a hx of severe MR s/p MV repair who presents for follow-up.  He reports he is doing well since surgery.  He still on Coumadin.  He does have plans to continue this for a total of 3 months.  He did have a rough go getting back to activity but seems to be much better.  He reports he has been at the gym and doing quite a bit of strenuous activity.  He is walking up to 2 miles without any limitations such as chest pain or shortness of breath.  He is also doing high-impact activities such as lifting weights.  He is having no major issues from what I can tell.  He is still on Coumadin and will transition to aspirin after 3 months.  He reports he is going on a trip out of the country and will need to stop his Coumadin roughly 5 days before completing 3 months.  I informed him that this is okay.  He knows to take aspirin.  We did go over his echocardiogram which shows ejection fraction 50-55%.  Overall this is expected after mitral valve surgery.  He understands he needs to have antibiotic prophylaxis for any dental procedures and genitourinary procedures.  He will touch base with me if he has any procedures.   Problem List 1. P2 prolapse/severe MR -s/p suture plication of PMVL, 32 mm sorin annuloplasty 10/20/2019 Darylene Price @ [facility]) -dental ppx needed -EF 50% 2. Non-obstructive CAD -20% diagonal  -needs statin  Past Medical History: Past Medical History:  Diagnosis Date  . Allergy   . Cataract   . CHF (congestive heart failure) ([place]) 09/2019  . Chronic kidney disease    IR:CVELFY  . Complication of anesthesia   . Hyperlipidemia   . Insomnia   . Mitral  regurgitation   . PONV (postoperative nausea and vomiting)   . S/P minimally-invasive mitral valve repair 10/20/2019   Complex valvuloplasty including artificial Gore-tex neochord placement B01, suture plication of posterior leaflet and 32 mm Sorin Memo 4D ring annuloplasty via right mini thoracotomy approach    Past Surgical History: Past Surgical History:  Procedure Laterality Date  . BUBBLE STUDY  09/16/2019   Procedure: BUBBLE STUDY;  Surgeon: Geralynn Rile, MD;  Location: [place];  Service: Cardiovascular;;  . CARDIAC CATHETERIZATION Bilateral 09/21/2019  . FOOT SURGERY     right  . MITRAL VALVE REPAIR Right 10/20/2019   Procedure: MINIMALLY INVASIVE MITRAL VALVE REPAIR (MVR);  Surgeon: Rexene Alberts, MD;  Location: [place];  Service: Open Heart Surgery;  Laterality: Right;  . RIGHT/LEFT HEART CATH AND CORONARY ANGIOGRAPHY N/A 09/21/2019   Procedure: RIGHT/LEFT HEART CATH AND CORONARY ANGIOGRAPHY;  Surgeon: Nelva Bush, MD;  Location: [place] [facility];  Service: Cardiovascular;  Laterality: N/A;  . SHOULDER ARTHROSCOPY    . TEE WITHOUT CARDIOVERSION N/A 09/16/2019   Procedure: TRANSESOPHAGEAL ECHOCARDIOGRAM (TEE);  Surgeon: Geralynn Rile, MD;  Location: [place];  Service: Cardiovascular;  Laterality: N/A;  . TEE WITHOUT CARDIOVERSION N/A 10/20/2019   Procedure: TRANSESOPHAGEAL ECHOCARDIOGRAM (TEE);  Surgeon: Rexene Alberts,  MD;  Location: [facility];  Service: Open Heart Surgery;  Laterality: N/A;  . thumb surgery     right  . TONSILLECTOMY    . VASECTOMY      Current Medications: Current Meds  Medication Sig  . acetaminophen (TYLENOL) 500 MG tablet Take 2 tablets (1,000 mg total) by mouth every 6 (six) hours.  . clonazePAM (KLONOPIN) 0.5 MG tablet TAKE 1 TABLET BY MOUTH  DAILY AT BEDTIME  . Multiple Vitamin (MULTIVITAMIN) tablet Take 1 tablet by mouth daily.  . ondansetron (ZOFRAN) 4 MG tablet Take 1 tablet (4 mg total) by mouth every 8 (eight) hours  as needed for nausea or vomiting.  . traZODone (DESYREL) 50 MG tablet Take one pill at night for sleep  . warfarin (COUMADIN) 5 MG tablet TAKE AS DIRECTED BY THE ANTI-COAG CLINIC (Patient taking differently: 7.5 mg. TAKE AS DIRECTED BY THE ANTI-COAG CLINIC)  . [DISCONTINUED] aspirin EC 81 MG EC tablet Take 1 tablet (81 mg total) by mouth daily. Swallow whole.  . [DISCONTINUED] cephALEXin (KEFLEX) 500 MG capsule Take 1 capsule (500 mg total) by mouth 3 (three) times daily.  . [DISCONTINUED] fluticasone (FLONASE) 50 MCG/ACT nasal spray Place 2 sprays into the nose 2 (two) times daily.  . [DISCONTINUED] metoprolol tartrate (LOPRESSOR) 25 MG tablet Take 0.5 tablets (12.5 mg total) by mouth 2 (two) times daily.  . [DISCONTINUED] oxyCODONE (OXY IR/ROXICODONE) 5 MG immediate release tablet Take 1 tablet (5 mg total) by mouth every 6 (six) hours as needed for severe pain.     Allergies:    Poison ivy treatments [poison ivy treatments] and Pollen extract   Social History: Social History   Socioeconomic History  . Marital status: Married    Spouse name: Not on file  . Number of children: 4  . Years of education: Not on file  . Highest education level: Not on file  Occupational History  . Occupation: retired  Tobacco Use  . Smoking status: Never Smoker  . Smokeless tobacco: Never Used  Vaping Use  . Vaping Use: Never used  Substance and Sexual Activity  . Alcohol use: Yes    Alcohol/week: 3.0 standard drinks    Types: 3 Cans of beer per week  . Drug use: No  . Sexual activity: Yes    Birth control/protection: Other-see comments    Comment: vasectomy  Other Topics Concern  . Not on file  Social History Narrative   ** Merged History Encounter **       Social Determinants of Health   Financial Resource Strain:   . Difficulty of Paying Living Expenses:   Food Insecurity:   . Worried About Charity fundraiser in the Last Year:   . Arboriculturist in the Last Year:   Transportation  Needs:   . Film/video editor (Medical):   Marland Kitchen Lack of Transportation (Non-Medical):   Physical Activity:   . Days of Exercise per Week:   . Minutes of Exercise per Session:   Stress:   . Feeling of Stress :   Social Connections:   . Frequency of Communication with Friends and Family:   . Frequency of Social Gatherings with Friends and Family:   . Attends Religious Services:   . Active Member of Clubs or Organizations:   . Attends Archivist Meetings:   Marland Kitchen Marital Status:      Family History: The patient's family history includes Arthritis in his maternal grandfather and maternal grandmother; Cancer in  his brother, father, and mother; Heart attack in his maternal grandfather; Heart disease in his father and maternal grandfather. There is no history of Colon cancer, Esophageal cancer, or Stomach cancer.  ROS:   All other ROS reviewed and negative. Pertinent positives noted in the HPI.     EKGs/Labs/Other Studies Reviewed:   The following studies were personally reviewed by me today:  TTE 1. Diffuse hypokinesis with abnormal septal motion do not think 3D  quantitative EF is accurate Preop EF estimated at 50-55% appears worse  post MV repair . Left ventricular ejection fraction, by estimation, is 45  to 50%. The left ventricle has mildly  decreased function. The left ventricle has no regional wall motion  abnormalities. Left ventricular diastolic parameters are indeterminate.  2. Right ventricular systolic function is normal. The right ventricular  size is normal. There is normal pulmonary artery systolic pressure.  3. Left atrial size was mildly dilated.  4. Post complex MV repair 10/20/19 using 32 mm Sorin Memo 4D ring and  multiple neo chords prominent to PM papillary muscle no significant  residual MR Mean gradient in diastole 4 mmHg peak 10 mmHg no MS by PT 1/2  . The mitral valve has been  repaired/replaced. No evidence of mitral valve regurgitation. No  evidence  of mitral stenosis.  5. The aortic valve is normal in structure. Aortic valve regurgitation is  not visualized. No aortic stenosis is present.  6. The inferior vena cava is normal in size with greater than 50%  respiratory variability, suggesting right atrial pressure of 3 mmHg.  Recent Labs: 04/08/2019: TSH 1.84 10/16/2019: ALT 36 10/21/2019: Magnesium 2.1 10/24/2019: BUN 15; Creatinine, Ser 0.95; Hemoglobin 12.0; Platelets 149; Potassium 3.8; Sodium 137   Recent Lipid Panel    Component Value Date/Time   CHOL 240 (H) 04/08/2019 1055   TRIG 146.0 04/08/2019 1055   HDL 58.50 04/08/2019 1055   CHOLHDL 4 04/08/2019 1055   VLDL 29.2 04/08/2019 1055   LDLCALC 152 (H) 04/08/2019 1055    Physical Exam:   VS:  BP 132/76 (BP Location: Left Arm, Patient Position: Sitting, Cuff Size: Normal)   Pulse 100   Ht 6\' 2"  (1.88 m)   Wt 225 lb 6.4 oz (102.2 kg)   SpO2 97%   BMI 28.94 kg/m    Wt Readings from Last 3 Encounters:  12/18/19 225 lb 6.4 oz (102.2 kg)  11/16/19 242 lb (109.8 kg)  10/30/19 244 lb (110.7 kg)    General: Well nourished, well developed, in no acute distress Heart: Atraumatic, normal size  Eyes: PEERLA, EOMI  Neck: Supple, no JVD Endocrine: No thryomegaly Cardiac: Normal S1, S2; RRR; no murmurs, rubs, or gallops Lungs: Clear to auscultation bilaterally, no wheezing, rhonchi or rales  Abd: Soft, nontender, no hepatomegaly  Ext: No edema, pulses 2+ Musculoskeletal: No deformities, BUE and BLE strength normal and equal Skin: Warm and dry, no rashes   Neuro: Alert and oriented to person, place, time, and situation, CNII-XII grossly intact, no focal deficits  Psych: Normal mood and affect   ASSESSMENT:   Oscar [name] is a 64 y.o. male who presents for the following: 1. S/P minimally-invasive mitral valve repair   2. Mitral valve insufficiency, unspecified etiology   3. Coronary artery disease involving native coronary artery of native heart without  angina pectoris     PLAN:   1. S/P minimally-invasive mitral valve repair 2. Mitral valve insufficiency, unspecified etiology -Severe MR with P2 prolapse.  Status post  mitral valve repair with a 32 mm Sorin annuloplasty ring on 2019-10-20 -He is doing quite well.  Most recent echocardiogram with normal gradient across the mitral valve.  Ejection fraction 50 to 55%.  This is expected. -No major symptoms. -He will complete 3 months of Coumadin and then transition to aspirin.  He will go out of the country on August 31.  I informed him that it is okay to stop his Coumadin 4 days before this.  This will be roughly 2-1/2 months of Coumadin therapy. -We did give him a prescription for amoxicillin for any dental work prophylaxis for subacute bacterial endocarditis. -Blood pressure well controlled.  3. Coronary artery disease involving native coronary artery of native heart without angina pectoris -Nonobstructive CAD on cath with a 20% lesion in the LAD.  He will start aspirin after Coumadin.  We will discuss statin at her next visit.  His most recent LDL cholesterol is 152.   Disposition: Return in about 6 months (around 06/19/2020).  Medication Adjustments/Labs and Tests Ordered: Current medicines are reviewed at length with the patient today.  Concerns regarding medicines are outlined above.  No orders of the defined types were placed in this encounter.  Meds ordered this encounter  Medications  . aspirin EC 81 MG tablet    Sig: Take 1 tablet (81 mg total) by mouth daily. Swallow whole.    Dispense:  90 tablet    Refill:  3  . amoxicillin (AMOXIL) 500 MG tablet    Sig: Take 4 tablets (2,000 mg total) by mouth once as needed for up to 1 dose.    Dispense:  4 tablet    Refill:  0    Patient Instructions  Medication Instructions:  Take Amoxicillin for dental procedures Take Aspirin 81 mg daily after you stop Coumadin   *If you need a refill on your cardiac medications before your next  appointment, please call your pharmacy*   Follow-Up: At [facility], you and your health needs are our priority.  As part of our continuing mission to provide you with exceptional heart care, we have created designated Provider Care Teams.  These Care Teams include your primary Cardiologist (physician) and Advanced Practice Providers (APPs -  Physician Assistants and Nurse Practitioners) who all work together to provide you with the care you need, when you need it.  We recommend signing up for the patient portal called "MyChart".  Sign up information is provided on this After Visit Summary.  MyChart is used to connect with patients for Virtual Visits (Telemedicine).  Patients are able to view lab/test results, encounter notes, upcoming appointments, etc.  Non-urgent messages can be sent to your provider as well.   To learn more about what you can do with MyChart, go to [email or web address].    Your next appointment:   6 month(s)  The format for your next appointment:   In Person  Provider:   Eleonore Chiquito, MD         Time Spent with Patient: I have spent a total of 25 minutes with patient reviewing hospital notes, telemetry, EKGs, labs and examining the patient as well as establishing an assessment and plan that was discussed with the patient.  > 50% of time was spent in direct patient care.  Signed, Addison Naegeli. Audie Box, [place]  [address], [place], [place] [address] [phone]  12/18/2019 12:34 PM

## 2019-12-18 ENCOUNTER — Encounter: Payer: Self-pay | Admitting: Cardiovascular Disease

## 2019-12-18 ENCOUNTER — Ambulatory Visit: Payer: Medicare Other | Admitting: Cardiovascular Disease

## 2019-12-18 ENCOUNTER — Encounter: Payer: Self-pay | Admitting: Family Medicine

## 2019-12-18 ENCOUNTER — Other Ambulatory Visit: Payer: Self-pay

## 2019-12-18 ENCOUNTER — Ambulatory Visit (INDEPENDENT_AMBULATORY_CARE_PROVIDER_SITE_OTHER): Payer: Medicare Other

## 2019-12-18 VITALS — BP 132/76 | HR 100 | Ht 74.0 in | Wt 225.4 lb

## 2019-12-18 DIAGNOSIS — Z9889 Other specified postprocedural states: Secondary | ICD-10-CM

## 2019-12-18 DIAGNOSIS — Z7901 Long term (current) use of anticoagulants: Secondary | ICD-10-CM | POA: Diagnosis not present

## 2019-12-18 DIAGNOSIS — I2510 Atherosclerotic heart disease of native coronary artery without angina pectoris: Secondary | ICD-10-CM | POA: Diagnosis not present

## 2019-12-18 DIAGNOSIS — I340 Nonrheumatic mitral (valve) insufficiency: Secondary | ICD-10-CM | POA: Diagnosis not present

## 2019-12-18 LAB — POCT INR: INR: 2.4 (ref 2.0–3.0)

## 2019-12-18 MED ORDER — AMOXICILLIN 500 MG PO TABS
2000.0000 mg | ORAL_TABLET | Freq: Once | ORAL | 0 refills | Status: DC | PRN
Start: 2019-12-18 — End: 2021-01-27

## 2019-12-18 MED ORDER — ASPIRIN EC 81 MG PO TBEC: 81.0000 mg | DELAYED_RELEASE_TABLET | Freq: Every day | ORAL | 3 refills | Status: AC

## 2019-12-18 NOTE — Patient Instructions (Signed)
Medication Instructions:  Take Amoxicillin for dental procedures Take Aspirin 81 mg daily after you stop Coumadin   *If you need a refill on your cardiac medications before your next appointment, please call your pharmacy*   Follow-Up: At [facility], you and your health needs are our priority.  As part of our continuing mission to provide you with exceptional heart care, we have created designated Provider Care Teams.  These Care Teams include your primary Cardiologist (physician) and Advanced Practice Providers (APPs -  Physician Assistants and Nurse Practitioners) who all work together to provide you with the care you need, when you need it.  We recommend signing up for the patient portal called "MyChart".  Sign up information is provided on this After Visit Summary.  MyChart is used to connect with patients for Virtual Visits (Telemedicine).  Patients are able to view lab/test results, encounter notes, upcoming appointments, etc.  Non-urgent messages can be sent to your provider as well.   To learn more about what you can do with MyChart, go to [email or web address].    Your next appointment:   6 month(s)  The format for your next appointment:   In Person  Provider:   Eleonore Chiquito, MD

## 2019-12-18 NOTE — Patient Instructions (Signed)
Continue taking warfarin dosage of 7.5 mg every day. Per pt and Dr Audie Box Warfarin will be discontinued 12/31/2019.

## 2019-12-28 ENCOUNTER — Other Ambulatory Visit: Payer: Self-pay

## 2019-12-28 ENCOUNTER — Other Ambulatory Visit: Payer: Medicare Other

## 2019-12-28 DIAGNOSIS — Z20822 Contact with and (suspected) exposure to covid-19: Secondary | ICD-10-CM

## 2019-12-30 LAB — SARS-COV-2, NAA 2 DAY TAT

## 2019-12-30 LAB — NOVEL CORONAVIRUS, NAA: SARS-CoV-2, NAA: NOT DETECTED

## 2020-01-02 ENCOUNTER — Other Ambulatory Visit: Payer: Self-pay | Admitting: Family Medicine

## 2020-01-06 ENCOUNTER — Other Ambulatory Visit: Payer: Self-pay | Admitting: Cardiovascular Disease

## 2020-01-18 ENCOUNTER — Other Ambulatory Visit: Payer: Medicare Other

## 2020-01-18 ENCOUNTER — Other Ambulatory Visit: Payer: Self-pay

## 2020-01-18 DIAGNOSIS — Z20822 Contact with and (suspected) exposure to covid-19: Secondary | ICD-10-CM

## 2020-01-19 LAB — SARS-COV-2, NAA 2 DAY TAT

## 2020-01-19 LAB — NOVEL CORONAVIRUS, NAA: SARS-CoV-2, NAA: NOT DETECTED

## 2020-02-08 ENCOUNTER — Other Ambulatory Visit: Payer: Self-pay

## 2020-02-08 ENCOUNTER — Ambulatory Visit (INDEPENDENT_AMBULATORY_CARE_PROVIDER_SITE_OTHER): Payer: Medicare Other | Admitting: Thoracic Surgery (Cardiothoracic Vascular Surgery)

## 2020-02-08 ENCOUNTER — Encounter: Payer: Self-pay | Admitting: Thoracic Surgery (Cardiothoracic Vascular Surgery)

## 2020-02-08 VITALS — BP 143/91 | HR 89 | Temp 97.9°F | Resp 20 | Ht 74.0 in | Wt 249.0 lb

## 2020-02-08 DIAGNOSIS — Z9889 Other specified postprocedural states: Secondary | ICD-10-CM | POA: Diagnosis not present

## 2020-02-08 NOTE — Patient Instructions (Signed)
Stop taking Coumadin (warfarin)  Continue all other previous medications without any changes at this time, including aspirin 81 mg/day  You may resume unrestricted physical activity without any particular limitations at this time.  Endocarditis is a potentially serious infection of heart valves or inside lining of the heart.  It occurs more commonly in patients with diseased heart valves (such as patient's with aortic or mitral valve disease) and in patients who have undergone heart valve repair or replacement.  Certain surgical and dental procedures may put you at risk, such as dental cleaning, other dental procedures, or any surgery involving the respiratory, urinary, gastrointestinal tract, gallbladder or prostate gland.   To minimize your chances for develooping endocarditis, maintain good oral health and seek prompt medical attention for any infections involving the mouth, teeth, gums, skin or urinary tract.    Always notify your doctor or dentist about your underlying heart valve condition before having any invasive procedures. You will need to take antibiotics before certain procedures, including all routine dental cleanings or other dental procedures.  Your cardiologist or dentist should prescribe these antibiotics for you to be taken ahead of time.

## 2020-02-08 NOTE — Progress Notes (Signed)
[address]       [place] [address]             [phone]     CARDIOTHORACIC SURGERY OFFICE NOTE  Primary Cardiologist is Evalina Field, MD PCP is Orma Flaming, MD   HPI:  Patient is a 64 year old male who returns the office today for routine follow-up status post minimally invasive mitral valve repair on October 20, 2019 for mitral valve prolapse with severe symptomatic primary mitral regurgitation.  The patient's early postoperative recovery was uneventful and he was last seen here in our office on November 16, 2019.  He underwent routine follow-up echocardiogram on December 02, 2019 which revealed intact mitral valve repair with no residual mitral regurgitation.  Left ventricular ejection fraction was estimated 45 to 50% down from 50 to 55% immediately prior to surgery.  Since then he was seen in follow-up by Dr. Nori Riis and he returns to our office for routine follow-up today.  Patient reports he is doing exceptionally well.  He is back to completely normal, unrestricted physical activity.  He is very active and states that he has absolutely no physical limitations.  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  He states that his breathing is noticeably better than it was prior to surgery.  He now has no exertional shortness of breath even with fairly strenuous activity.  He is pleased with his progress.  He recently stopped taking Coumadin and is now just taking aspirin 81 mg daily   Current Outpatient Medications  Medication Sig Dispense Refill  . amoxicillin (AMOXIL) 500 MG tablet Take 4 tablets (2,000 mg total) by mouth once as needed for up to 1 dose. 4 tablet 0  . aspirin EC 81 MG tablet Take 1 tablet (81 mg total) by mouth daily. Swallow whole. 90 tablet 3  . clonazePAM (KLONOPIN) 0.5 MG tablet TAKE 1 TABLET BY MOUTH  DAILY AT BEDTIME 20 tablet 0   . clonazePAM (KLONOPIN) 1 MG tablet TAKE 1 TABLET BY MOUTH  DAILY AT BEDTIME 90 tablet 0  . Multiple Vitamin (MULTIVITAMIN) tablet Take 1 tablet by mouth daily.    . ondansetron (ZOFRAN) 4 MG tablet Take 1 tablet (4 mg total) by mouth every 8 (eight) hours as needed for nausea or vomiting. 20 tablet 0  . traZODone (DESYREL) 50 MG tablet Take one pill at night for sleep 20 tablet 1  . warfarin (COUMADIN) 5 MG tablet TAKE AS DIRECTED BY THE ANTI-COAG CLINIC (Patient taking differently: 7.5 mg. TAKE AS DIRECTED BY THE ANTI-COAG CLINIC) 45 tablet 1   No current facility-administered medications for this visit.      Physical Exam:   BP (!) 143/91   Pulse 89   Temp 97.9 F (36.6 C)   Resp 20   Ht 6\' 2"  (1.88 m)   Wt 249 lb (112.9 kg)   SpO2 94% Comment: RA with mask  BMI 31.97 kg/m   General:  Well-appearing  Chest:   Clear to auscultation  CV:   Regular rate and rhythm without murmur  Incisions:  Completely healed  Abdomen:  Soft nontender  Extremities:  Warm and well-perfused  Diagnostic Tests:   ECHOCARDIOGRAM REPORT       Patient Name:  Oscar [name] Date of Exam: 12/02/2019  Medical Rec #: [id number]     Height:  74.0 in  Accession #:  [id number]    Weight:    242.0 lb  Date of Birth: 31-May-1955     BSA:     2.357 m  Patient Age:  40 years     BP:      119/80 mmHg  Patient Gender: M         HR:      98 bpm.  Exam Location: [place]   Procedure: 2D Echo, 3D Echo, Cardiac Doppler and Color Doppler   Indications:  I34.0 Mitral Valve Insufficiency    History:    Patient has prior history of Echocardiogram examinations,  most         recent 10/20/2019. Mitral Valve Disease; Risk  Factors:Family         History of Coronary Artery Disease and Dyslipidemia.  Mitral         Valve Repair (10-20-19, 3mm Sorin Memo 4D Ring).    Sonographer:  Deliah Boston RDCS   Referring Phys: [id number] [place]    1. Diffuse hypokinesis with abnormal septal motion do not think 3D  quantitative EF is accurate Preop EF estimated at 50-55% appears worse  post MV repair . Left ventricular ejection fraction, by estimation, is 45  to 50%. The left ventricle has mildly  decreased function. The left ventricle has no regional wall motion  abnormalities. Left ventricular diastolic parameters are indeterminate.  2. Right ventricular systolic function is normal. The right ventricular  size is normal. There is normal pulmonary artery systolic pressure.  3. Left atrial size was mildly dilated.  4. Post complex MV repair 10/20/19 using 32 mm Sorin Memo 4D ring and  multiple neo chords prominent to PM papillary muscle no significant  residual MR Mean gradient in diastole 4 mmHg peak 10 mmHg no MS by PT 1/2  . The mitral valve has been  repaired/replaced. No evidence of mitral valve regurgitation. No evidence  of mitral stenosis.  5. The aortic valve is normal in structure. Aortic valve regurgitation is  not visualized. No aortic stenosis is present.  6. The inferior vena cava is normal in size with greater than 50%  respiratory variability, suggesting right atrial pressure of 3 mmHg.   FINDINGS  Left Ventricle: Diffuse hypokinesis with abnormal septal motion do not  think 3D quantitative EF is accurate Preop EF estimated at 50-55% appears  worse post MV repair. Left ventricular ejection fraction, by estimation,  is 45 to 50%. The left ventricle  has mildly decreased function. The left ventricle has no regional wall  motion abnormalities. The left ventricular internal cavity size was normal  in size. There is no left ventricular hypertrophy. Left ventricular  diastolic parameters are indeterminate.   Right Ventricle: The right ventricular size is normal. No increase in  right ventricular wall thickness. Right ventricular systolic function is   normal. There is normal pulmonary artery systolic pressure. The tricuspid  regurgitant velocity is 2.41 m/s, and  with an assumed right atrial pressure of 3 mmHg, the estimated right  ventricular systolic pressure is 81.8 mmHg.   Left Atrium: Left atrial size was mildly dilated.   Right Atrium: Right atrial size was normal in size.   Pericardium: There is no evidence of pericardial effusion.   Mitral Valve: Post complex MV repair 10/20/19 using 32 mm Sorin Memo 4D  ring and multiple neo chords prominent to PM papillary muscle no  significant residual MR Mean gradient in diastole 4 mmHg peak  10 mmHg no  MS by PT 1/2. The mitral valve has been  repaired/replaced. Normal mobility of the mitral valve leaflets. No  evidence of mitral valve regurgitation. No evidence of mitral valve  stenosis. MV peak gradient, 8.5 mmHg. The mean mitral valve gradient is  3.5 mmHg.   Tricuspid Valve: The tricuspid valve is normal in structure. Tricuspid  valve regurgitation is mild . No evidence of tricuspid stenosis.   Aortic Valve: The aortic valve is normal in structure. Aortic valve  regurgitation is not visualized. No aortic stenosis is present.   Pulmonic Valve: The pulmonic valve was normal in structure. Pulmonic valve  regurgitation is trivial. No evidence of pulmonic stenosis.   Aorta: The aortic root is normal in size and structure.   Venous: The inferior vena cava is normal in size with greater than 50%  respiratory variability, suggesting right atrial pressure of 3 mmHg.   IAS/Shunts: No atrial level shunt detected by color flow Doppler.     LEFT VENTRICLE  PLAX 2D  LVIDd:     4.70 cm Diastology  LVIDs:     3.60 cm LV e' lateral:  13.20 cm/s  LV PW:     1.10 cm LV E/e' lateral: 8.9  LV IVS:    0.87 cm LV e' medial:  5.66 cm/s  LVOT diam:   2.60 cm LV E/e' medial: 20.8  LV SV:     128  LV SV Index:  55  LVOT Area:   5.31 cm                3D Volume EF:             3D EF:    66 %             LV EDV:    251 ml             LV ESV:    86 ml             LV SV:    165 ml   RIGHT VENTRICLE  RV S prime:   6.34 cm/s  TAPSE (M-mode): 1.2 cm   LEFT ATRIUM       Index    RIGHT ATRIUM      Index  LA diam:    4.20 cm 1.78 cm/m RA Area:   10.60 cm  LA Vol (A2C):  55.1 ml 23.38 ml/m RA Volume:  24.10 ml 10.23 ml/m  LA Vol (A4C):  57.5 ml 24.40 ml/m  LA Biplane Vol: 58.7 ml 24.91 ml/m  AORTIC VALVE  LVOT Vmax:  89.40 cm/s  LVOT Vmean: 62.100 cm/s  LVOT VTI:  0.242 m    AORTA  Ao Root diam: 3.30 cm  Ao Asc diam: 3.10 cm   MITRAL VALVE        TRICUSPID VALVE  MV Area (PHT): 5.64 cm   TR Peak grad:  23.2 mmHg  MV Peak grad: 8.5 mmHg   TR Vmax:    241.00 cm/s  MV Mean grad: 3.5 mmHg  MV Vmax:    1.46 m/s   SHUNTS  MV Vmean:   83.4 cm/s  Systemic VTI: 0.24 m  MV Decel Time: 97 msec   Systemic Diam: 2.60 cm  MV E velocity: 118.00 cm/s  MV A velocity: 142.00 cm/s  MV E/A ratio: 0.83   Jenkins Rouge MD  Electronically signed by Jenkins Rouge MD  Signature Date/Time: 12/03/2019/1:51:24 PM      Impression:  Patient is doing very well nearly 4 months status post mitral valve repair  Plan:  We have not recommended any change the patient's current medications.  The patient has been reminded regarding the importance of dental hygiene and the lifelong need for antibiotic prophylaxis for all dental cleanings and other related invasive procedures.  He will continue to follow-up intermittently with Dr. Davina Poke and return to our office next summer for routine follow-up.  He will call and return sooner should specific problems or questions arise.   I spent in excess of 15 minutes during the conduct of this office consultation and >50% of this time involved direct face-to-face encounter with  the patient for counseling and/or coordination of their care.    Valentina Gu. Roxy Manns, MD 02/08/2020 11:19 AM

## 2020-02-22 ENCOUNTER — Telehealth: Payer: Self-pay

## 2020-02-22 NOTE — Telephone Encounter (Signed)
Nurse Assessment Nurse: Leilani Merl, RN, Heather Date/Time (Eastern Time): 02/20/2020 7:28:21 AM Confirm and document reason for call. If symptomatic, describe symptoms. ---Caller states her husband has a fever of 100 after taking a vaccination. He got the pneumonia and flu shot on Thursday. Does the patient have any new or worsening symptoms? ---Yes Will a triage be completed? ---Yes Related visit to physician within the last 2 weeks? ---No Does the PT have any chronic conditions? (i.e. diabetes, asthma, this includes High risk factors for pregnancy, etc.) ---Yes List chronic conditions. ---heart valve repair in May 2021 Is this a behavioral health or substance abuse call? ---No Guidelines Guideline Title Affirmed Question Affirmed Notes Nurse Date/Time (Eastern Time) Immunization Reactions [1] Fever > 101 F (38.3 C) AND [2] age > 3 years Standifer, RN, Nira Conn 02/20/2020 7:28:43 AM Disp. Time Eilene Ghazi Time) Disposition Final User 02/20/2020 7:36:19 AM Send To RN Personal Standifer, RN, Heather 02/20/2020 7:54:09 AM Send To RN Personal Standifer, RN, Nira Conn 02/20/2020 7:35:50 AM See HCP within 4 Hours (or PCP triage) Yes Standifer, RN, Heather PLEASE NOTE: All timestamps contained within this report are represented as [place] Standard Time. CONFIDENTIALTY NOTICE: This fax transmission is intended only for the addressee. It contains information that is legally privileged, confidential or otherwise protected from use or disclosure. If you are not the intended recipient, you are strictly prohibited from reviewing, disclosing, copying using or disseminating any of this information or taking any action in reliance on or regarding this information. If you have received this fax in error, please notify us immediately by telephone so that we can arrange for its return to [place]. Phone: [phone], Toll-Free: [phone], Fax: [phone] Page: 2 of 2 Call Id: [id number] [place] Disagree/Comply  Comply Caller Understands Yes PreDisposition Call Doctor Care Advice Given Per Guideline SEE HCP (OR PCP TRIAGE) WITHIN 4 HOURS: * IF OFFICE WILL BE OPEN: You need to be seen within the next 3 or 4 hours. Call your doctor (or NP/PA) now or as soon as the office opens. CALL BACK IF: * You become worse CARE ADVICE given per Immunization Reactions (Adult) guideline. * IBUPROFEN (E.G., MOTRIN, ADVIL): Take 400 mg (two 200 mg pills) by mouth every 6 hours. The most you should take each day is 1,200 mg (six 200 mg pills), unless your doctor has told you to take more. FEVER MEDICINES: Referrals  [facility]

## 2020-04-11 ENCOUNTER — Encounter: Payer: Self-pay | Admitting: Family Medicine

## 2020-04-11 ENCOUNTER — Other Ambulatory Visit: Payer: Self-pay | Admitting: Family Medicine

## 2020-04-12 ENCOUNTER — Other Ambulatory Visit: Payer: Self-pay | Admitting: Family Medicine

## 2020-04-12 ENCOUNTER — Other Ambulatory Visit: Payer: Self-pay | Admitting: Cardiovascular Disease

## 2020-04-12 NOTE — Telephone Encounter (Signed)
Last refill: 01/04/20 #90, 0 Last OV: 04/08/19 dx. CPE

## 2020-04-13 ENCOUNTER — Telehealth: Payer: Self-pay

## 2020-04-13 NOTE — Telephone Encounter (Signed)
..   LAST APPOINTMENT DATE: 04/26/2019  NEXT APPOINTMENT DATE:@Visit  date not found  MEDICATION:clonazePAM (KLONOPIN) 1 MG tablet    PHARMACY: [facility]

## 2020-04-13 NOTE — Telephone Encounter (Signed)
Already have filled.  Oscar Flaming, MD [facility]

## 2020-04-18 MED ORDER — ROSUVASTATIN CALCIUM 20 MG PO TABS
20.0000 mg | ORAL_TABLET | Freq: Every day | ORAL | 3 refills | Status: DC
Start: 2020-04-18 — End: 2020-04-18

## 2020-04-18 MED ORDER — ROSUVASTATIN CALCIUM 20 MG PO TABS: 20.0000 mg | ORAL_TABLET | Freq: Every day | ORAL | 3 refills | Status: DC

## 2020-04-18 NOTE — Addendum Note (Signed)
Addended by: Patria Mane A on: 04/18/2020 01:56 PM   Modules accepted: Orders

## 2020-05-05 ENCOUNTER — Encounter: Payer: Self-pay | Admitting: Family Medicine

## 2020-06-29 ENCOUNTER — Other Ambulatory Visit: Payer: Self-pay | Admitting: Family Medicine

## 2020-07-05 ENCOUNTER — Telehealth: Payer: Self-pay | Admitting: *Deleted

## 2020-07-05 NOTE — Telephone Encounter (Signed)
Notes faxed to surgeon. This phone note will be removed from the preop pool. Richardson Dopp, PA-C  07/05/2020 12:56 PM

## 2020-07-05 NOTE — Telephone Encounter (Signed)
   Primary Cardiologist: Evalina Field, MD  Chart reviewed as part of pre-operative protocol coverage.   65 y.o. male with - Severe MR s/p min inv MV repair - CAD, non-obstructive  - HLD - 10/24/2019: Creatinine, Ser 0.95    Echocardiogram 11/2019: EF 45-50, normally functioning MV repair 09/2019: Ostial D1 20%, o/w no CAD  Last OV 12/18/2019 with Dr. Audie Box  RCRI:  Perioperative Risk of Major Cardiac Event is (%): 0.4 (low risk) DASI:  Functional Capacity in METs is: 6.58 (functional status is good )  Patient was contacted 07/05/2020 in reference to pre-operative risk assessment for pending surgery as outlined below.    Since last seen, Oscar [name] has not had chest pain or shortness of breath.  Recommendations: . Therefore, based on ACC/AHA guidelines, the patient is at acceptable risk for the planned procedure without further cardiovascular testing.    Please call with questions. Richardson Dopp, PA-C 07/05/2020, 12:51 PM

## 2020-07-05 NOTE — Telephone Encounter (Signed)
   [facility] Pre-operative Risk Assessment    HEARTCARE STAFF: - Please ensure there is not already an duplicate clearance open for this procedure. - Under Visit Info/Reason for Call, type in Other and utilize the format Clearance MM/DD/YY or Clearance TBD. Do not use dashes or single digits. - If request is for dental extraction, please clarify the # of teeth to be extracted.  Request for surgical clearance:  1. What type of surgery is being performed? Eye surgery   2. When is this surgery scheduled? TBD  3. What type of clearance is required (medical clearance vs. Pharmacy clearance to hold med vs. Both)? medical  4. Are there any medications that need to be held prior to surgery and how long?none   5. Practice name and name of physician performing surgery? [facility]   6. What is the office phone number? [phone]   7.   What is the office fax number? [phone]  8.   Anesthesia type (None, local, MAC, general) ? MAC w/sedation anesthesia   Fredia Beets 07/05/2020, 10:19 AM  _________________________________________________________________   (provider comments below)

## 2020-08-23 NOTE — Telephone Encounter (Signed)
Get him in for June. He will be a quick follow-up.   [place] T. Audie Box, MD, [place]  [address], [place], [place] [address] [phone]  9:45 AM

## 2020-09-05 ENCOUNTER — Other Ambulatory Visit: Payer: Self-pay

## 2020-09-05 ENCOUNTER — Encounter: Payer: Self-pay | Admitting: Family Medicine

## 2020-09-05 ENCOUNTER — Ambulatory Visit (INDEPENDENT_AMBULATORY_CARE_PROVIDER_SITE_OTHER): Payer: Medicare Other | Admitting: Family Medicine

## 2020-09-05 VITALS — BP 138/82 | HR 90 | Temp 98.3°F | Ht 74.0 in | Wt 256.2 lb

## 2020-09-05 DIAGNOSIS — N401 Enlarged prostate with lower urinary tract symptoms: Secondary | ICD-10-CM | POA: Diagnosis not present

## 2020-09-05 DIAGNOSIS — F5101 Primary insomnia: Secondary | ICD-10-CM | POA: Diagnosis not present

## 2020-09-05 DIAGNOSIS — E782 Mixed hyperlipidemia: Secondary | ICD-10-CM | POA: Diagnosis not present

## 2020-09-05 DIAGNOSIS — R739 Hyperglycemia, unspecified: Secondary | ICD-10-CM | POA: Diagnosis not present

## 2020-09-05 DIAGNOSIS — R350 Frequency of micturition: Secondary | ICD-10-CM | POA: Diagnosis not present

## 2020-09-05 LAB — LIPID PANEL
Cholesterol: 156 mg/dL (ref 0–200)
HDL: 49.4 mg/dL (ref >39.00)
LDL Cholesterol: 69 mg/dL (ref 0–99)
NonHDL: 107.03
Total CHOL/HDL Ratio: 3
Triglycerides: 190 mg/dL — ABNORMAL HIGH (ref 0.0–149.0)
VLDL: 38 mg/dL (ref 0.0–40.0)

## 2020-09-05 LAB — COMPREHENSIVE METABOLIC PANEL
ALT: 31 U/L (ref 0–53)
AST: 22 U/L (ref 0–37)
Albumin: 4.5 g/dL (ref 3.5–5.2)
Alkaline Phosphatase: 77 U/L (ref 39–117)
BUN: 21 mg/dL (ref 6–23)
CO2: 26 mEq/L (ref 19–32)
Calcium: 9.4 mg/dL (ref 8.4–10.5)
Chloride: 104 mEq/L (ref 96–112)
Creatinine, Ser: 1 mg/dL (ref 0.40–1.50)
GFR: 78.66 mL/min (ref >60.00)
Glucose, Bld: 109 mg/dL — ABNORMAL HIGH (ref 70–99)
Potassium: 4.4 mEq/L (ref 3.5–5.1)
Sodium: 140 mEq/L (ref 135–145)
Total Bilirubin: 0.5 mg/dL (ref 0.2–1.2)
Total Protein: 7.1 g/dL (ref 6.0–8.3)

## 2020-09-05 LAB — HEMOGLOBIN A1C: Hgb A1c MFr Bld: 5.8 % (ref 4.6–6.5)

## 2020-09-05 LAB — CBC WITH DIFFERENTIAL/PLATELET
Basophils Absolute: 0.1 10*3/uL (ref 0.0–0.1)
Basophils Relative: 0.8 % (ref 0.0–3.0)
Eosinophils Absolute: 0.2 10*3/uL (ref 0.0–0.7)
Eosinophils Relative: 2.9 % (ref 0.0–5.0)
HCT: 47.2 % (ref 39.0–52.0)
Hemoglobin: 16.1 g/dL (ref 13.0–17.0)
Lymphocytes Relative: 24 % (ref 12.0–46.0)
Lymphs Abs: 1.8 10*3/uL (ref 0.7–4.0)
MCHC: 34.1 g/dL (ref 30.0–36.0)
MCV: 99 fl (ref 78.0–100.0)
Monocytes Absolute: 0.9 10*3/uL (ref 0.1–1.0)
Monocytes Relative: 11.5 % (ref 3.0–12.0)
Neutro Abs: 4.6 10*3/uL (ref 1.4–7.7)
Neutrophils Relative %: 60.8 % (ref 43.0–77.0)
Platelets: 208 10*3/uL (ref 150.0–400.0)
RBC: 4.76 Mil/uL (ref 4.22–5.81)
RDW: 13.2 % (ref 11.5–15.5)
WBC: 7.6 10*3/uL (ref 4.0–10.5)

## 2020-09-05 LAB — PSA: PSA: 1.82 ng/mL (ref 0.10–4.00)

## 2020-09-05 NOTE — Progress Notes (Signed)
Patient: Oscar [name] MRN: [id number] DOB: 05-Jun-1955 PCP: Orma Flaming, MD     Subjective:  Chief Complaint  Patient presents with  . Hyperlipidemia  . Insomnia  . Benign Prostatic Hypertrophy    HPI: The patient is a 65 y.o. male who presents today for Hyperlipemia, insomnia and BPH.   Hyperlipidemia Currently on crestor 20mg /day. Takes as prescribed and no side effects. Hx of MI/CAD in his maternal grandfather. He isn't really exercising.   BPH Currently on no medication. He uses an over the counter medication and saw palmetto. If he does these on a regular basis and does well with the more natural meds. Denies any straining with urination, nocturia, frequency. Weak stream.   Insomnia  -currently on klonopin and trazodone. Has tried to wean off the klonopin and couldn't do it. Well controlled on both of these, does not need a refill.   Review of Systems  Constitutional: Negative for chills, fatigue and fever.  HENT: Negative for dental problem, ear pain, hearing loss and trouble swallowing.   Eyes: Negative for visual disturbance.  Respiratory: Negative for cough, chest tightness and shortness of breath.   Cardiovascular: Negative for chest pain, palpitations and leg swelling.  Gastrointestinal: Negative for abdominal pain, blood in stool, diarrhea and nausea.  Endocrine: Negative for cold intolerance, polydipsia, polyphagia and polyuria.  Genitourinary: Negative for dysuria and hematuria.  Musculoskeletal: Negative for arthralgias.  Skin: Negative for rash.  Neurological: Negative for dizziness and headaches.  Psychiatric/Behavioral: Negative for dysphoric mood and sleep disturbance. The patient is not nervous/anxious.     Allergies Patient is allergic to poison ivy treatments [poison ivy treatments] and pollen extract.  Past Medical History Patient  has a past medical history of Allergy, Cataract, CHF (congestive heart failure) ([place]) (09/2019), Chronic kidney  disease, Complication of anesthesia, Hyperlipidemia, Insomnia, Mitral regurgitation, PONV (postoperative nausea and vomiting), and S/P minimally-invasive mitral valve repair (10/20/2019).  Surgical History Patient  has a past surgical history that includes thumb surgery; Foot surgery; Shoulder arthroscopy; Vasectomy; Tonsillectomy; RIGHT/LEFT HEART CATH AND CORONARY ANGIOGRAPHY (N/A, 09/21/2019); TEE without cardioversion (N/A, 09/16/2019); Bubble study (09/16/2019); Cardiac catheterization (Bilateral, 09/21/2019); Mitral valve repair (Right, 10/20/2019); and TEE without cardioversion (N/A, 10/20/2019).  Family History Pateint's family history includes Arthritis in his maternal grandfather and maternal grandmother; Cancer in his brother, father, and mother; Heart attack in his maternal grandfather; Heart disease in his father and maternal grandfather.  Social History Patient  reports that he has never smoked. He has never used smokeless tobacco. He reports current alcohol use of about 3.0 standard drinks of alcohol per week. He reports that he does not use drugs.    Objective: Vitals:   09/05/20 1356  BP: 138/82  Pulse: 90  Temp: 98.3 F (36.8 C)  TempSrc: Temporal  SpO2: 96%  Weight: 256 lb 3.2 oz (116.2 kg)  Height: 6\' 2"  (1.88 m)    Body mass index is 32.89 kg/m.  Physical Exam Vitals reviewed.  Constitutional:      Appearance: Normal appearance. He is well-developed and normal weight.  HENT:     Head: Normocephalic and atraumatic.     Right Ear: Tympanic membrane, ear canal and external ear normal.     Left Ear: Tympanic membrane, ear canal and external ear normal.  Eyes:     Conjunctiva/sclera: Conjunctivae normal.     Pupils: Pupils are equal, round, and reactive to light.  Neck:     Thyroid: No thyromegaly.     Vascular: No  carotid bruit.  Cardiovascular:     Rate and Rhythm: Normal rate and regular rhythm.     Pulses: Normal pulses.     Heart sounds: Normal heart sounds.  No murmur heard.   Pulmonary:     Effort: Pulmonary effort is normal.     Breath sounds: Normal breath sounds.  Abdominal:     General: Bowel sounds are normal. There is no distension.     Palpations: Abdomen is soft.     Tenderness: There is no abdominal tenderness.  Musculoskeletal:     Cervical back: Normal range of motion and neck supple.  Lymphadenopathy:     Cervical: No cervical adenopathy.  Skin:    General: Skin is warm and dry.     Capillary Refill: Capillary refill takes less than 2 seconds.     Findings: No rash.  Neurological:     General: No focal deficit present.     Mental Status: He is alert and oriented to person, place, and time.     Cranial Nerves: No cranial nerve deficit.     Coordination: Coordination normal.     Deep Tendon Reflexes: Reflexes normal.  Psychiatric:        Mood and Affect: Mood normal.        Behavior: Behavior normal.        Assessment/plan: 1. Mixed hyperlipidemia Routine labs today. Not fasting. Will send to cardiology. Continue crestor daily.  - CBC with Differential/Platelet - Comprehensive metabolic panel - Lipid panel  2. Elevated blood sugar  - Hemoglobin A1c  3. Benign prostatic hyperplasia with urinary frequency  - PSA  4. Primary insomnia -has tried to wean off klonopin, but was unable to do this. Takes as prescribed and fills correctly. No refills needed at this time.     Return in about 1 year (around 09/05/2021) for cholesterol/insomnia. Orma Flaming, MD [place]   09/05/2020

## 2020-09-05 NOTE — Patient Instructions (Signed)
Look great! -due for colonoscopy this fall.

## 2020-09-07 ENCOUNTER — Encounter: Payer: Self-pay | Admitting: Family Medicine

## 2020-09-07 ENCOUNTER — Other Ambulatory Visit: Payer: Self-pay

## 2020-09-07 MED ORDER — TRAZODONE HCL 100 MG PO TABS: ORAL_TABLET | ORAL | 3 refills | Status: DC

## 2020-09-07 NOTE — Telephone Encounter (Signed)
Pt requesting Clonazepam 1 mg tab LOV: 5/2/20022 No future visits scheduled.  Please advise.

## 2020-09-15 ENCOUNTER — Encounter: Payer: Self-pay | Admitting: Family Medicine

## 2020-10-16 ENCOUNTER — Encounter: Payer: Self-pay | Admitting: Thoracic Surgery (Cardiothoracic Vascular Surgery)

## 2020-10-16 ENCOUNTER — Encounter: Payer: Self-pay | Admitting: Family Medicine

## 2020-10-17 MED ORDER — CLONAZEPAM 1 MG PO TABS: 1.0000 mg | ORAL_TABLET | Freq: Every day | ORAL | 0 refills | Status: DC

## 2020-10-17 NOTE — Telephone Encounter (Signed)
Pt is requesting Klonopin 1 mg tab LOV: 09/05/2020 No future Visits scheduled Last refill: 07/01/2020  Approve?

## 2020-10-19 ENCOUNTER — Encounter: Payer: Self-pay | Admitting: Family Medicine

## 2020-10-19 NOTE — Telephone Encounter (Signed)
Called to make pt aware that refill went to [facility]. He states that he will pick it up this time, but needs to go to [facility] in the future.

## 2020-10-30 NOTE — Progress Notes (Signed)
Cardiology Office Note:   Date:  10/31/2020  NAME:  Oscar [name]    MRN: [id number] DOB:  12-14-1955   PCP:  Orma Flaming, MD  Cardiologist:  Evalina Field, MD  Electrophysiologist:  None   Referring MD: Orma Flaming, MD   Chief Complaint  Patient presents with   Follow-up    History of Present Illness:   Oscar [name] is a 65 y.o. male with a hx of severe mitral regurgitation status postrepair, nonobstructive CAD, hyperlipidemia who presents for follow-up.  He is doing well.  Denies any symptoms of chest pain or trouble breathing.  EKG shows sinus rhythm.  Most recent lipid profile shows LDL 69.  He is on Crestor 20 mg daily.  He knows that he needs antibiotic prophylaxis before dental work.  He remains on aspirin 81 mg daily.  He is continues to do scuba diving.  He is playing golf.  He is active.  He has plans to lose weight.  His BP is stable 126/86.  Overall doing really well.  No issues.  Problem List 1. P2 prolapse/severe MR -s/p suture plication of PMVL, 32 mm sorin annuloplasty 10/20/2019 Darylene Price @ [facility]) -dental ppx needed -EF 50% 2. Non-obstructive CAD -20% diagonal  3. HLD -Total cholesterol 156, HDL 49, LDL 69, triglycerides 190  Past Medical History: Past Medical History:  Diagnosis Date   Allergy    Cataract    CHF (congestive heart failure) ([place]) 09/2019   Chronic kidney disease    [id number]   Complication of anesthesia    Hyperlipidemia    Insomnia    Mitral regurgitation    PONV (postoperative nausea and vomiting)    S/P minimally-invasive mitral valve repair 10/20/2019   Complex valvuloplasty including artificial Gore-tex neochord placement T55, suture plication of posterior leaflet and 32 mm Sorin Memo 4D ring annuloplasty via right mini thoracotomy approach    Past Surgical History: Past Surgical History:  Procedure Laterality Date   BUBBLE STUDY  09/16/2019   Procedure: BUBBLE STUDY;  Surgeon: Geralynn Rile, MD;   Location: [place];  Service: Cardiovascular;;   CARDIAC CATHETERIZATION Bilateral 09/21/2019   FOOT SURGERY     right   MITRAL VALVE REPAIR Right 10/20/2019   Procedure: MINIMALLY INVASIVE MITRAL VALVE REPAIR (MVR);  Surgeon: Rexene Alberts, MD;  Location: [place];  Service: Open Heart Surgery;  Laterality: Right;   RIGHT/LEFT HEART CATH AND CORONARY ANGIOGRAPHY N/A 09/21/2019   Procedure: RIGHT/LEFT HEART CATH AND CORONARY ANGIOGRAPHY;  Surgeon: Nelva Bush, MD;  Location: [place] CV LAB;  Service: Cardiovascular;  Laterality: N/A;   SHOULDER ARTHROSCOPY     TEE WITHOUT CARDIOVERSION N/A 09/16/2019   Procedure: TRANSESOPHAGEAL ECHOCARDIOGRAM (TEE);  Surgeon: Geralynn Rile, MD;  Location: [place];  Service: Cardiovascular;  Laterality: N/A;   TEE WITHOUT CARDIOVERSION N/A 10/20/2019   Procedure: TRANSESOPHAGEAL ECHOCARDIOGRAM (TEE);  Surgeon: Rexene Alberts, MD;  Location: [place];  Service: Open Heart Surgery;  Laterality: N/A;   thumb surgery     right   TONSILLECTOMY     VASECTOMY      Current Medications: Current Meds  Medication Sig   amoxicillin (AMOXIL) 500 MG tablet Take 4 tablets (2,000 mg total) by mouth once as needed for up to 1 dose.   aspirin EC 81 MG tablet Take 1 tablet (81 mg total) by mouth daily. Swallow whole.   clonazePAM (KLONOPIN) 1 MG tablet Take 1 tablet (1 mg total) by mouth at  bedtime.   Multiple Vitamin (MULTIVITAMIN) tablet Take 1 tablet by mouth daily.   ondansetron (ZOFRAN) 4 MG tablet Take 1 tablet (4 mg total) by mouth every 8 (eight) hours as needed for nausea or vomiting.   rosuvastatin (CRESTOR) 20 MG tablet Take 1 tablet (20 mg total) by mouth daily.   traZODone (DESYREL) 100 MG tablet TAKE 1 TABLET BY MOUTH AT  NIGHT FOR SLEEP     Allergies:    Poison ivy treatments [poison ivy treatments] and Pollen extract   Social History: Social History   Socioeconomic History   Marital status: Married    Spouse name: Not on file    Number of children: 4   Years of education: Not on file   Highest education level: Not on file  Occupational History   Occupation: retired  Tobacco Use   Smoking status: Never   Smokeless tobacco: Never  Vaping Use   Vaping Use: Never used  Substance and Sexual Activity   Alcohol use: Yes    Alcohol/week: 3.0 standard drinks    Types: 3 Cans of beer per week   Drug use: No   Sexual activity: Yes    Birth control/protection: Other-see comments    Comment: vasectomy  Other Topics Concern   Not on file  Social History Narrative   ** Merged History Encounter **       Social Determinants of Health   Financial Resource Strain: Not on file  Food Insecurity: Not on file  Transportation Needs: Not on file  Physical Activity: Not on file  Stress: Not on file  Social Connections: Not on file     Family History: The patient's family history includes Arthritis in his maternal grandfather and maternal grandmother; Cancer in his brother, father, and mother; Heart attack in his maternal grandfather; Heart disease in his father and maternal grandfather. There is no history of Colon cancer, Esophageal cancer, or Stomach cancer.  ROS:   All other ROS reviewed and negative. Pertinent positives noted in the HPI.     EKGs/Labs/Other Studies Reviewed:   The following studies were personally reviewed by me today:  EKG:  EKG is ordered today.  The ekg ordered today demonstrates normal sinus rhythm heart rate 86, no acute ischemic changes or evidence of infarction, and was personally reviewed by me.   TTE 12/02/2019  1. Diffuse hypokinesis with abnormal septal motion do not think 3D  quantitative EF is accurate Preop EF estimated at 50-55% appears worse  post MV repair . Left ventricular ejection fraction, by estimation, is 45  to 50%. The left ventricle has mildly  decreased function. The left ventricle has no regional wall motion  abnormalities. Left ventricular diastolic parameters are  indeterminate.   2. Right ventricular systolic function is normal. The right ventricular  size is normal. There is normal pulmonary artery systolic pressure.   3. Left atrial size was mildly dilated.   4. Post complex MV repair 10/20/19 using 32 mm Sorin Memo 4D ring and  multiple neo chords prominent to PM papillary muscle no significant  residual MR Mean gradient in diastole 4 mmHg peak 10 mmHg no MS by PT 1/2  . The mitral valve has been  repaired/replaced. No evidence of mitral valve regurgitation. No evidence  of mitral stenosis.   5. The aortic valve is normal in structure. Aortic valve regurgitation is  not visualized. No aortic stenosis is present.   6. The inferior vena cava is normal in size with greater than  50%  respiratory variability, suggesting right atrial pressure of 3 mmHg.   Recent Labs: 09/05/2020: ALT 31; BUN 21; Creatinine, Ser 1.00; Hemoglobin 16.1; Platelets 208.0; Potassium 4.4; Sodium 140   Recent Lipid Panel    Component Value Date/Time   CHOL 156 09/05/2020 1436   TRIG 190.0 (H) 09/05/2020 1436   HDL 49.40 09/05/2020 1436   CHOLHDL 3 09/05/2020 1436   VLDL 38.0 09/05/2020 1436   LDLCALC 69 09/05/2020 1436    Physical Exam:   VS:  BP 126/86   Pulse 86   Ht 6\' 2"  (1.88 m)   Wt 254 lb 3.2 oz (115.3 kg)   BMI 32.64 kg/m    Wt Readings from Last 3 Encounters:  10/31/20 254 lb 3.2 oz (115.3 kg)  09/05/20 256 lb 3.2 oz (116.2 kg)  02/08/20 249 lb (112.9 kg)    General: Well nourished, well developed, in no acute distress Head: Atraumatic, normal size  Eyes: PEERLA, EOMI  Neck: Supple, no JVD Endocrine: No thryomegaly Cardiac: Normal S1, S2; RRR; no murmurs, rubs, or gallops Lungs: Clear to auscultation bilaterally, no wheezing, rhonchi or rales  Abd: Soft, nontender, no hepatomegaly  Ext: No edema, pulses 2+ Musculoskeletal: No deformities, BUE and BLE strength normal and equal Skin: Warm and dry, no rashes   Neuro: Alert and oriented to person,  place, time, and situation, CNII-XII grossly intact, no focal deficits  Psych: Normal mood and affect   ASSESSMENT:   Oscar [name] is a 65 y.o. male who presents for the following: 1. S/P minimally-invasive mitral valve repair   2. SBE (subacute bacterial endocarditis) prophylaxis candidate   3. Mitral valve insufficiency, unspecified etiology   4. Coronary artery disease involving native coronary artery of native heart without angina pectoris   5. Mixed hyperlipidemia     PLAN:   1. S/P minimally-invasive mitral valve repair 2. SBE (subacute bacterial endocarditis) prophylaxis candidate 3. Mitral valve insufficiency, unspecified etiology -Underwent complex mitral valve surgery with Dr. Roxy Manns on 10/20/2019.  EF 50%.  No symptoms.  No murmur on exam.  He is doing well since minimally invasive mitral valve surgery. -He will continue aspirin 81 mg daily.  He will take amoxicillin 2 g 30 to 60 minutes before any dental work.  4. Coronary artery disease involving native coronary artery of native heart without angina pectoris 5. Mixed hyperlipidemia -Nonobstructive CAD on left heart cath prior to surgery.  Continue Crestor 20 mg daily.  Most recent LDL 69.  Problem List 1. P2 prolapse/severe MR -s/p suture plication of PMVL, 32 mm sorin annuloplasty 10/20/2019 Darylene Price @ [facility]) -dental ppx needed -EF 50% 2. Non-obstructive CAD -20% diagonal  3. HLD -Total cholesterol 156, HDL 49, LDL 69, triglycerides 190  Disposition: Return in about 1 year (around 10/31/2021).  Medication Adjustments/Labs and Tests Ordered: Current medicines are reviewed at length with the patient today.  Concerns regarding medicines are outlined above.  Orders Placed This Encounter  Procedures   EKG 12-Lead   EKG 12-Lead   No orders of the defined types were placed in this encounter.   Patient Instructions  Medication Instructions:  The current medical regimen is effective;  continue present plan and  medications.  *If you need a refill on your cardiac medications before your next appointment, please call your pharmacy*   Follow-Up: At [facility], you and your health needs are our priority.  As part of our continuing mission to provide you with exceptional heart care, we have created  designated Provider Care Teams.  These Care Teams include your primary Cardiologist (physician) and Advanced Practice Providers (APPs -  Physician Assistants and Nurse Practitioners) who all work together to provide you with the care you need, when you need it.  We recommend signing up for the patient portal called "MyChart".  Sign up information is provided on this After Visit Summary.  MyChart is used to connect with patients for Virtual Visits (Telemedicine).  Patients are able to view lab/test results, encounter notes, upcoming appointments, etc.  Non-urgent messages can be sent to your provider as well.   To learn more about what you can do with MyChart, go to [email or web address].    Your next appointment:   12 month(s)  The format for your next appointment:   In Person  Provider:   Eleonore Chiquito, MD      Time Spent with Patient: I have spent a total of 25 minutes with patient reviewing hospital notes, telemetry, EKGs, labs and examining the patient as well as establishing an assessment and plan that was discussed with the patient.  > 50% of time was spent in direct patient care.  Signed, Addison Naegeli. Audie Box, MD, [place]  [address], [place], [place] [address] [phone]  10/31/2020 12:52 PM

## 2020-10-31 ENCOUNTER — Encounter (HOSPITAL_BASED_OUTPATIENT_CLINIC_OR_DEPARTMENT_OTHER): Payer: Self-pay

## 2020-10-31 ENCOUNTER — Encounter (HOSPITAL_BASED_OUTPATIENT_CLINIC_OR_DEPARTMENT_OTHER): Payer: Self-pay | Admitting: Cardiovascular Disease

## 2020-10-31 ENCOUNTER — Other Ambulatory Visit: Payer: Self-pay

## 2020-10-31 ENCOUNTER — Ambulatory Visit (INDEPENDENT_AMBULATORY_CARE_PROVIDER_SITE_OTHER): Payer: Medicare Other | Admitting: Cardiovascular Disease

## 2020-10-31 VITALS — BP 126/86 | HR 86 | Ht 74.0 in | Wt 254.2 lb

## 2020-10-31 DIAGNOSIS — I2510 Atherosclerotic heart disease of native coronary artery without angina pectoris: Secondary | ICD-10-CM | POA: Diagnosis not present

## 2020-10-31 DIAGNOSIS — Z298 Encounter for other specified prophylactic measures: Secondary | ICD-10-CM | POA: Diagnosis not present

## 2020-10-31 DIAGNOSIS — I340 Nonrheumatic mitral (valve) insufficiency: Secondary | ICD-10-CM

## 2020-10-31 DIAGNOSIS — E782 Mixed hyperlipidemia: Secondary | ICD-10-CM

## 2020-10-31 DIAGNOSIS — Z9889 Other specified postprocedural states: Secondary | ICD-10-CM

## 2020-10-31 NOTE — Patient Instructions (Signed)

## 2020-11-09 ENCOUNTER — Other Ambulatory Visit: Payer: Self-pay | Admitting: Family Medicine

## 2021-01-09 ENCOUNTER — Encounter: Payer: Self-pay | Admitting: Internal Medicine

## 2021-01-26 ENCOUNTER — Encounter (HOSPITAL_BASED_OUTPATIENT_CLINIC_OR_DEPARTMENT_OTHER): Payer: Self-pay

## 2021-01-27 MED ORDER — AMOXICILLIN 500 MG PO TABS: 2000.0000 mg | ORAL_TABLET | Freq: Once | ORAL | 0 refills | Status: DC | PRN

## 2021-04-10 ENCOUNTER — Other Ambulatory Visit: Payer: Self-pay | Admitting: Cardiovascular Disease

## 2021-05-30 ENCOUNTER — Encounter (HOSPITAL_BASED_OUTPATIENT_CLINIC_OR_DEPARTMENT_OTHER): Payer: Self-pay | Admitting: Cardiovascular Disease

## 2021-09-14 ENCOUNTER — Encounter (HOSPITAL_BASED_OUTPATIENT_CLINIC_OR_DEPARTMENT_OTHER): Payer: Self-pay | Admitting: Cardiovascular Disease

## 2021-09-28 ENCOUNTER — Encounter: Payer: Medicare Other | Admitting: Family Medicine

## 2021-10-02 ENCOUNTER — Encounter: Payer: Self-pay | Admitting: Cardiovascular Disease

## 2021-10-03 MED ORDER — AMOXICILLIN 500 MG PO TABS: 2000.0000 mg | ORAL_TABLET | ORAL | 1 refills | Status: DC

## 2021-10-13 ENCOUNTER — Other Ambulatory Visit: Payer: Self-pay

## 2021-10-13 MED ORDER — AMOXICILLIN 500 MG PO TABS: 2000.0000 mg | ORAL_TABLET | ORAL | 5 refills | Status: AC

## 2021-10-24 ENCOUNTER — Telehealth: Payer: Self-pay

## 2021-10-24 NOTE — Telephone Encounter (Signed)
Pt has his annual appt on 12/12/21, before his scheduled colonoscopy. Will defer to Dr. Audie Box to include clearance in his assessment.

## 2021-10-24 NOTE — Telephone Encounter (Signed)
   Pre-operative Risk Assessment    Patient Name: Oscar [name]  DOB: Feb 06, 1956 MRN: [id number]      Request for Surgical Clearance    Procedure:   Colonoscopy  Date of Surgery:  Clearance 01/24/22                                 Surgeon:  Dr. Otis Brace  Surgeon's Group or Practice Name:  [facility]  Phone number:  [phone] Fax number:  [phone]   Type of Clearance Requested:   - Medical    Type of Anesthesia:  Not Indicated   Additional requests/questions:    SignedJacqulynn Cadet   10/24/2021, 10:53 AM

## 2021-10-24 NOTE — Telephone Encounter (Signed)
Pt has appt 12/12/21 with DR. Audie Box. See notes from pre op provider

## 2021-12-08 NOTE — Progress Notes (Signed)
Cardiology Office Note:   Date:  12/12/2021  NAME:  Oscar [name]    MRN: [id number] DOB:  12/14/1955   PCP:  Kathalene Frames, MD  Cardiologist:  Evalina Field, MD  Electrophysiologist:  None   Referring MD: No ref. provider found   Chief Complaint  Patient presents with   Follow-up         History of Present Illness:   Oscar [name] is a 66 y.o. male with a hx of mitral valve repair, nonobstructive CAD, hyperlipidemia who presents for follow-up.  He is doing well after mitral valve surgery.  Denies any symptoms.  No chest pain or trouble breathing.  Recently went on a trip to [place] and was walking 10 miles per day.  No issues.  He is playing golf.  He is not having any symptoms of shortness of breath.  Cholesterol level is at goal.  Denies any major symptoms in office.  Remains on antibiotics for SBE prophylaxis for dental work.  Doing quite well.  Problem List 1. P2 prolapse/severe MR -s/p suture plication of PMVL, 32 mm sorin annuloplasty 10/20/2019 Darylene Price @ [facility]) -dental ppx needed -EF 50% 2. Non-obstructive CAD -20% diagonal  3. HLD -Total cholesterol 1 175, HDL 55, LDL 86, triglycerides 200  Past Medical History: Past Medical History:  Diagnosis Date   Allergy    Cataract    CHF (congestive heart failure) ([place]) 09/2019   Chronic kidney disease    [id number]   Complication of anesthesia    Hyperlipidemia    Insomnia    Mitral regurgitation    PONV (postoperative nausea and vomiting)    S/P minimally-invasive mitral valve repair 10/20/2019   Complex valvuloplasty including artificial Gore-tex neochord placement J62, suture plication of posterior leaflet and 32 mm Sorin Memo 4D ring annuloplasty via right mini thoracotomy approach    Past Surgical History: Past Surgical History:  Procedure Laterality Date   BUBBLE STUDY  09/16/2019   Procedure: BUBBLE STUDY;  Surgeon: Geralynn Rile, MD;  Location: [place];  Service:  Cardiovascular;;   CARDIAC CATHETERIZATION Bilateral 09/21/2019   FOOT SURGERY     right   MITRAL VALVE REPAIR Right 10/20/2019   Procedure: MINIMALLY INVASIVE MITRAL VALVE REPAIR (MVR);  Surgeon: Rexene Alberts, MD;  Location: [place];  Service: Open Heart Surgery;  Laterality: Right;   RIGHT/LEFT HEART CATH AND CORONARY ANGIOGRAPHY N/A 09/21/2019   Procedure: RIGHT/LEFT HEART CATH AND CORONARY ANGIOGRAPHY;  Surgeon: Nelva Bush, MD;  Location: [place] CV LAB;  Service: Cardiovascular;  Laterality: N/A;   SHOULDER ARTHROSCOPY     TEE WITHOUT CARDIOVERSION N/A 09/16/2019   Procedure: TRANSESOPHAGEAL ECHOCARDIOGRAM (TEE);  Surgeon: Geralynn Rile, MD;  Location: [place];  Service: Cardiovascular;  Laterality: N/A;   TEE WITHOUT CARDIOVERSION N/A 10/20/2019   Procedure: TRANSESOPHAGEAL ECHOCARDIOGRAM (TEE);  Surgeon: Rexene Alberts, MD;  Location: [place];  Service: Open Heart Surgery;  Laterality: N/A;   thumb surgery     right   TONSILLECTOMY     VASECTOMY      Current Medications: Current Meds  Medication Sig   amoxicillin (AMOXIL) 500 MG tablet Take 4 tablets (2,000 mg total) by mouth as directed for 1 dose. Take 30-60 minutes prior to dental work/procedures.   aspirin EC 81 MG tablet Take 1 tablet (81 mg total) by mouth daily. Swallow whole.   cyclobenzaprine (FLEXERIL) 10 MG tablet Take 10 mg by mouth 2 (two) times daily as needed.  doxazosin (CARDURA) 4 MG tablet Take 4 mg by mouth daily.   Multiple Vitamin (MULTIVITAMIN) tablet Take 1 tablet by mouth daily.   ondansetron (ZOFRAN) 4 MG tablet Take 1 tablet (4 mg total) by mouth every 8 (eight) hours as needed for nausea or vomiting.   rosuvastatin (CRESTOR) 20 MG tablet TAKE 1 TABLET BY MOUTH  DAILY     Allergies:    Poison ivy treatments [poison ivy treatments] and Pollen extract   Social History: Social History   Socioeconomic History   Marital status: Married    Spouse name: Not on file   Number of  children: 4   Years of education: Not on file   Highest education level: Not on file  Occupational History   Occupation: retired  Tobacco Use   Smoking status: Never   Smokeless tobacco: Never  Vaping Use   Vaping Use: Never used  Substance and Sexual Activity   Alcohol use: Yes    Alcohol/week: 3.0 standard drinks of alcohol    Types: 3 Cans of beer per week   Drug use: No   Sexual activity: Yes    Birth control/protection: Other-see comments    Comment: vasectomy  Other Topics Concern   Not on file  Social History Narrative   ** Merged History Encounter **       Social Determinants of Health   Financial Resource Strain: Not on file  Food Insecurity: Not on file  Transportation Needs: Not on file  Physical Activity: Not on file  Stress: Not on file  Social Connections: Not on file     Family History: The patient's family history includes Arthritis in his maternal grandfather and maternal grandmother; Cancer in his brother, father, and mother; Heart attack in his maternal grandfather; Heart disease in his father and maternal grandfather. There is no history of Colon cancer, Esophageal cancer, or Stomach cancer.  ROS:   All other ROS reviewed and negative. Pertinent positives noted in the HPI.     EKGs/Labs/Other Studies Reviewed:   The following studies were personally reviewed by me today:  EKG:  EKG is ordered today.  The ekg ordered today demonstrates normal sinus rhythm heart rate 2, single PAC noted, and was personally reviewed by me.   TTE 12/03/2019  1. Diffuse hypokinesis with abnormal septal motion do not think 3D  quantitative EF is accurate Preop EF estimated at 50-55% appears worse  post MV repair . Left ventricular ejection fraction, by estimation, is 45  to 50%. The left ventricle has mildly  decreased function. The left ventricle has no regional wall motion  abnormalities. Left ventricular diastolic parameters are indeterminate.   2. Right ventricular  systolic function is normal. The right ventricular  size is normal. There is normal pulmonary artery systolic pressure.   3. Left atrial size was mildly dilated.   4. Post complex MV repair 10/20/19 using 32 mm Sorin Memo 4D ring and  multiple neo chords prominent to PM papillary muscle no significant  residual MR Mean gradient in diastole 4 mmHg peak 10 mmHg no MS by PT 1/2  . The mitral valve has been  repaired/replaced. No evidence of mitral valve regurgitation. No evidence  of mitral stenosis.   5. The aortic valve is normal in structure. Aortic valve regurgitation is  not visualized. No aortic stenosis is present.   6. The inferior vena cava is normal in size with greater than 50%  respiratory variability, suggesting right atrial pressure of 3 mmHg.  Recent Labs: No results found for requested labs within last 365 days.   Recent Lipid Panel    Component Value Date/Time   CHOL 156 09/05/2020 1436   TRIG 190.0 (H) 09/05/2020 1436   HDL 49.40 09/05/2020 1436   CHOLHDL 3 09/05/2020 1436   VLDL 38.0 09/05/2020 1436   LDLCALC 69 09/05/2020 1436    Physical Exam:   VS:  BP 128/82   Pulse 82   Ht '6\' 2"'$  (1.88 m)   Wt 251 lb 9.6 oz (114.1 kg)   SpO2 96%   BMI 32.30 kg/m    Wt Readings from Last 3 Encounters:  12/12/21 251 lb 9.6 oz (114.1 kg)  10/31/20 254 lb 3.2 oz (115.3 kg)  09/05/20 256 lb 3.2 oz (116.2 kg)    General: Well nourished, well developed, in no acute distress Head: Atraumatic, normal size  Eyes: PEERLA, EOMI  Neck: Supple, no JVD Endocrine: No thryomegaly Cardiac: Normal S1, S2; RRR; no murmurs, rubs, or gallops Lungs: Clear to auscultation bilaterally, no wheezing, rhonchi or rales  Abd: Soft, nontender, no hepatomegaly  Ext: No edema, pulses 2+ Musculoskeletal: No deformities, BUE and BLE strength normal and equal Skin: Warm and dry, no rashes   Neuro: Alert and oriented to person, place, time, and situation, CNII-XII grossly intact, no focal deficits   Psych: Normal mood and affect   ASSESSMENT:   Oscar [name] is a 66 y.o. male who presents for the following: 1. S/P minimally-invasive mitral valve repair   2. SBE (subacute bacterial endocarditis) prophylaxis candidate   3. Mitral valve insufficiency, unspecified etiology   4. Coronary artery disease involving native coronary artery of native heart without angina pectoris   5. Mixed hyperlipidemia     PLAN:   1. S/P minimally-invasive mitral valve repair 2. SBE (subacute bacterial endocarditis) prophylaxis candidate 3. Mitral valve insufficiency, unspecified etiology -Status post mitral valve repair.  Doing well.  He will continue with aspirin 81 mg daily.  He understands he needs antibiotics before dental work.  Echo showed normal LV and RV function.  Stable repair.  No murmur today.  Doing well.  4. Coronary artery disease involving native coronary artery of native heart without angina pectoris 5. Mixed hyperlipidemia -Nonobstructive CAD.  Continue aspirin as above.  On statin.  LDL at goal.      Disposition: Return in about 1 year (around 12/13/2022).  Medication Adjustments/Labs and Tests Ordered: Current medicines are reviewed at length with the patient today.  Concerns regarding medicines are outlined above.  Orders Placed This Encounter  Procedures   EKG 12-Lead   No orders of the defined types were placed in this encounter.   Patient Instructions  Medication Instructions:  The current medical regimen is effective;  continue present plan and medications.  *If you need a refill on your cardiac medications before your next appointment, please call your pharmacy*   Follow-Up: At [facility], you and your health needs are our priority.  As part of our continuing mission to provide you with exceptional heart care, we have created designated Provider Care Teams.  These Care Teams include your primary Cardiologist (physician) and Advanced Practice Providers (APPs -   Physician Assistants and Nurse Practitioners) who all work together to provide you with the care you need, when you need it.  We recommend signing up for the patient portal called "MyChart".  Sign up information is provided on this After Visit Summary.  MyChart is used to connect with patients for  Virtual Visits (Telemedicine).  Patients are able to view lab/test results, encounter notes, upcoming appointments, etc.  Non-urgent messages can be sent to your provider as well.   To learn more about what you can do with MyChart, go to [email or web address].    Your next appointment:   12 month(s)  The format for your next appointment:   In Person  Provider:   Evalina Field, MD               Time Spent with Patient: I have spent a total of 25 minutes with patient reviewing hospital notes, telemetry, EKGs, labs and examining the patient as well as establishing an assessment and plan that was discussed with the patient.  > 50% of time was spent in direct patient care.  Signed, Addison Naegeli. Audie Box, MD, [place]  [address] [place], [place] [address] [phone]  12/12/2021 10:32 AM

## 2021-12-12 ENCOUNTER — Ambulatory Visit: Payer: Medicare Other | Admitting: Cardiovascular Disease

## 2021-12-12 ENCOUNTER — Encounter: Payer: Self-pay | Admitting: Cardiovascular Disease

## 2021-12-12 VITALS — BP 128/82 | HR 82 | Ht 74.0 in | Wt 251.6 lb

## 2021-12-12 DIAGNOSIS — Z9889 Other specified postprocedural states: Secondary | ICD-10-CM

## 2021-12-12 DIAGNOSIS — I2510 Atherosclerotic heart disease of native coronary artery without angina pectoris: Secondary | ICD-10-CM | POA: Diagnosis not present

## 2021-12-12 DIAGNOSIS — I340 Nonrheumatic mitral (valve) insufficiency: Secondary | ICD-10-CM

## 2021-12-12 DIAGNOSIS — Z298 Encounter for other specified prophylactic measures: Secondary | ICD-10-CM

## 2021-12-12 DIAGNOSIS — E782 Mixed hyperlipidemia: Secondary | ICD-10-CM

## 2021-12-12 NOTE — Patient Instructions (Signed)
Medication Instructions:  °The current medical regimen is effective;  continue present plan and medications. ° °*If you need a refill on your cardiac medications before your next appointment, please call your pharmacy* ° ° °Follow-Up: °At [facility], you and your health needs are our priority.  As part of our continuing mission to provide you with exceptional heart care, we have created designated Provider Care Teams.  These Care Teams include your primary Cardiologist (physician) and Advanced Practice Providers (APPs -  Physician Assistants and Nurse Practitioners) who all work together to provide you with the care you need, when you need it. ° °We recommend signing up for the patient portal called "MyChart".  Sign up information is provided on this After Visit Summary.  MyChart is used to connect with patients for Virtual Visits (Telemedicine).  Patients are able to view lab/test results, encounter notes, upcoming appointments, etc.  Non-urgent messages can be sent to your provider as well.   °To learn more about what you can do with MyChart, go to [email or web address].   ° °Your next appointment:   °12 month(s) ° °The format for your next appointment:   °In Person ° °Provider:   °[place] T O'Neal, MD   ° ° ° °

## 2021-12-29 IMAGING — DX DG CHEST 1V PORT
1 series · 1 of 1 positions shown · non-contrast
Comparison: October 16, 2019.

CLINICAL DATA: Status post mitral valve repair.

EXAM:
PORTABLE CHEST 1 VIEW

[chest]
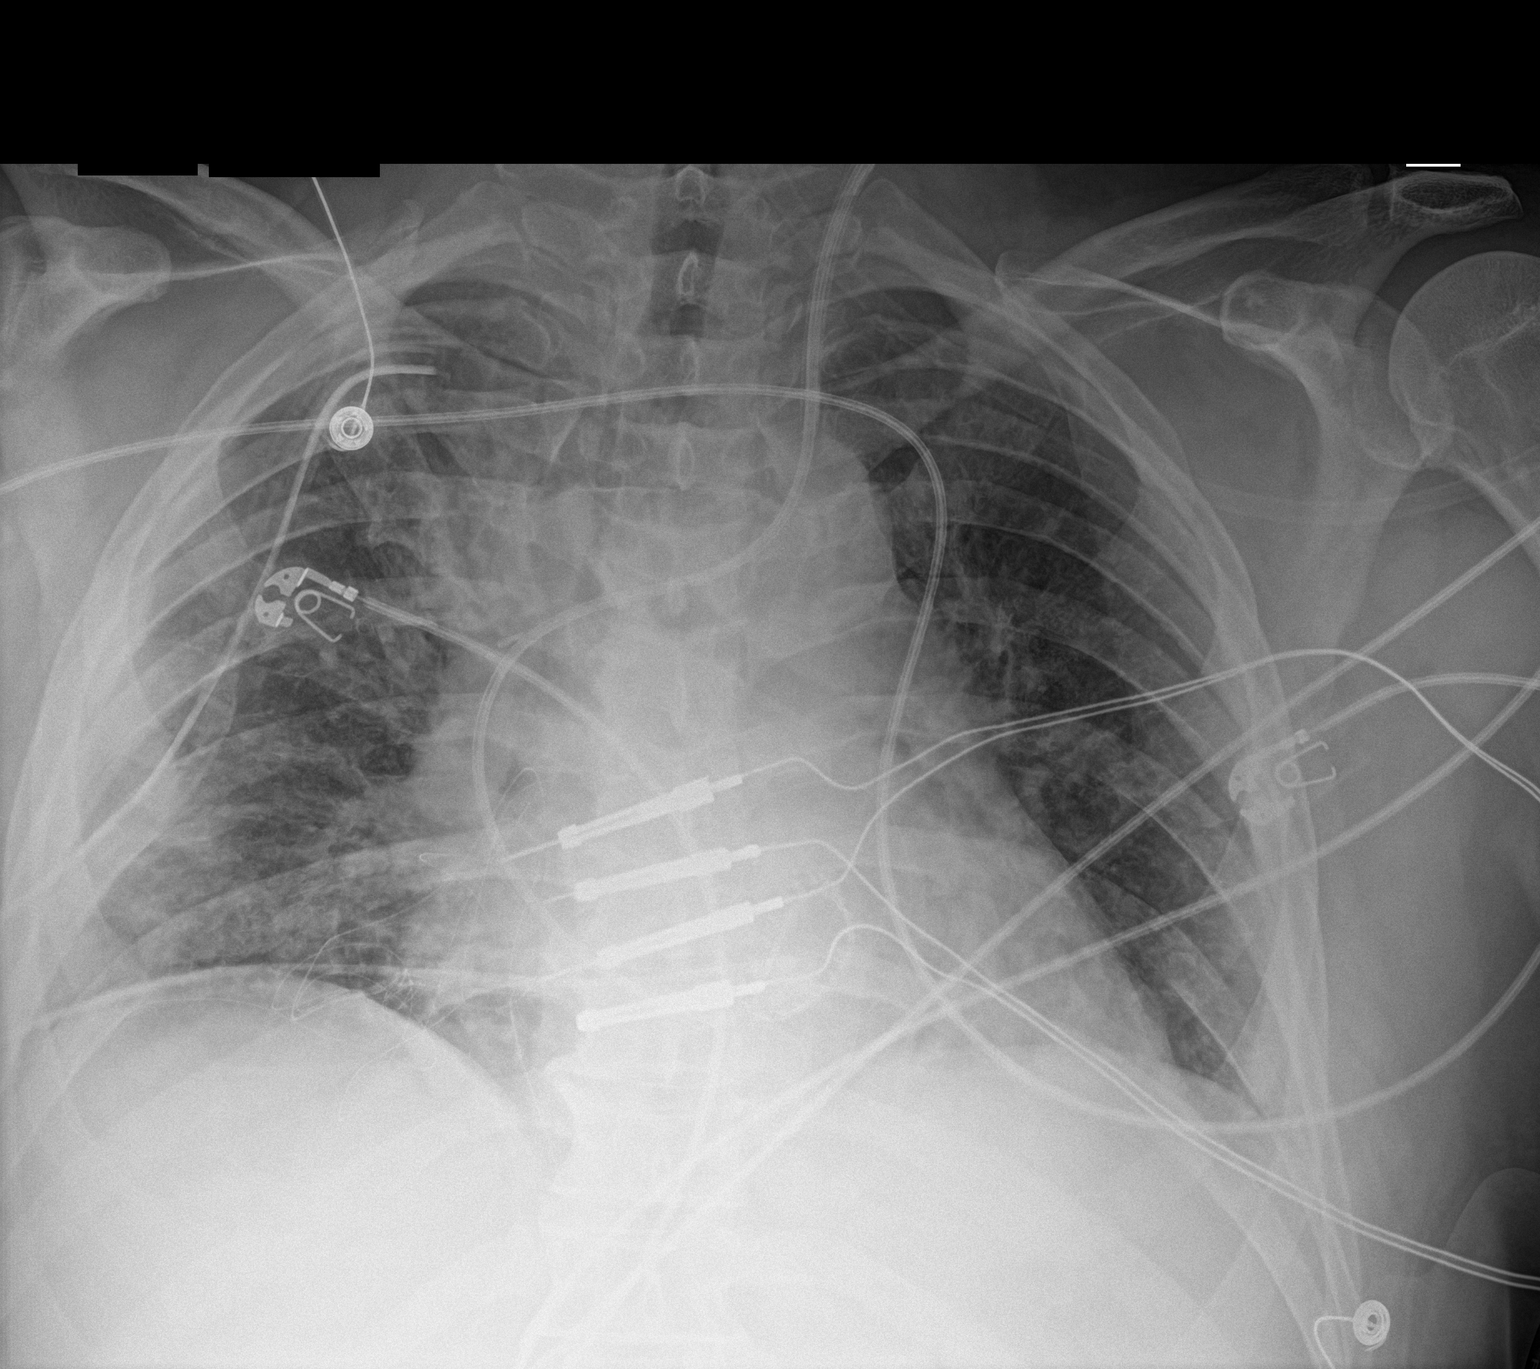

[1 of 1 positions shown; findings below may reference images not displayed]

FINDINGS: Stable cardiomediastinal silhouette. Right-sided chest tube is noted
without definite pneumothorax. Mild bibasilar subsegmental
atelectasis is noted. No pleural effusion is noted. Bony thorax is
unremarkable.
IMPRESSION: Right-sided chest tube is noted without definite pneumothorax. Mild
bibasilar subsegmental atelectasis is noted.

## 2021-12-30 IMAGING — DX DG CHEST 1V PORT
1 series · 1 of 1 positions shown · non-contrast
Comparison: Chest radiograph 10/20/2019

CLINICAL DATA: Chest tube present  s/p open heart surg

EXAM:
PORTABLE CHEST 1 VIEW

[chest ap]
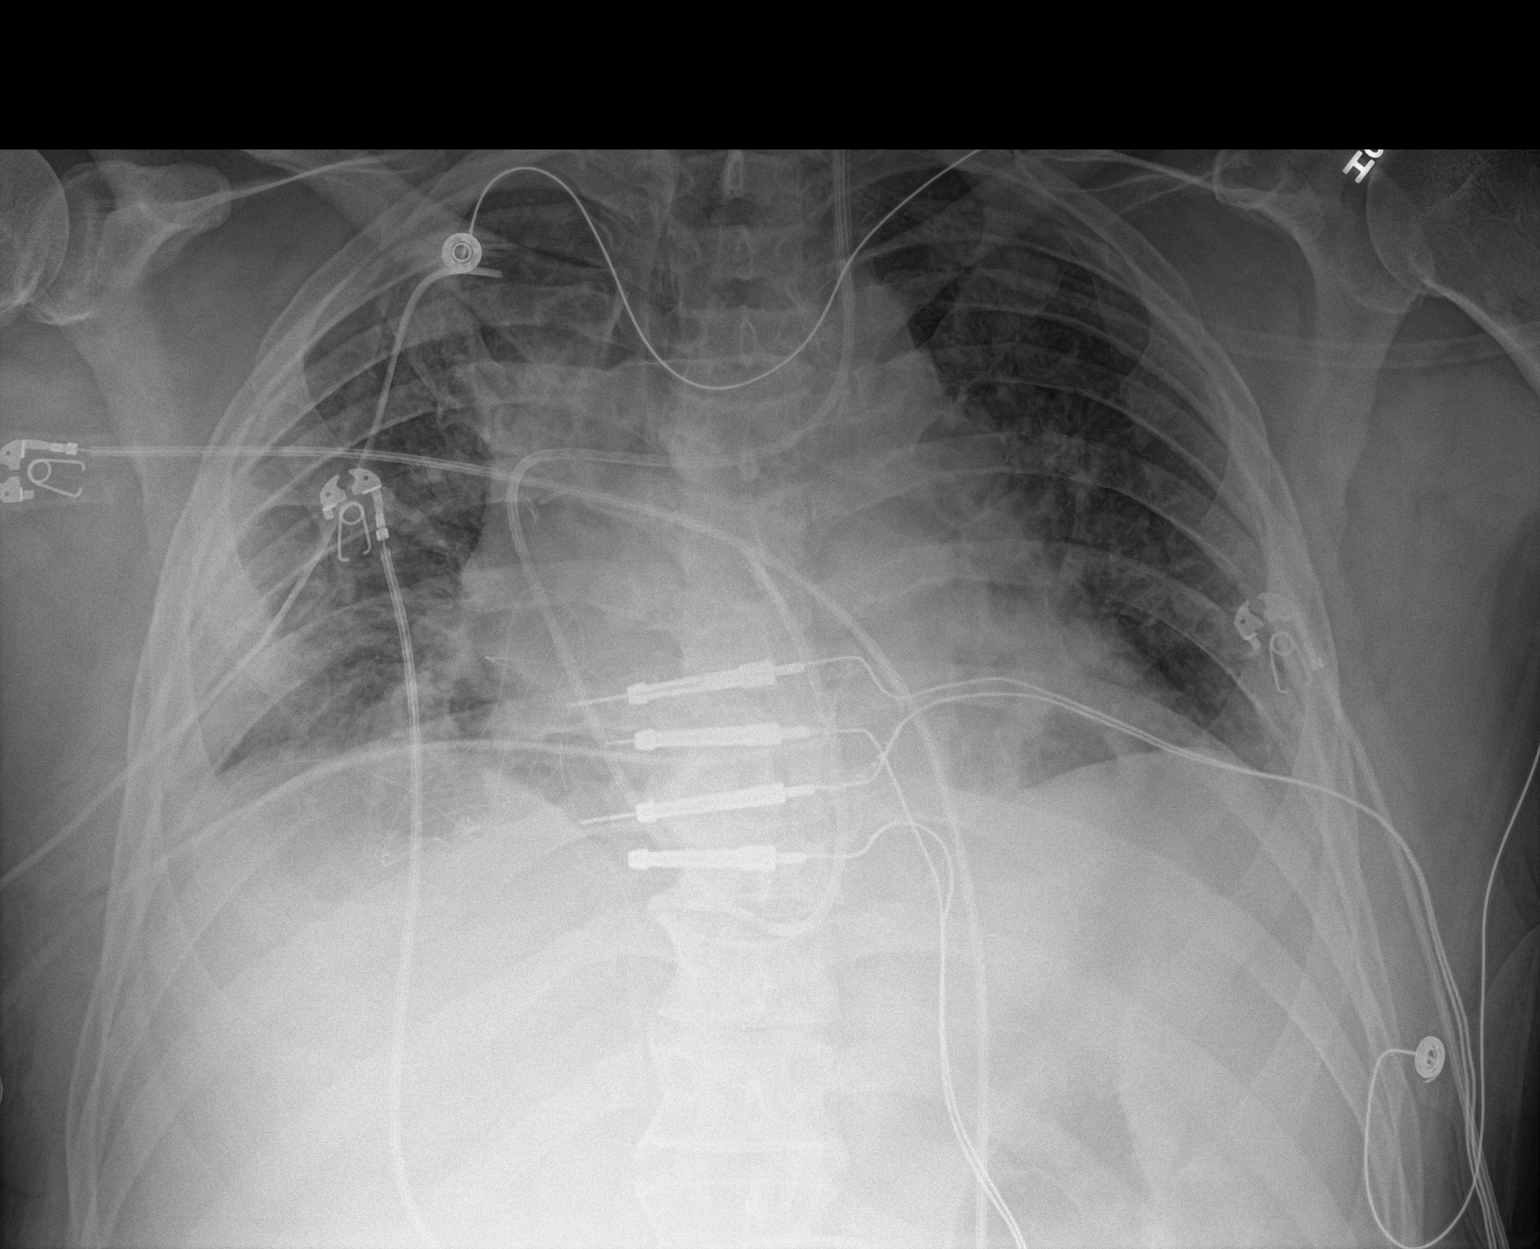

[1 of 1 positions shown; findings below may reference images not displayed]

FINDINGS: Stable postoperative cardiomediastinal contours. Low lung volumes
with bibasilar atelectasis. No pneumothorax or large pleural
effusion. A right-sided chest tube and Swan-Ganz catheter remain in
place.
IMPRESSION: Right-sided chest tube remains in place without evidence of
pneumothorax. Low volumes with bibasilar atelectasis.

## 2022-01-02 IMAGING — DX DG CHEST 2V
2 series · 2 of 2 positions shown · non-contrast
Comparison: 10/22/2019

CLINICAL DATA: Postop check.

EXAM:
CHEST - 2 VIEW

[chest lat]
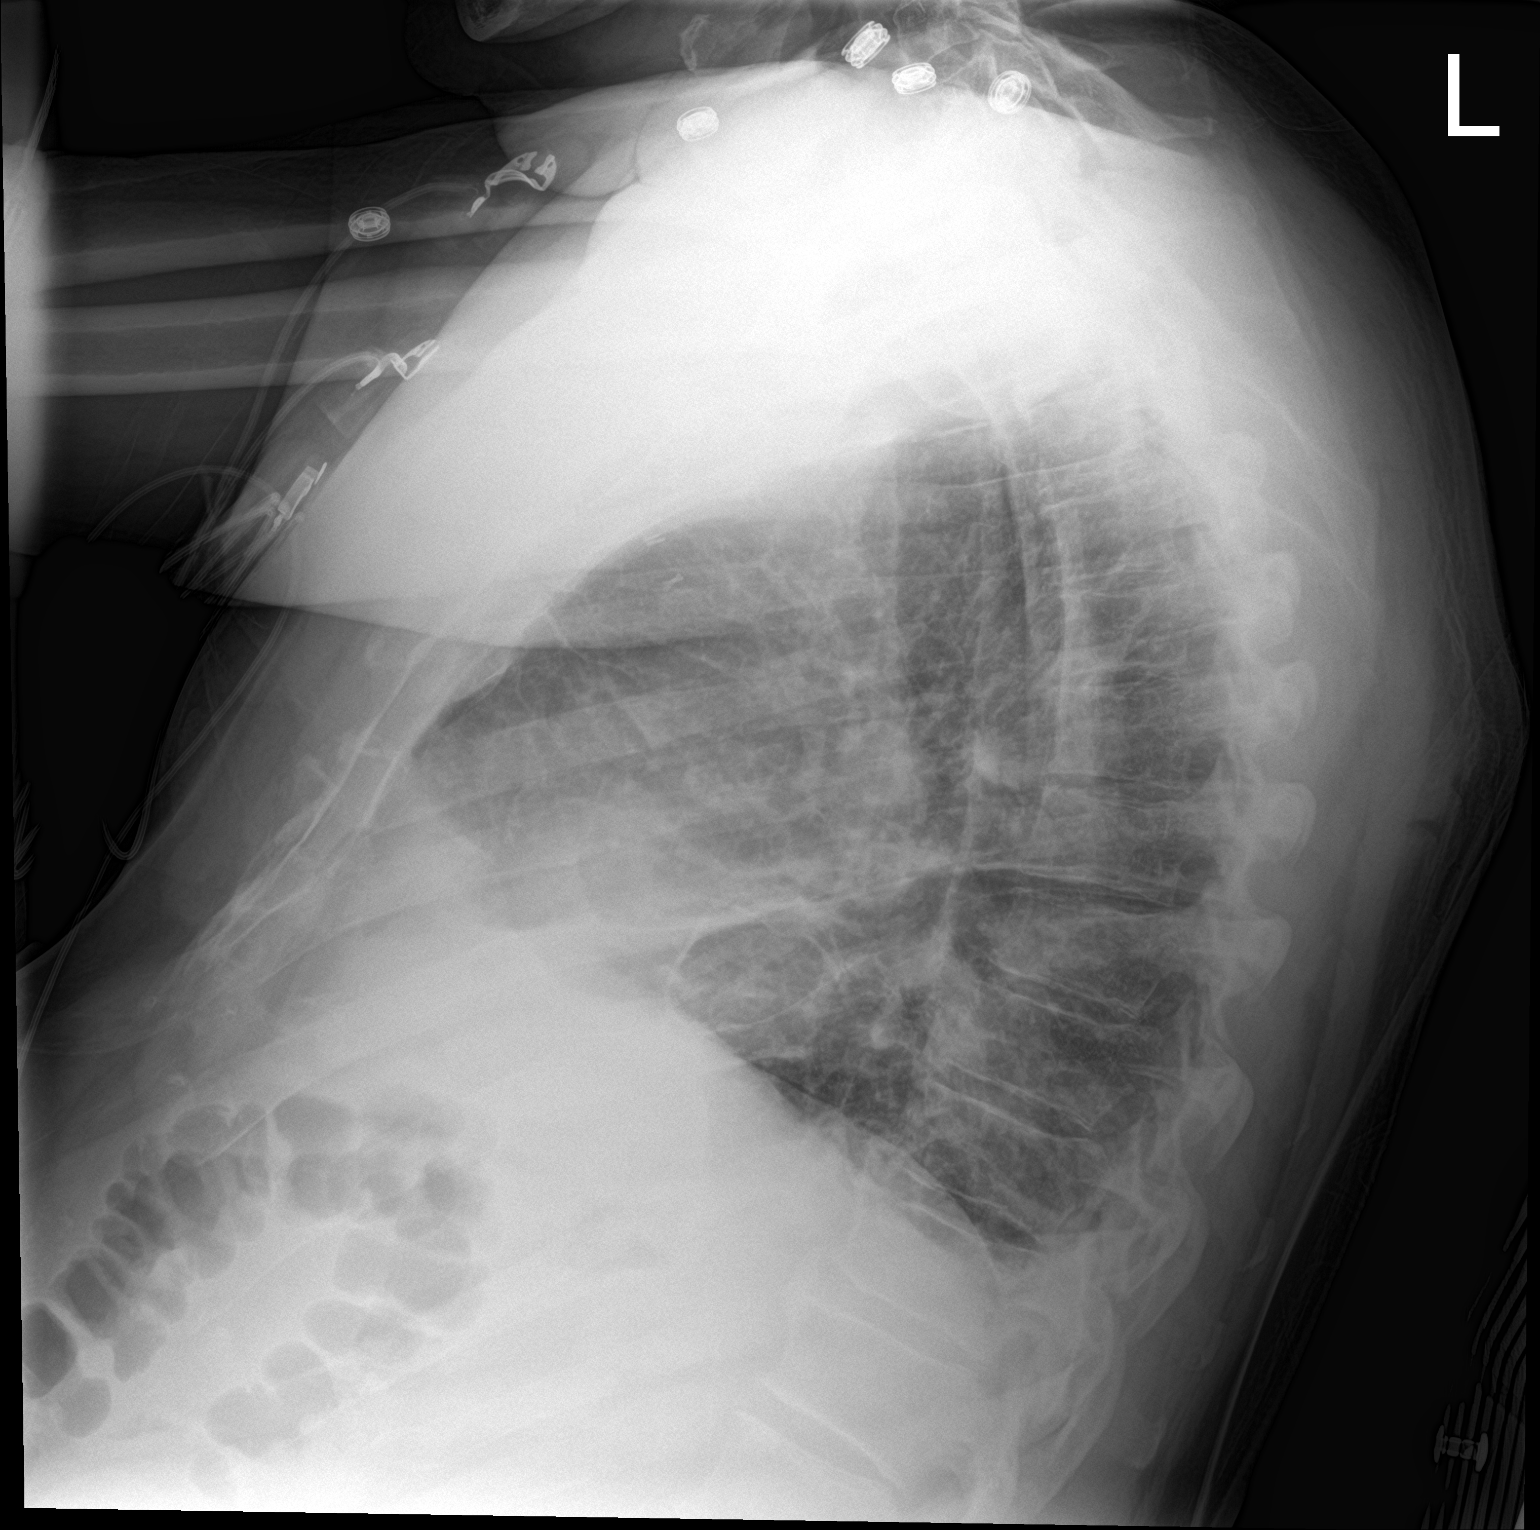

[chest ap]
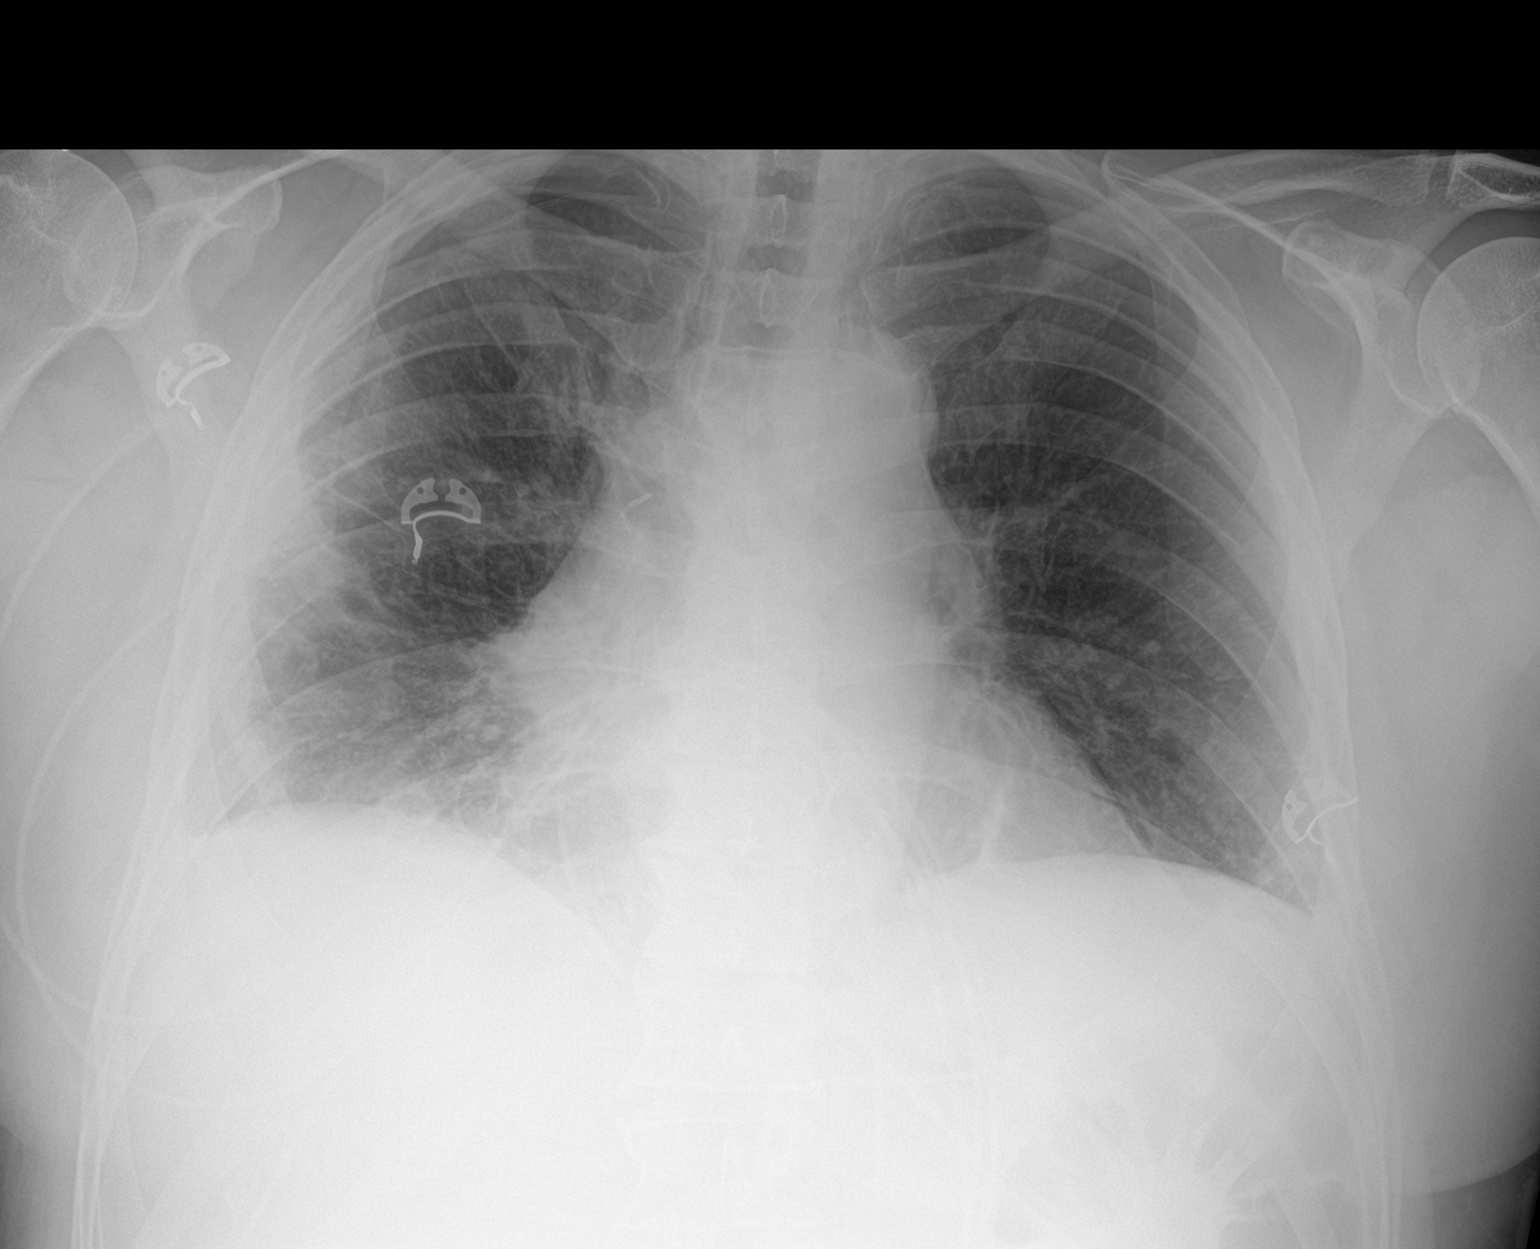

[2 of 2 positions shown; findings below may reference images not displayed]

FINDINGS: Interval removal of right apical chest tube. Lungs are hypoinflated
with persistent opacification over the lateral right mid to lower
lung. Subtle line over the right apex likely artifactual, small
right apical pneumothorax is possible. Subtle blunting of the right
costophrenic angle. Subtle linear density over the left retrocardiac
region likely atelectasis. Cardiomediastinal silhouette is normal.
IMPRESSION: 1. Persistent opacification over the lateral right mid to lower lung
likely atelectasis, although infection is possible. Possible small
amount right pleural fluid.

2. Subtle line over the right apex likely artifactual, although
small apical pneumothorax is possible. Recommend attention on
follow-up.

These results will be called to the ordering clinician or
representative by the Radiologist Assistant, and communication
documented in the PACS or [REDACTED].

## 2022-03-20 ENCOUNTER — Other Ambulatory Visit: Payer: Self-pay | Admitting: Cardiovascular Disease

## 2022-06-18 ENCOUNTER — Encounter: Payer: Self-pay | Admitting: Cardiovascular Disease

## 2022-06-18 ENCOUNTER — Other Ambulatory Visit: Payer: Self-pay

## 2022-06-18 MED ORDER — ROSUVASTATIN CALCIUM 20 MG PO TABS: 20.0000 mg | ORAL_TABLET | Freq: Every day | ORAL | 1 refills | Status: DC

## 2022-09-14 ENCOUNTER — Other Ambulatory Visit: Payer: Self-pay | Admitting: Cardiovascular Disease

## 2023-01-30 ENCOUNTER — Encounter: Payer: Self-pay | Admitting: Cardiovascular Disease

## 2023-03-05 ENCOUNTER — Other Ambulatory Visit: Payer: Self-pay | Admitting: Cardiovascular Disease

## 2023-03-21 ENCOUNTER — Other Ambulatory Visit: Payer: Self-pay | Admitting: Cardiovascular Disease

## 2023-04-16 ENCOUNTER — Encounter: Payer: Self-pay | Admitting: Physical Therapy

## 2023-04-16 ENCOUNTER — Other Ambulatory Visit: Payer: Self-pay

## 2023-04-16 ENCOUNTER — Ambulatory Visit: Payer: Medicare Other | Attending: Internal Medicine | Admitting: Physical Therapy

## 2023-04-16 VITALS — BP 120/78 | HR 91

## 2023-04-16 DIAGNOSIS — R42 Dizziness and giddiness: Secondary | ICD-10-CM | POA: Diagnosis present

## 2023-04-16 NOTE — Therapy (Addendum)
OUTPATIENT PHYSICAL THERAPY VESTIBULAR EVALUATION     Patient Name: Oscar [name] MRN: [id number] DOB:Sep 22, 1955, 67 y.o., male Today's Date: 04/18/2023  END OF SESSION:    04/16/23 0935  PT Visits / Re-Eval  Visit Number 1  Number of Visits 7  Date for PT Re-Evaluation 05/23/23  Authorization  Authorization Type [employer]  PT Time Calculation  PT Start Time [phone]  PT Stop Time 1015  PT Time Calculation (min) 41 min  PT - End of Session  Equipment Utilized During Treatment Gait belt  Activity Tolerance Patient tolerated treatment well  Behavior During Therapy WFL for tasks assessed/performed      Past Medical History:  Diagnosis Date   Allergy    Cataract    CHF (congestive heart failure) (HCC) 09/2019   Chronic kidney disease    GM:[id number]   Complication of anesthesia    Hyperlipidemia    Insomnia    Mitral regurgitation    PONV (postoperative nausea and vomiting)    S/P minimally-invasive mitral valve repair 10/20/2019   Complex valvuloplasty including artificial Gore-tex neochord placement x12, suture plication of posterior leaflet and 32 mm Sorin Memo 4D ring annuloplasty via right mini thoracotomy approach   Past Surgical History:  Procedure Laterality Date   BUBBLE STUDY  09/16/2019   Procedure: BUBBLE STUDY;  Surgeon: Sande Rives, MD;  Location: [facility] ENDOSCOPY;  Service: Cardiovascular;;   CARDIAC CATHETERIZATION Bilateral 09/21/2019   FOOT SURGERY     right   MITRAL VALVE REPAIR Right 10/20/2019   Procedure: MINIMALLY INVASIVE MITRAL VALVE REPAIR (MVR);  Surgeon: Purcell Nails, MD;  Location: [facility] OR;  Service: Open Heart Surgery;  Laterality: Right;   RIGHT/LEFT HEART CATH AND CORONARY ANGIOGRAPHY N/A 09/21/2019   Procedure: RIGHT/LEFT HEART CATH AND CORONARY ANGIOGRAPHY;  Surgeon: Yvonne Kendall, MD;  Location: [facility] INVASIVE CV LAB;  Service: Cardiovascular;  Laterality: N/A;   SHOULDER ARTHROSCOPY     TEE WITHOUT  CARDIOVERSION N/A 09/16/2019   Procedure: TRANSESOPHAGEAL ECHOCARDIOGRAM (TEE);  Surgeon: Sande Rives, MD;  Location: [facility] ENDOSCOPY;  Service: Cardiovascular;  Laterality: N/A;   TEE WITHOUT CARDIOVERSION N/A 10/20/2019   Procedure: TRANSESOPHAGEAL ECHOCARDIOGRAM (TEE);  Surgeon: Purcell Nails, MD;  Location: [facility] OR;  Service: Open Heart Surgery;  Laterality: N/A;   thumb surgery     right   TONSILLECTOMY     VASECTOMY     Patient Active Problem List   Diagnosis Date Noted   Long term (current) use of anticoagulants 10/30/2019   S/P minimally-invasive mitral valve repair 10/20/2019   Mitral valve regurgitation 04/11/2019   Benign prostatic hyperplasia with lower urinary tract symptoms 01/10/2018   History of nephrolithiasis 01/10/2018   Insomnia    Hyperlipidemia 11/10/2008    PCP: Emilio Aspen, MD REFERRING PROVIDER: Emilio Aspen, MD  REFERRING DIAG: H81.11 (ICD-10-CM) - Benign paroxysmal vertigo, right ear  THERAPY DIAG:  Dizziness and giddiness - Plan: PT plan of care cert/re-cert  ONSET DATE: 04/11/2023 (referring diagnosis)   Rationale for Evaluation and Treatment: Rehabilitation  SUBJECTIVE:   SUBJECTIVE STATEMENT: Patient reports he was playing golf a few months ago and got spinning. He was laid backward and found to have nystagmus. Was given postional treatment for home but did not notice any improvement. Was told it was on the L side. Reports no prior history of vertigo. He tried positional treatment at home but did not resolve. Reports dizziness any time he gets up now.   Pt accompanied  by: self  PERTINENT HISTORY: Mitral valve repair in 04/2019,   PAIN:  Are you having pain? No  PRECAUTIONS: None and Fall  RED FLAGS: None   WEIGHT BEARING RESTRICTIONS: No  FALLS: Has patient fallen in last 6 months? No  LIVING ENVIRONMENT: Lives with: lives with their spouse Lives in: House/apartment Stairs: Yes: Internal: 30 stairs to  basement, 1 flight going upstairs  steps; on left going up and External: 2-3 steps; on left going up Has following equipment at home: None  PLOF: Independent - retired working from [employer], regularly travels   PATIENT GOALS: "Understand how I can mitigate this in the future."   OBJECTIVE:  Note: Objective measures were completed at Evaluation unless otherwise noted.  DIAGNOSTIC FINDINGS: no relevant recent imaging  COGNITION: Overall cognitive status: Within functional limits for tasks assessed   SENSATION: WFL  EDEMA:  None other than mild swellinging in ankle when dropped motorcycle on ankle  Cervical ROM:    Grossly WFL on all ROM  PATIENT SURVEYS:  FOTO 58  VESTIBULAR ASSESSMENT:  GENERAL OBSERVATION: ambulates without AD, no glasses   SYMPTOM BEHAVIOR:  Subjective history: see above, started a few months ago  Non-Vestibular symptoms:  none Type of dizziness: Imbalance (Disequilibrium), Spinning/Vertigo, Unsteady with head/body turns, and "World moves"  Frequency: every morning  Duration: ~ 10 seconds  Aggravating factors: getting up in the morning   Relieving factors: no known relieving factors  Progression of symptoms: unchanged  Test of Skew: negative  VBI: negative  OCULOMOTOR EXAM:  Ocular Alignment: normal  Ocular ROM: No Limitations  Spontaneous Nystagmus: absent  Gaze-Induced Nystagmus: age appropriate nystagmus at end range  Smooth Pursuits: intact  Saccades: intact - mild delay in speed vertical but grossly WFL for age matched norms  Convergence/Divergence: < 5 cm - mild exotropia of eye noted    VESTIBULAR - OCULAR REFLEX:   Slow VOR: Normal  VOR Cancellation: Normal  Head-Impulse Test: HIT Right: negative HIT Left: negative  POSITIONAL TESTING:  Right Dix-Hallpike: upbeating, right nystagmus Left Dix-Hallpike: no nystagmus Right Roll Test: no nystagmus Left Roll Test: no nystagmus  1x Epley maneuver for right posterior (addendum for typo  correction) BPPV, rechecked and eye beat more consistent with horizontal canal BPPV though still in R [employer], rechecked horizontal and all clear, checked sidelying and clear on R but geotropic beat on L, held further testing until next session  MOTION SENSITIVITY:  Motion Sensitivity Quotient Intensity: 0 = none, 1 = Lightheaded, 2 = Mild, 3 = Moderate, 4 = Severe, 5 = Vomiting  Intensity  1. Sitting to supine   2. Supine to L side   3. Supine to R side   4. Supine to sitting   5. L Hallpike-Dix   6. Up from L    7. R Hallpike-Dix   8. Up from R    9. Sitting, head tipped to L knee 0  10. Head up from L knee 0  11. Sitting, head tipped to R knee 0  12. Head up from R knee 0  13. Sitting head turns x5 0  14.Sitting head nods x5 0  15. In stance, 180 turn to L    16. In stance, 180 turn to R     VESTIBULAR TREATMENT:  Initial Eval only  PATIENT EDUCATION: Education details: POC, goal collaboration, examination findings Person educated: Patient Education method: Explanation Education comprehension: verbalized understanding  HOME EXERCISE PROGRAM:  To be provided   GOALS: Goals reviewed with patient? Yes  LONG TERM GOALS: Target date: 05/23/2023 (STG = LTG due to POC length)   Patient will report demonstrate independence with final HEP in order to maintain current gains and continue to progress after physical therapy discharge.   Baseline: To be provided as indicated Goal status: INITIAL  2.  Patient will demonstrate clear positional testing to indicate resolution of BPPV.  Baseline: Recheck - indications of R Posterior canal BPPV with possible conversion to horizontal Goal status: INITIAL  3.  Patient will improve FOTO score to 63 to achieve predicted improvements in functional mobility due to skilled physical therapy interventions to increase safety with and  participation in daily activities.  Baseline: 58 Goal status: INITIAL   ASSESSMENT:  CLINICAL IMPRESSION:  Patient is a 67 y.o. male who was seen today for physical therapy evaluation and treatment for suspected BPPV. Patient vestibular and oculomotor testing grossly WNL with exception of abnormal presentation of BPPV as well as mild exotropia with convergence though has known football history.  Patient initally presents with syptoms consistent with R posterior canal canalithiasis with 1x Epley maneuver. However, upon recheck, patient presents with horizontal nystagmus (geotropic versus geotropic unclear) in R [employer] and L sidelying though horizontal testing unclear. Recommend retesting in future session in order to determine if true BPPV in nature and treat as indicated. Patient will benefit from PT to resolve symptoms.   OBJECTIVE IMPAIRMENTS: decreased balance and dizziness.   ACTIVITY LIMITATIONS: bending and bed mobility  PARTICIPATION LIMITATIONS: community activity and yard work  PERSONAL FACTORS: Age and Time since onset of injury/illness/exacerbation are also affecting patient's functional outcome.   REHAB POTENTIAL: Good  CLINICAL DECISION MAKING: Evolving/moderate complexity  EVALUATION COMPLEXITY: Moderate   PLAN:  PT FREQUENCY: 2x/week  PT DURATION: 3 weeks  PLANNED INTERVENTIONS: 97164- PT Re-evaluation, 97110-Therapeutic exercises, 97530- Therapeutic activity, 97112- Neuromuscular re-education, 97535- Self Care, [id number]- Manual therapy, [id number]- Gait training, [phone]- Canalith repositioning, and Vestibular training  PLAN FOR NEXT SESSION: retest positional testing and treat as indicated R posterior canal BPPV versus conversion to horizontal?, recheck all canals   Carmelia Bake, PT 04/18/2023, 9:28 AM

## 2023-04-23 ENCOUNTER — Ambulatory Visit: Payer: Medicare Other | Admitting: Physical Therapy

## 2023-04-23 ENCOUNTER — Encounter: Payer: Self-pay | Admitting: Physical Therapy

## 2023-04-23 VITALS — BP 118/75 | HR 83

## 2023-04-23 DIAGNOSIS — R42 Dizziness and giddiness: Secondary | ICD-10-CM | POA: Diagnosis not present

## 2023-04-23 NOTE — Therapy (Signed)
OUTPATIENT PHYSICAL THERAPY VESTIBULAR TREATMENT / DISCHARGE      Patient Name: Oscar [name] MRN: [id number] DOB:May 31, 1955, 67 y.o., male Today's Date: 04/23/2023  END OF SESSION:    PT End of Session - 04/23/23 0848     Visit Number 2    Number of Visits 7    Date for PT Re-Evaluation 05/23/23    Authorization Type [employer] Medicare    PT Start Time [phone]    PT Stop Time 0913    PT Time Calculation (min) 30 min    Equipment Utilized During Treatment Gait belt    Activity Tolerance Patient tolerated treatment well    Behavior During Therapy WFL for tasks assessed/performed             Past Medical History:  Diagnosis Date   Allergy    Cataract    CHF (congestive heart failure) (HCC) 09/2019   Chronic kidney disease    [id number]   Complication of anesthesia    Hyperlipidemia    Insomnia    Mitral regurgitation    PONV (postoperative nausea and vomiting)    S/P minimally-invasive mitral valve repair 10/20/2019   Complex valvuloplasty including artificial Gore-tex neochord placement x12, suture plication of posterior leaflet and 32 mm Sorin Memo 4D ring annuloplasty via right mini thoracotomy approach   Past Surgical History:  Procedure Laterality Date   BUBBLE STUDY  09/16/2019   Procedure: BUBBLE STUDY;  Surgeon: Sande Rives, MD;  Location: [facility] ENDOSCOPY;  Service: Cardiovascular;;   CARDIAC CATHETERIZATION Bilateral 09/21/2019   FOOT SURGERY     right   MITRAL VALVE REPAIR Right 10/20/2019   Procedure: MINIMALLY INVASIVE MITRAL VALVE REPAIR (MVR);  Surgeon: Purcell Nails, MD;  [facility] OR;  Service: Open Heart Surgery;  Laterality: Right;   RIGHT/LEFT HEART CATH AND CORONARY ANGIOGRAPHY N/A 09/21/2019   Procedure: RIGHT/LEFT HEART CATH AND CORONARY ANGIOGRAPHY;  Surgeon: Yvonne Kendall, MD;  Location: [facility] INVASIVE CV LAB;  Service: Cardiovascular;  Laterality: N/A;   SHOULDER ARTHROSCOPY     TEE WITHOUT CARDIOVERSION N/A 09/16/2019    Procedure: TRANSESOPHAGEAL ECHOCARDIOGRAM (TEE);  Surgeon: Sande Rives, MD;  Location: [facility] ENDOSCOPY;  Service: Cardiovascular;  Laterality: N/A;   TEE WITHOUT CARDIOVERSION N/A 10/20/2019   Procedure: TRANSESOPHAGEAL ECHOCARDIOGRAM (TEE);  Surgeon: Purcell Nails, MD;  Location: [facility] OR;  Service: Open Heart Surgery;  Laterality: N/A;   thumb surgery     right   TONSILLECTOMY     VASECTOMY     Patient Active Problem List   Diagnosis Date Noted   Long term (current) use of anticoagulants 10/30/2019   S/P minimally-invasive mitral valve repair 10/20/2019   Mitral valve regurgitation 04/11/2019   Benign prostatic hyperplasia with lower urinary tract symptoms 01/10/2018   History of nephrolithiasis 01/10/2018   Insomnia    Hyperlipidemia 11/10/2008    PCP: Emilio Aspen, MD REFERRING PROVIDER: Emilio Aspen, MD  REFERRING DIAG: H81.11 (ICD-10-CM) - Benign paroxysmal vertigo, right ear  THERAPY DIAG:  Dizziness and giddiness  ONSET DATE: 04/11/2023 (referring diagnosis)   Rationale for Evaluation and Treatment: Rehabilitation  SUBJECTIVE:   SUBJECTIVE STATEMENT: Patient reports that he was initially quite dizzy after last session but has not had any dizzy spells since then. Denies falls and near falls.   Pt accompanied by: self  PERTINENT HISTORY: Mitral valve repair in 04/2019,   PAIN:  Are you having pain? No  PRECAUTIONS: None and Fall  RED FLAGS: None  WEIGHT BEARING RESTRICTIONS: No  FALLS: Has patient fallen in last 6 months? No  LIVING ENVIRONMENT: Lives with: lives with their spouse Lives in: House/apartment Stairs: Yes: Internal: 30 stairs to basement, 1 flight going upstairs  steps; on left going up and External: 2-3 steps; on left going up Has following equipment at home: None  PLOF: Independent - retired working from [employer], regularly travels   PATIENT GOALS: "Understand how I can mitigate this in the future."   OBJECTIVE:   Note: Objective measures were completed at Evaluation unless otherwise noted.  DIAGNOSTIC FINDINGS: no relevant recent imaging   VESTIBULAR TREATMENT:                                                                                                     Vitals:   04/23/23 0852  BP: 118/75  Pulse: 83  Seated on LUE  NMR:  Rechecked positional testing from prior session; R posterior, L posterior, and bilateral horizontal canals all clear at this time consistent with patient's subjective   TheAct:  FOTO: Improved to 67  Discussed anatomy of inner ear, basis for positional maneuvers, and cause of BPPV. Discussed how to address in future as indicated.   PATIENT EDUCATION: Education details: See above + Discharge  Person educated: Patient Education method: Explanation Education comprehension: verbalized understanding  HOME EXERCISE PROGRAM:  To be provided   GOALS: Goals reviewed with patient? Yes  LONG TERM GOALS: Target date: 05/23/2023 (STG = LTG due to POC length)   Patient will report demonstrate independence with final HEP in order to maintain current gains and continue to progress after physical therapy discharge.   Baseline: To be provided as indicated Goal status: DISCONTINUED - not indicated  2.  Patient will demonstrate clear positional testing to indicate resolution of BPPV.  Baseline: Recheck - indications of R Posterior canal BPPV with possible conversion to horizontal, all clear positional testing in today's session Goal status: MET  3.  Patient will improve FOTO score to 63 to achieve predicted improvements in functional mobility due to skilled physical therapy interventions to increase safety with and participation in daily activities.  Baseline: 58; improved to 67 Goal status: MET   ASSESSMENT:  CLINICAL IMPRESSION:  Patient is being discharged from PT due to clear positional testing in today's session consistent with findings as home. Patient agreeable  to discharge and demonstrates understanding of when to return to PT if needed. No further PT services indicated at this time and all LTGs met.   OBJECTIVE IMPAIRMENTS: decreased balance and dizziness.   ACTIVITY LIMITATIONS: bending and bed mobility  PARTICIPATION LIMITATIONS: community activity and yard work  PERSONAL FACTORS: Age and Time since onset of injury/illness/exacerbation are also affecting patient's functional outcome.   REHAB POTENTIAL: Good  CLINICAL DECISION MAKING: Evolving/moderate complexity  EVALUATION COMPLEXITY: Moderate   PLAN:  PT FREQUENCY: 2x/week  PT DURATION: 3 weeks  PLANNED INTERVENTIONS: 97164- PT Re-evaluation, 97110-Therapeutic exercises, 97530- Therapeutic activity, 97112- Neuromuscular re-education, 97535- Self Care, 16109- Manual therapy, [id number]- Gait training, [phone]- Canalith repositioning, and Vestibular training  PLAN FOR NEXT  SESSION: NA - all goals met  Carmelia Bake, PT 04/23/2023, 9:33 AM

## 2023-04-25 ENCOUNTER — Ambulatory Visit: Payer: Medicare Other | Admitting: Physical Therapy

## 2023-04-30 ENCOUNTER — Ambulatory Visit: Payer: Medicare Other | Admitting: Physical Therapy

## 2023-05-03 ENCOUNTER — Ambulatory Visit: Payer: Medicare Other | Admitting: Physical Therapy

## 2023-05-07 ENCOUNTER — Ambulatory Visit: Payer: Medicare Other | Admitting: Physical Therapy

## 2023-05-09 ENCOUNTER — Ambulatory Visit: Payer: Medicare Other | Admitting: Physical Therapy

## 2023-06-18 ENCOUNTER — Other Ambulatory Visit: Payer: Self-pay | Admitting: Cardiovascular Disease

## 2023-06-29 ENCOUNTER — Other Ambulatory Visit: Payer: Self-pay | Admitting: Cardiovascular Disease

## 2023-07-18 ENCOUNTER — Other Ambulatory Visit: Payer: Self-pay | Admitting: Cardiovascular Disease

## 2023-07-18 MED ORDER — ROSUVASTATIN CALCIUM 20 MG PO TABS: 20.0000 mg | ORAL_TABLET | Freq: Every day | ORAL | 0 refills | Status: DC

## 2023-07-18 NOTE — Telephone Encounter (Signed)
 Patient identification verified by 2 forms. Marilynn Rail, RN    Called and spoke to patient  Informed patient:   -Partial prescription sent last time due to LOV being in 2023   -he will need new OV for additional refill  Patient scheduled for 4/8 at 9:15am with NP [facility]  Patient aware 30 day supply sent with no refills  Patient verbalized understanding, no questions at this time

## 2023-07-19 ENCOUNTER — Other Ambulatory Visit (HOSPITAL_COMMUNITY): Payer: Self-pay | Admitting: Sports Medicine

## 2023-07-19 DIAGNOSIS — M25571 Pain in right ankle and joints of right foot: Secondary | ICD-10-CM

## 2023-07-24 ENCOUNTER — Ambulatory Visit
Admission: RE | Admit: 2023-07-24 | Discharge: 2023-07-24 | Disposition: A | Discharge status: Home / Self Care | Source: Ambulatory Visit | Attending: Sports Medicine | Admitting: Sports Medicine

## 2023-07-24 DIAGNOSIS — M25571 Pain in right ankle and joints of right foot: Secondary | ICD-10-CM | POA: Diagnosis present

## 2023-07-25 ENCOUNTER — Other Ambulatory Visit

## 2023-07-26 ENCOUNTER — Other Ambulatory Visit: Payer: Self-pay | Admitting: Cardiovascular Disease

## 2023-08-12 ENCOUNTER — Ambulatory Visit: Admitting: Cardiology

## 2023-08-13 ENCOUNTER — Ambulatory Visit: Admitting: Cardiology

## 2023-08-14 ENCOUNTER — Ambulatory Visit: Attending: Nurse Practitioner | Admitting: Nurse Practitioner

## 2023-08-14 ENCOUNTER — Encounter: Payer: Self-pay | Admitting: Nurse Practitioner

## 2023-08-14 VITALS — BP 124/82 | HR 85 | Ht 74.0 in | Wt 261.8 lb

## 2023-08-14 DIAGNOSIS — Z2989 Encounter for other specified prophylactic measures: Secondary | ICD-10-CM

## 2023-08-14 DIAGNOSIS — I340 Nonrheumatic mitral (valve) insufficiency: Secondary | ICD-10-CM | POA: Diagnosis not present

## 2023-08-14 DIAGNOSIS — Z9889 Other specified postprocedural states: Secondary | ICD-10-CM | POA: Diagnosis not present

## 2023-08-14 DIAGNOSIS — I2510 Atherosclerotic heart disease of native coronary artery without angina pectoris: Secondary | ICD-10-CM | POA: Diagnosis not present

## 2023-08-14 DIAGNOSIS — E782 Mixed hyperlipidemia: Secondary | ICD-10-CM | POA: Diagnosis not present

## 2023-08-14 MED ORDER — ROSUVASTATIN CALCIUM 20 MG PO TABS: 20.0000 mg | ORAL_TABLET | Freq: Every day | ORAL | 3 refills | Status: AC

## 2023-08-14 NOTE — Progress Notes (Signed)
 Office Visit    Patient Name: Oscar [name] Date of Encounter: 08/14/2023  Primary Care Provider:  Emilio Aspen, MD Primary Cardiologist:  Reatha Harps, MD  Chief Complaint    68 year old with a history of nonobstructive CAD, mitral valve regurgitation s/p minimally invasive mitral valve repair in 2021, and hyperlipidemia who presents for follow-up related to CAD and mitral valve regurgitation.  Past Medical History    Past Medical History:  Diagnosis Date   Allergy    Cataract    CHF (congestive heart failure) (HCC) 09/2019   Chronic kidney disease    NW:[id number]   Complication of anesthesia    Hyperlipidemia    Insomnia    Mitral regurgitation    PONV (postoperative nausea and vomiting)    S/P minimally-invasive mitral valve repair 10/20/2019   Complex valvuloplasty including artificial Gore-tex neochord placement x12, suture plication of posterior leaflet and 32 mm Sorin Memo 4D ring annuloplasty via right mini thoracotomy approach   Past Surgical History:  Procedure Laterality Date   BUBBLE STUDY  09/16/2019   Procedure: BUBBLE STUDY;  Surgeon: Sande Rives, MD;  Location: [facility] ENDOSCOPY;  Service: Cardiovascular;;   CARDIAC CATHETERIZATION Bilateral 09/21/2019   FOOT SURGERY     right   MITRAL VALVE REPAIR Right 10/20/2019   Procedure: MINIMALLY INVASIVE MITRAL VALVE REPAIR (MVR);  Surgeon: Purcell Nails, MD;  Location: [facility] OR;  Service: Open Heart Surgery;  Laterality: Right;   RIGHT/LEFT HEART CATH AND CORONARY ANGIOGRAPHY N/A 09/21/2019   Procedure: RIGHT/LEFT HEART CATH AND CORONARY ANGIOGRAPHY;  Surgeon: Yvonne Kendall, MD;  Location: [facility] INVASIVE CV LAB;  Service: Cardiovascular;  Laterality: N/A;   SHOULDER ARTHROSCOPY     TEE WITHOUT CARDIOVERSION N/A 09/16/2019   Procedure: TRANSESOPHAGEAL ECHOCARDIOGRAM (TEE);  Surgeon: Sande Rives, MD;  Location: [facility] ENDOSCOPY;  Service: Cardiovascular;  Laterality: N/A;   TEE WITHOUT  CARDIOVERSION N/A 10/20/2019   Procedure: TRANSESOPHAGEAL ECHOCARDIOGRAM (TEE);  Surgeon: Purcell Nails, MD;  Location: [facility] OR;  Service: Open Heart Surgery;  Laterality: N/A;   thumb surgery     right   TONSILLECTOMY     VASECTOMY      Allergies  Allergies  Allergen Reactions   Poison Ivy Treatments [Poison Ivy Treatments]     Allergic to poison ivy   Pollen Extract      Labs/Other Studies Reviewed    The following studies were reviewed today:  Cardiac Studies & Procedures   ______________________________________________________________________________________________ CARDIAC CATHETERIZATION  CARDIAC CATHETERIZATION 09/21/2019  Narrative Conclusions: 1. Mild ostial D1 narrowing (~20%).  Otherwise, no angiographically significant coronary artery disease. 2. Upper normal to mildly elevated left and right heart filling pressures. 3. Low normal Fick cardiac output/index.  Recommendations: 1. Ongoing management of mitral regurgitation per Drs. O'Neal and Cornelius Moras. 2. Primary prevention of coronary artery disease.  Yvonne Kendall, MD [facility]  Findings Coronary Findings Diagnostic  Dominance: Right  Left Main Vessel is large. Vessel is angiographically normal.  Left Anterior Descending  First Diagonal Branch Vessel is moderate in size. 1st Diag lesion is 20% stenosed.  Second Diagonal Branch Vessel is small in size.  Left Circumflex Vessel is large. Vessel is angiographically normal.  First Obtuse Marginal Branch Vessel is small in size.  Second Obtuse Marginal Branch Vessel is large in size.  Third Obtuse Marginal Branch Vessel is small in size.  Right Coronary Artery Vessel is large. Vessel is angiographically normal.  Right Posterior Descending Artery Vessel is small in  size.  Right Posterior Atrioventricular Artery Vessel is moderate in size.  Intervention  No interventions have been documented.     ECHOCARDIOGRAM  ECHOCARDIOGRAM  COMPLETE 12/02/2019  Narrative ECHOCARDIOGRAM REPORT    Patient Name:   Oscar [name] Date of Exam: 12/02/2019 Medical Rec #:  [id number]         Height:       74.0 in Accession #:    [id number]        Weight:       242.0 lb Date of Birth:  16-Jul-1955          BSA:          2.357 m Patient Age:    64 years          BP:           119/80 mmHg Patient Gender: M                 HR:           98 bpm. Exam Location:  [address]  Procedure: 2D Echo, 3D Echo, Cardiac Doppler and Color Doppler  Indications:    I34.0 Mitral Valve Insufficiency  History:        Patient has prior history of Echocardiogram examinations, most recent 10/20/2019. Mitral Valve Disease; Risk Factors:Family History of Coronary Artery Disease and Dyslipidemia. Mitral Valve Repair (10-20-19, 32mm Sorin Memo 4D Ring).  Sonographer:    Farrel Conners RDCS Referring Phys: [id number] CADENCE H FURTH  IMPRESSIONS   1. Diffuse hypokinesis with abnormal septal motion do not think 3D quantitative EF is accurate Preop EF estimated at 50-55% appears worse post MV repair . Left ventricular ejection fraction, by estimation, is 45 to 50%. The left ventricle has mildly decreased function. The left ventricle has no regional wall motion abnormalities. Left ventricular diastolic parameters are indeterminate. 2. Right ventricular systolic function is normal. The right ventricular size is normal. There is normal pulmonary artery systolic pressure. 3. Left atrial size was mildly dilated. 4. Post complex MV repair 10/20/19 using 32 mm Sorin Memo 4D ring and multiple neo chords prominent to PM papillary muscle no significant residual MR Mean gradient in diastole 4 mmHg peak 10 mmHg no MS by PT 1/2 . The mitral valve has been repaired/replaced. No evidence of mitral valve regurgitation. No evidence of mitral stenosis. 5. The aortic valve is normal in structure. Aortic valve regurgitation is not visualized. No aortic stenosis is present. 6.  The inferior vena cava is normal in size with greater than 50% respiratory variability, suggesting right atrial pressure of 3 mmHg.  FINDINGS Left Ventricle: Diffuse hypokinesis with abnormal septal motion do not think 3D quantitative EF is accurate Preop EF estimated at 50-55% appears worse post MV repair. Left ventricular ejection fraction, by estimation, is 45 to 50%. The left ventricle has mildly decreased function. The left ventricle has no regional wall motion abnormalities. The left ventricular internal cavity size was normal in size. There is no left ventricular hypertrophy. Left ventricular diastolic parameters are indeterminate.  Right Ventricle: The right ventricular size is normal. No increase in right ventricular wall thickness. Right ventricular systolic function is normal. There is normal pulmonary artery systolic pressure. The tricuspid regurgitant velocity is 2.41 m/s, and with an assumed right atrial pressure of 3 mmHg, the estimated right ventricular systolic pressure is 26.2 mmHg.  Left Atrium: Left atrial size was mildly dilated.  Right Atrium: Right atrial size was normal in size.  Pericardium: There is  no evidence of pericardial effusion.  Mitral Valve: Post complex MV repair 10/20/19 using 32 mm Sorin Memo 4D ring and multiple neo chords prominent to PM papillary muscle no significant residual MR Mean gradient in diastole 4 mmHg peak 10 mmHg no MS by PT 1/2. The mitral valve has been repaired/replaced. Normal mobility of the mitral valve leaflets. No evidence of mitral valve regurgitation. No evidence of mitral valve stenosis. MV peak gradient, 8.5 mmHg. The mean mitral valve gradient is 3.5 mmHg.  Tricuspid Valve: The tricuspid valve is normal in structure. Tricuspid valve regurgitation is mild . No evidence of tricuspid stenosis.  Aortic Valve: The aortic valve is normal in structure. Aortic valve regurgitation is not visualized. No aortic stenosis is present.  Pulmonic  Valve: The pulmonic valve was normal in structure. Pulmonic valve regurgitation is trivial. No evidence of pulmonic stenosis.  Aorta: The aortic root is normal in size and structure.  Venous: The inferior vena cava is normal in size with greater than 50% respiratory variability, suggesting right atrial pressure of 3 mmHg.  IAS/Shunts: No atrial level shunt detected by color flow Doppler.   LEFT VENTRICLE PLAX 2D LVIDd:         4.70 cm  Diastology LVIDs:         3.60 cm  LV e' lateral:   13.20 cm/s LV PW:         1.10 cm  LV E/e' lateral: 8.9 LV IVS:        0.87 cm  LV e' medial:    5.66 cm/s LVOT diam:     2.60 cm  LV E/e' medial:  20.8 LV SV:         128 LV SV Index:   55 LVOT Area:     5.31 cm  3D Volume EF: 3D EF:        66 % LV EDV:       251 ml LV ESV:       86 ml LV SV:        165 ml  RIGHT VENTRICLE RV S prime:     6.34 cm/s TAPSE (M-mode): 1.2 cm  LEFT ATRIUM             Index       RIGHT ATRIUM           Index LA diam:        4.20 cm 1.78 cm/m  RA Area:     10.60 cm LA Vol (A2C):   55.1 ml 23.38 ml/m RA Volume:   24.10 ml  10.23 ml/m LA Vol (A4C):   57.5 ml 24.40 ml/m LA Biplane Vol: 58.7 ml 24.91 ml/m AORTIC VALVE LVOT Vmax:   89.40 cm/s LVOT Vmean:  62.100 cm/s LVOT VTI:    0.242 m  AORTA Ao Root diam: 3.30 cm Ao Asc diam:  3.10 cm  MITRAL VALVE                TRICUSPID VALVE MV Area (PHT): 5.64 cm     TR Peak grad:   23.2 mmHg MV Peak grad:  8.5 mmHg     TR Vmax:        241.00 cm/s MV Mean grad:  3.5 mmHg MV Vmax:       1.46 m/s     SHUNTS MV Vmean:      83.4 cm/s    Systemic VTI:  0.24 m MV Decel Time: 97 msec      Systemic Diam:  2.60 cm MV E velocity: 118.00 cm/s MV A velocity: 142.00 cm/s MV E/A ratio:  0.83  Charlton Haws MD Electronically signed by Charlton Haws MD Signature Date/Time: 12/03/2019/1:51:24 PM    Final   TEE  ECHO INTRAOPERATIVE TEE 10/20/2019  Narrative *INTRAOPERATIVE TRANSESOPHAGEAL REPORT *    Patient Name:    MUJTABA BOLLIG Date of Exam: 10/20/2019 Medical Rec #:  [id number]         Height:       74.0 in Accession #:    [id number]        Weight:       244.0 lb Date of Birth:  10-Dec-1955          BSA:          2.37 m Patient Age:    64 years          BP:           173/88 mmHg Patient Gender: M                 HR:           76 bpm. Exam Location:  Inpatient  Transesophogeal exam was perform intraoperatively during surgical procedure. Patient was closely monitored under general anesthesia during the entirety of examination.  Indications:     I34.1 Nonrheumatic mitral (valve) prolapse Performing Phys: [id number] CLARENCE H OWEN Diagnosing Phys: Gaynelle Adu MD  Complications: No known complications during this procedure. POST-OP IMPRESSIONS - Left Ventricle: The left ventricle is unchanged from pre-bypass. - Aorta: The aorta appears unchanged from pre-bypass. - Aortic Valve: The aortic valve appears unchanged from pre-bypass. - Mitral Valve: A ring has been placed on the Mitral valve. There is no Mitral regurgitation seen with color doppler. The mean gradient across the valve is and a max gradient of . - Tricuspid Valve: The tricuspid valve appears unchanged from pre-bypass.  PRE-OP FINDINGS Left Ventricle: The left ventricle has low normal systolic function, with an ejection fraction of 50-55%. The cavity size was moderately dilated. There is no increase in left ventricular wall thickness.  Right Ventricle: The right ventricle has mildly reduced systolic function. The cavity was mildly enlarged. There is no increase in right ventricular wall thickness.  Left Atrium: Left atrial size was dilated. The left atrial appendage is well visualized and there is no evidence of thrombus present.  Right Atrium:  Interatrial Septum: No atrial level shunt detected by color flow Doppler.  Pericardium: There is no evidence of pericardial effusion.  Mitral Valve: The mitral valve is normal  in structure. No thickening of the mitral valve leaflet. No calcification of the mitral valve leaflet. The valve has a flail segment at P2 and prolapse at P3. Mitral valve regurgitation is severe by color flow Doppler. The MR jet is anteriorly-directed. There is No evidence of mitral stenosis.  Tricuspid Valve: The tricuspid valve was normal in structure. Tricuspid valve regurgitation was not visualized by color flow Doppler.  Aortic Valve: The aortic valve is normal in structure. There is mild calcification of the aortic valve Aortic valve regurgitation was not visualized by color flow Doppler. There is no evidence of aortic valve stenosis.  Pulmonic Valve: The pulmonic valve was normal in structure. Pulmonic valve regurgitation is not visualized by color flow Doppler.   +--------------+--------++ LEFT VENTRICLE         +--------------+--------++ PLAX 2D                +--------------+--------++ LVIDd:  6.00 cm  +--------------+--------++ LVIDs:        4.40 cm  +--------------+--------++ LVOT diam:    2.50 cm  +--------------+--------++ LV SV:        92 ml    +--------------+--------++ LV SV Index:  37.98    +--------------+--------++ LVOT Area:    4.91 cm +--------------+--------++                        +--------------+--------++  +-------------+---------++ MITRAL VALVE                +--------------+-------+ +-------------+---------++      SHUNTS                MV Peak grad:8.4 mmHg       +--------------+-------+ +-------------+---------++      Systemic Diam:2.50 cm MV Mean grad:3.0 mmHg       +--------------+-------+ +-------------+---------++ MV Vmax:     1.45 m/s  +-------------+---------++ MV Vmean:    81.0 cm/s +-------------+---------++ MV VTI:      0.29 m    +-------------+---------++ +----------------+-----------++ MR Peak grad:   71.6 mmHg   +----------------+-----------++ MR Mean  grad:   38.0 mmHg   +----------------+-----------++ MR Vmax:        423.00 cm/s +----------------+-----------++ MR Vmean:       270.0 cm/s  +----------------+-----------++ MR PISA:        6.28 cm    +----------------+-----------++ MR PISA Eff ROA:55 mm      +----------------+-----------++ MR PISA Radius: 1.00 cm     +----------------+-----------++   Gaynelle Adu MD Electronically signed by Gaynelle Adu MD Signature Date/Time: 10/20/2019/3:45:49 PM    Final        ______________________________________________________________________________________________     Recent Labs: No results found for requested labs within last 365 days.  Recent Lipid Panel    Component Value Date/Time   CHOL 156 09/05/2020 1436   TRIG 190.0 (H) 09/05/2020 1436   HDL 49.40 09/05/2020 1436   CHOLHDL 3 09/05/2020 1436   VLDL 38.0 09/05/2020 1436   LDLCALC 69 09/05/2020 1436    History of Present Illness    68 year old with the above past medical history including nonobstructive CAD, mitral valve regurgitation s/p minimally invasive mitral valve repair in 2021, and hyperlipidemia.  He has a history of severe mitral valve regurgitation with P2 prolapse, s/p suture plication of PMVL, 33 mm Sorin annuloplasty in 10/2019. Cardiac catheterization prior to surgery revealed nonobstructive CAD.  Postop echocardiogram showed EF 45 to 50%, mildly decreased LV function, no RWMA, normal LV systolic function, stable mitral valve repair, no evidence of mitral valve regurgitation.  He was last seen in office on 12/12/2021 and was doing well from a cardiac standpoint.   He presents today for follow-up.  Since his last visit he has done well from a cardiac standpoint.  He denies symptoms concerning for angina.  He denies palpitations, dizziness, dyspnea, edema, PND, orthopnea, weight gain.  He is very active.  He just returned from the [place] on a scuba diving trip, he enjoys  riding his dirt bike.  Overall, he reports feeling well.  Home Medications    Current Outpatient Medications  Medication Sig Dispense Refill   aspirin EC 81 MG tablet Take 1 tablet (81 mg total) by mouth daily. Swallow whole. 90 tablet 3   doxazosin (CARDURA) 4 MG tablet Take 4 mg by mouth daily.     Multiple Vitamin (MULTIVITAMIN) tablet Take 1 tablet by mouth daily.  ondansetron (ZOFRAN) 4 MG tablet Take 1 tablet (4 mg total) by mouth every 8 (eight) hours as needed for nausea or vomiting. 20 tablet 0   amoxicillin (AMOXIL) 500 MG tablet Take 4 tablets (2,000 mg total) by mouth as directed for 1 dose. Take 30-60 minutes prior to dental work/procedures. (Patient not taking: Reported on 08/14/2023) 4 tablet 5   rosuvastatin (CRESTOR) 20 MG tablet Take 1 tablet (20 mg total) by mouth daily. 90 tablet 3   No current facility-administered medications for this visit.     Review of Systems    He denies chest pain, palpitations, dyspnea, pnd, orthopnea, n, v, dizziness, syncope, edema, weight gain, or early satiety. All other systems reviewed and are otherwise negative except as noted above.  Physical Exam    VS:  BP 124/82 (BP Location: Left Arm, Patient Position: Sitting)   Pulse 85   Ht 6\' 2"  (1.88 m)   Wt 261 lb 12.8 oz (118.8 kg)   SpO2 97%   BMI 33.61 kg/m  GEN: Well nourished, well developed, in no acute distress. HEENT: normal. Neck: Supple, no JVD, carotid bruits, or masses. Cardiac: RRR, no murmurs, rubs, or gallops. No clubbing, cyanosis, edema.  Radials/DP/PT 2+ and equal bilaterally.  Respiratory:  Respirations regular and unlabored, clear to auscultation bilaterally. GI: Soft, nontender, nondistended, BS + x 4. MS: no deformity or atrophy. Skin: warm and dry, no rash. Neuro:  Strength and sensation are intact. Psych: Normal affect.  Accessory Clinical Findings    ECG personally reviewed by me today - EKG Interpretation Date/Time:  Wednesday August 14 2023 08:28:26  EDT Ventricular Rate:  80 PR Interval:  142 QRS Duration:  110 QT Interval:  378 QTC Calculation: 435 R Axis:   -34  Text Interpretation: Sinus rhythm with occasional Premature ventricular complexes and Premature atrial complexes Left axis deviation Incomplete left bundle branch block Confirmed by Bernadene Person ([id number]) on 08/14/2023 8:36:05 AM  - no acute changes.   Lab Results  Component Value Date   WBC 7.6 09/05/2020   HGB 16.1 09/05/2020   HCT 47.2 09/05/2020   MCV 99.0 09/05/2020   PLT 208.0 09/05/2020   Lab Results  Component Value Date   CREATININE 1.00 09/05/2020   BUN 21 09/05/2020   NA 140 09/05/2020   K 4.4 09/05/2020   CL 104 09/05/2020   CO2 26 09/05/2020   Lab Results  Component Value Date   ALT 31 09/05/2020   AST 22 09/05/2020   ALKPHOS 77 09/05/2020   BILITOT 0.5 09/05/2020   Lab Results  Component Value Date   CHOL 156 09/05/2020   HDL 49.40 09/05/2020   LDLCALC 69 09/05/2020   TRIG 190.0 (H) 09/05/2020   CHOLHDL 3 09/05/2020    Lab Results  Component Value Date   HGBA1C 5.8 09/05/2020    Assessment & Plan    1. CAD: Cardiac catheterization in 2021 revealed nonobstructive CAD.  Stable with no anginal symptoms.  No indication for ischemic evaluation.  Continue aspirin, Crestor.  2. Mitral valve regurgitation: S/p suture plication of PMVL, 33 mm Sorin annuloplasty in 10/2019.  Postop echo in 11/2019 showed EF 45 to 50%, mildly decreased LV function, no RWMA, normal LV systolic function, stable mitral valve repair, no evidence of mitral valve regurgitation. Euvolemic and well compensated on exam. Will repeat echocardiogram for routine monitoring.  Continue SBE prophylaxis.  3. Hyperlipidemia: LDL was 65 in 04/2023.  Continue Crestor, refill sent today.  4. Disposition: Follow-up in  1 year, sooner if needed.      Joylene Grapes, NP 08/14/2023, 9:04 AM

## 2023-08-14 NOTE — Patient Instructions (Signed)
 Medication Instructions:  Your physician recommends that you continue on your current medications as directed. Please refer to the Current Medication list given to you today.  *If you need a refill on your cardiac medications before your next appointment, please call your pharmacy*  Lab Work: NONE ordered at this time of appointment    Testing/Procedures: Your physician has requested that you have an echocardiogram. Echocardiography is a painless test that uses sound waves to create images of your heart. It provides your doctor with information about the size and shape of your heart and how well your heart's chambers and valves are working. This procedure takes approximately one hour. There are no restrictions for this procedure. Please do NOT wear cologne, perfume, aftershave, or lotions (deodorant is allowed). Please arrive 15 minutes prior to your appointment time.  Please note: We ask at that you not bring children with you during ultrasound (echo/ vascular) testing. Due to room size and safety concerns, children are not allowed in the ultrasound rooms during exams. Our front office staff cannot provide observation of children in our lobby area while testing is being conducted. An adult accompanying a patient to their appointment will only be allowed in the ultrasound room at the discretion of the ultrasound technician under special circumstances. We apologize for any inconvenience.   Follow-Up: At [facility], you and your health needs are our priority.  As part of our continuing mission to provide you with exceptional heart care, our providers are all part of one team.  This team includes your primary Cardiologist (physician) and Advanced Practice Providers or APPs (Physician Assistants and Nurse Practitioners) who all work together to provide you with the care you need, when you need it.  Your next appointment:   1 year(s)  Provider:   Reatha Harps, MD     We recommend  signing up for the patient portal called "MyChart".  Sign up information is provided on this After Visit Summary.  MyChart is used to connect with patients for Virtual Visits (Telemedicine).  Patients are able to view lab/test results, encounter notes, upcoming appointments, etc.  Non-urgent messages can be sent to your provider as well.   To learn more about what you can do with MyChart, go to [email or web address].   Other Instructions       1st Floor: - Lobby - Registration  - Pharmacy  - Lab - Cafe  2nd Floor: - PV Lab - Diagnostic Testing (echo, CT, nuclear med)  3rd Floor: - Vacant  4th Floor: - TCTS (cardiothoracic surgery) - AFib Clinic - Structural Heart Clinic - Vascular Surgery  - Vascular Ultrasound  5th Floor: - HeartCare Cardiology (general and EP) - Clinical Pharmacy for coumadin, hypertension, lipid, weight-loss medications, and med management appointments    Valet parking services will be available as well.

## 2023-09-02 ENCOUNTER — Encounter: Payer: Self-pay | Admitting: Cardiovascular Disease

## 2023-09-06 ENCOUNTER — Other Ambulatory Visit: Payer: Self-pay | Admitting: Orthopaedic Surgery

## 2023-09-06 DIAGNOSIS — M25571 Pain in right ankle and joints of right foot: Secondary | ICD-10-CM

## 2023-09-09 ENCOUNTER — Other Ambulatory Visit: Payer: Self-pay | Admitting: Orthopaedic Surgery

## 2023-09-09 DIAGNOSIS — M25571 Pain in right ankle and joints of right foot: Secondary | ICD-10-CM

## 2023-09-11 ENCOUNTER — Ambulatory Visit
Admission: RE | Admit: 2023-09-11 | Discharge: 2023-09-11 | Disposition: A | Discharge status: Home / Self Care | Source: Ambulatory Visit | Attending: Orthopaedic Surgery | Admitting: Orthopaedic Surgery

## 2023-09-11 ENCOUNTER — Ambulatory Visit: Attending: Nurse Practitioner

## 2023-09-11 DIAGNOSIS — M25571 Pain in right ankle and joints of right foot: Secondary | ICD-10-CM

## 2023-09-11 DIAGNOSIS — I340 Nonrheumatic mitral (valve) insufficiency: Secondary | ICD-10-CM

## 2023-09-11 LAB — ECHOCARDIOGRAM COMPLETE
AR max vel: 3.14 cm2
AV Area VTI: 3.13 cm2
AV Area mean vel: 3.14 cm2
AV Mean grad: 4 mmHg
AV Peak grad: 7.3 mmHg
Ao pk vel: 1.35 m/s
Area-P 1/2: 3.21 cm2
MV VTI: 1.69 cm2

## 2023-09-17 ENCOUNTER — Ambulatory Visit: Payer: Self-pay

## 2023-10-01 ENCOUNTER — Ambulatory Visit: Attending: Nurse Practitioner | Admitting: Nurse Practitioner

## 2023-10-01 ENCOUNTER — Encounter: Payer: Self-pay | Admitting: Nurse Practitioner

## 2023-10-01 VITALS — BP 132/74 | HR 96 | Ht 74.0 in | Wt 260.0 lb

## 2023-10-01 DIAGNOSIS — I340 Nonrheumatic mitral (valve) insufficiency: Secondary | ICD-10-CM

## 2023-10-01 DIAGNOSIS — E782 Mixed hyperlipidemia: Secondary | ICD-10-CM

## 2023-10-01 DIAGNOSIS — Z9889 Other specified postprocedural states: Secondary | ICD-10-CM

## 2023-10-01 DIAGNOSIS — I2510 Atherosclerotic heart disease of native coronary artery without angina pectoris: Secondary | ICD-10-CM

## 2023-10-01 NOTE — Progress Notes (Signed)
 This encounter was created in error - please disregard.

## 2023-11-22 ENCOUNTER — Encounter: Payer: Self-pay | Admitting: Advanced Practice Midwife
